# Patient Record
Sex: Female | Born: 1970
Health system: Southern US, Community
[De-identification: ages and names within clinical notes are randomized; demographics above are authoritative.]

## PROBLEM LIST (undated history)

## (undated) DIAGNOSIS — F329 Major depressive disorder, single episode, unspecified: Secondary | ICD-10-CM

## (undated) DIAGNOSIS — T7840XA Allergy, unspecified, initial encounter: Secondary | ICD-10-CM

## (undated) DIAGNOSIS — I10 Essential (primary) hypertension: Secondary | ICD-10-CM

## (undated) DIAGNOSIS — M79606 Pain in leg, unspecified: Secondary | ICD-10-CM

## (undated) DIAGNOSIS — Z87442 Personal history of urinary calculi: Secondary | ICD-10-CM

## (undated) DIAGNOSIS — N201 Calculus of ureter: Secondary | ICD-10-CM

## (undated) DIAGNOSIS — Z973 Presence of spectacles and contact lenses: Secondary | ICD-10-CM

## (undated) DIAGNOSIS — R350 Frequency of micturition: Secondary | ICD-10-CM

## (undated) DIAGNOSIS — K852 Alcohol induced acute pancreatitis without necrosis or infection: Secondary | ICD-10-CM

## (undated) DIAGNOSIS — J45909 Unspecified asthma, uncomplicated: Secondary | ICD-10-CM

## (undated) DIAGNOSIS — F32A Depression, unspecified: Secondary | ICD-10-CM

## (undated) DIAGNOSIS — R3129 Other microscopic hematuria: Secondary | ICD-10-CM

## (undated) DIAGNOSIS — M1990 Unspecified osteoarthritis, unspecified site: Secondary | ICD-10-CM

## (undated) HISTORY — PX: TUBAL LIGATION: SHX77

---

## 1992-01-17 HISTORY — PX: LAPAROSCOPIC CHOLECYSTECTOMY: SUR755

## 1996-01-17 HISTORY — PX: OTHER SURGICAL HISTORY: SHX169

## 1997-06-21 ENCOUNTER — Inpatient Hospital Stay (HOSPITAL_COMMUNITY): Admission: AD | Admit: 1997-06-21 | Discharge: 1997-06-21 | Payer: Self-pay | Admitting: Obstetrics and Gynecology

## 1997-10-24 ENCOUNTER — Inpatient Hospital Stay (HOSPITAL_COMMUNITY): Admission: AD | Admit: 1997-10-24 | Discharge: 1997-10-26 | Payer: Self-pay | Admitting: Obstetrics and Gynecology

## 1997-10-31 ENCOUNTER — Inpatient Hospital Stay (HOSPITAL_COMMUNITY): Admission: AD | Admit: 1997-10-31 | Discharge: 1997-10-31 | Payer: Self-pay | Admitting: Obstetrics and Gynecology

## 1999-08-08 ENCOUNTER — Encounter: Admission: RE | Admit: 1999-08-08 | Discharge: 1999-08-25 | Payer: Self-pay | Admitting: Family Medicine

## 1999-09-09 ENCOUNTER — Encounter: Admission: RE | Admit: 1999-09-09 | Discharge: 1999-09-09 | Payer: Self-pay | Admitting: Family Medicine

## 1999-09-09 ENCOUNTER — Encounter: Payer: Self-pay | Admitting: Family Medicine

## 1999-09-10 ENCOUNTER — Encounter: Admission: RE | Admit: 1999-09-10 | Discharge: 1999-09-10 | Payer: Self-pay | Admitting: Family Medicine

## 1999-09-10 ENCOUNTER — Encounter: Payer: Self-pay | Admitting: Family Medicine

## 1999-09-13 ENCOUNTER — Encounter: Payer: Self-pay | Admitting: Family Medicine

## 1999-09-13 ENCOUNTER — Encounter: Admission: RE | Admit: 1999-09-13 | Discharge: 1999-09-13 | Payer: Self-pay | Admitting: Family Medicine

## 1999-11-14 ENCOUNTER — Encounter: Payer: Self-pay | Admitting: Family Medicine

## 1999-11-14 ENCOUNTER — Encounter: Admission: RE | Admit: 1999-11-14 | Discharge: 1999-11-14 | Payer: Self-pay | Admitting: Family Medicine

## 2000-02-16 ENCOUNTER — Other Ambulatory Visit: Admission: RE | Admit: 2000-02-16 | Discharge: 2000-02-16 | Payer: Self-pay | Admitting: Family Medicine

## 2001-02-28 ENCOUNTER — Other Ambulatory Visit: Admission: RE | Admit: 2001-02-28 | Discharge: 2001-02-28 | Payer: Self-pay | Admitting: Family Medicine

## 2002-05-22 ENCOUNTER — Encounter: Admission: RE | Admit: 2002-05-22 | Discharge: 2002-05-22 | Payer: Self-pay | Admitting: Family Medicine

## 2002-05-22 ENCOUNTER — Encounter: Payer: Self-pay | Admitting: Family Medicine

## 2003-03-19 ENCOUNTER — Encounter: Admission: RE | Admit: 2003-03-19 | Discharge: 2003-03-19 | Payer: Self-pay | Admitting: Family Medicine

## 2003-03-22 ENCOUNTER — Emergency Department (HOSPITAL_COMMUNITY): Admission: EM | Admit: 2003-03-22 | Discharge: 2003-03-22 | Payer: Self-pay | Admitting: Emergency Medicine

## 2003-04-08 ENCOUNTER — Emergency Department (HOSPITAL_COMMUNITY): Admission: EM | Admit: 2003-04-08 | Discharge: 2003-04-09 | Payer: Self-pay

## 2003-06-30 ENCOUNTER — Other Ambulatory Visit: Admission: RE | Admit: 2003-06-30 | Discharge: 2003-06-30 | Payer: Self-pay | Admitting: Family Medicine

## 2004-06-15 ENCOUNTER — Emergency Department (HOSPITAL_COMMUNITY): Admission: EM | Admit: 2004-06-15 | Discharge: 2004-06-15 | Payer: Self-pay | Admitting: Emergency Medicine

## 2004-06-22 ENCOUNTER — Ambulatory Visit: Payer: Self-pay | Admitting: Internal Medicine

## 2004-06-27 ENCOUNTER — Encounter: Admission: RE | Admit: 2004-06-27 | Discharge: 2004-06-27 | Payer: Self-pay | Admitting: Family Medicine

## 2004-08-02 ENCOUNTER — Other Ambulatory Visit: Admission: RE | Admit: 2004-08-02 | Discharge: 2004-08-02 | Payer: Self-pay | Admitting: Family Medicine

## 2005-01-11 ENCOUNTER — Encounter: Admission: RE | Admit: 2005-01-11 | Discharge: 2005-01-11 | Payer: Self-pay | Admitting: Family Medicine

## 2005-09-11 ENCOUNTER — Other Ambulatory Visit: Admission: RE | Admit: 2005-09-11 | Discharge: 2005-09-11 | Payer: Self-pay | Admitting: Family Medicine

## 2006-01-05 ENCOUNTER — Other Ambulatory Visit (HOSPITAL_COMMUNITY): Admission: RE | Admit: 2006-01-05 | Discharge: 2006-04-05 | Payer: Self-pay | Admitting: Psychiatry

## 2006-01-05 ENCOUNTER — Ambulatory Visit: Payer: Self-pay | Admitting: Psychiatry

## 2006-07-09 ENCOUNTER — Other Ambulatory Visit (HOSPITAL_COMMUNITY): Admission: RE | Admit: 2006-07-09 | Discharge: 2006-10-07 | Payer: Self-pay | Admitting: Psychiatry

## 2006-07-11 ENCOUNTER — Ambulatory Visit: Payer: Self-pay | Admitting: Psychiatry

## 2007-05-29 ENCOUNTER — Other Ambulatory Visit: Admission: RE | Admit: 2007-05-29 | Discharge: 2007-05-29 | Payer: Self-pay | Admitting: Family Medicine

## 2007-06-05 ENCOUNTER — Encounter: Admission: RE | Admit: 2007-06-05 | Discharge: 2007-06-05 | Payer: Self-pay | Admitting: Family Medicine

## 2008-07-24 ENCOUNTER — Other Ambulatory Visit: Admission: RE | Admit: 2008-07-24 | Discharge: 2008-07-24 | Payer: Self-pay | Admitting: Family Medicine

## 2008-09-01 ENCOUNTER — Ambulatory Visit (HOSPITAL_COMMUNITY): Admission: RE | Admit: 2008-09-01 | Discharge: 2008-09-01 | Payer: Self-pay | Admitting: Orthopedic Surgery

## 2008-10-28 ENCOUNTER — Inpatient Hospital Stay (HOSPITAL_COMMUNITY): Admission: RE | Admit: 2008-10-28 | Discharge: 2008-10-31 | Payer: Self-pay | Admitting: *Deleted

## 2008-10-28 HISTORY — PX: POSTERIOR FUSION LUMBAR SPINE: SUR632

## 2009-05-06 ENCOUNTER — Ambulatory Visit (HOSPITAL_COMMUNITY): Admission: RE | Admit: 2009-05-06 | Discharge: 2009-05-06 | Payer: Self-pay | Admitting: Obstetrics and Gynecology

## 2009-05-06 HISTORY — PX: LAPAROSCOPIC TUBAL LIGATION: SUR803

## 2009-08-31 ENCOUNTER — Encounter: Admission: RE | Admit: 2009-08-31 | Discharge: 2009-08-31 | Payer: Self-pay | Admitting: *Deleted

## 2009-10-29 ENCOUNTER — Other Ambulatory Visit: Admission: RE | Admit: 2009-10-29 | Discharge: 2009-10-29 | Payer: Self-pay | Admitting: Family Medicine

## 2010-02-05 ENCOUNTER — Encounter: Payer: Self-pay | Admitting: Family Medicine

## 2010-02-06 ENCOUNTER — Encounter: Payer: Self-pay | Admitting: Orthopedic Surgery

## 2010-04-05 LAB — CBC
HCT: 40.1 % (ref 36.0–46.0)
Hemoglobin: 13.5 g/dL (ref 12.0–15.0)
MCV: 97 fL (ref 78.0–100.0)
Platelets: 225 10*3/uL (ref 150–400)
RBC: 4.13 MIL/uL (ref 3.87–5.11)
WBC: 10.6 10*3/uL — ABNORMAL HIGH (ref 4.0–10.5)

## 2010-04-21 LAB — DIFFERENTIAL
Basophils Relative: 0 % (ref 0–1)
Lymphocytes Relative: 6 % — ABNORMAL LOW (ref 12–46)
Lymphs Abs: 1.2 10*3/uL (ref 0.7–4.0)
Monocytes Relative: 2 % — ABNORMAL LOW (ref 3–12)
Neutro Abs: 19.5 10*3/uL — ABNORMAL HIGH (ref 1.7–7.7)
Neutrophils Relative %: 91 % — ABNORMAL HIGH (ref 43–77)

## 2010-04-21 LAB — BASIC METABOLIC PANEL
BUN: 9 mg/dL (ref 6–23)
BUN: <1 mg/dL — ABNORMAL LOW (ref 6–23)
CO2: 24 mEq/L (ref 19–32)
CO2: 28 mEq/L (ref 19–32)
Chloride: 104 mEq/L (ref 96–112)
Chloride: 105 mEq/L (ref 96–112)
Creatinine, Ser: 0.37 mg/dL — ABNORMAL LOW (ref 0.4–1.2)
Glucose, Bld: 111 mg/dL — ABNORMAL HIGH (ref 70–99)
Glucose, Bld: 81 mg/dL (ref 70–99)
Potassium: 4.4 mEq/L (ref 3.5–5.1)
Sodium: 138 mEq/L (ref 135–145)

## 2010-04-21 LAB — POCT I-STAT 4, (NA,K, GLUC, HGB,HCT)
Hemoglobin: 10.9 g/dL — ABNORMAL LOW (ref 12.0–15.0)
Potassium: 3.9 mEq/L (ref 3.5–5.1)
Sodium: 136 mEq/L (ref 135–145)

## 2010-04-21 LAB — URINE MICROSCOPIC-ADD ON

## 2010-04-21 LAB — URINALYSIS, ROUTINE W REFLEX MICROSCOPIC
Bilirubin Urine: NEGATIVE
Specific Gravity, Urine: 1.013 (ref 1.005–1.030)
Urobilinogen, UA: 1 mg/dL (ref 0.0–1.0)

## 2010-04-21 LAB — TYPE AND SCREEN: Antibody Screen: NEGATIVE

## 2010-04-21 LAB — PROTIME-INR: INR: 0.88 (ref 0.00–1.49)

## 2010-04-21 LAB — CBC: MCHC: 34.3 g/dL (ref 30.0–36.0)

## 2010-08-02 ENCOUNTER — Other Ambulatory Visit: Payer: Self-pay | Admitting: Family Medicine

## 2010-08-02 DIAGNOSIS — Z1231 Encounter for screening mammogram for malignant neoplasm of breast: Secondary | ICD-10-CM

## 2010-08-16 ENCOUNTER — Ambulatory Visit: Payer: Self-pay

## 2010-11-02 ENCOUNTER — Other Ambulatory Visit (HOSPITAL_COMMUNITY)
Admission: RE | Admit: 2010-11-02 | Discharge: 2010-11-02 | Disposition: A | Discharge status: Home / Self Care | Payer: Self-pay | Source: Ambulatory Visit | Attending: Family Medicine | Admitting: Family Medicine

## 2010-11-02 ENCOUNTER — Other Ambulatory Visit: Payer: Self-pay | Admitting: Family Medicine

## 2010-11-02 DIAGNOSIS — Z124 Encounter for screening for malignant neoplasm of cervix: Secondary | ICD-10-CM | POA: Insufficient documentation

## 2011-11-20 ENCOUNTER — Other Ambulatory Visit (HOSPITAL_COMMUNITY)
Admission: RE | Admit: 2011-11-20 | Discharge: 2011-11-20 | Disposition: A | Discharge status: Home / Self Care | Payer: BC Managed Care – PPO | Source: Ambulatory Visit | Attending: Family Medicine | Admitting: Family Medicine

## 2011-11-20 ENCOUNTER — Other Ambulatory Visit: Payer: Self-pay | Admitting: Family Medicine

## 2011-11-20 DIAGNOSIS — Z124 Encounter for screening for malignant neoplasm of cervix: Secondary | ICD-10-CM | POA: Insufficient documentation

## 2012-11-21 ENCOUNTER — Other Ambulatory Visit (HOSPITAL_COMMUNITY)
Admission: RE | Admit: 2012-11-21 | Discharge: 2012-11-21 | Disposition: A | Discharge status: Home / Self Care | Payer: BC Managed Care – PPO | Source: Ambulatory Visit | Attending: Family Medicine | Admitting: Family Medicine

## 2012-11-21 ENCOUNTER — Other Ambulatory Visit: Payer: Self-pay | Admitting: Family Medicine

## 2012-11-21 DIAGNOSIS — Z124 Encounter for screening for malignant neoplasm of cervix: Secondary | ICD-10-CM | POA: Insufficient documentation

## 2012-12-03 ENCOUNTER — Emergency Department (HOSPITAL_COMMUNITY)
Admission: EM | Admit: 2012-12-03 | Discharge: 2012-12-04 | Disposition: A | Discharge status: Home / Self Care | Payer: BC Managed Care – PPO | Attending: Emergency Medicine | Admitting: Emergency Medicine

## 2012-12-03 ENCOUNTER — Emergency Department (HOSPITAL_COMMUNITY): Payer: BC Managed Care – PPO

## 2012-12-03 ENCOUNTER — Encounter (HOSPITAL_COMMUNITY): Payer: Self-pay | Admitting: Emergency Medicine

## 2012-12-03 DIAGNOSIS — Z87891 Personal history of nicotine dependence: Secondary | ICD-10-CM | POA: Insufficient documentation

## 2012-12-03 DIAGNOSIS — Z87442 Personal history of urinary calculi: Secondary | ICD-10-CM | POA: Insufficient documentation

## 2012-12-03 DIAGNOSIS — J45909 Unspecified asthma, uncomplicated: Secondary | ICD-10-CM | POA: Insufficient documentation

## 2012-12-03 DIAGNOSIS — K859 Acute pancreatitis without necrosis or infection, unspecified: Secondary | ICD-10-CM | POA: Insufficient documentation

## 2012-12-03 DIAGNOSIS — R109 Unspecified abdominal pain: Secondary | ICD-10-CM | POA: Insufficient documentation

## 2012-12-03 HISTORY — DX: Unspecified asthma, uncomplicated: J45.909

## 2012-12-03 LAB — CBC
HCT: 40.4 % (ref 36.0–46.0)
MCH: 32.6 pg (ref 26.0–34.0)
MCHC: 35.4 g/dL (ref 30.0–36.0)
Platelets: 314 10*3/uL (ref 150–400)
RBC: 4.39 MIL/uL (ref 3.87–5.11)

## 2012-12-03 LAB — COMPREHENSIVE METABOLIC PANEL
AST: 27 U/L (ref 0–37)
Albumin: 3.9 g/dL (ref 3.5–5.2)
BUN: 12 mg/dL (ref 6–23)
CO2: 24 mEq/L (ref 19–32)
Calcium: 9.4 mg/dL (ref 8.4–10.5)
Creatinine, Ser: 0.47 mg/dL — ABNORMAL LOW (ref 0.50–1.10)
GFR calc non Af Amer: >90 mL/min (ref >90)

## 2012-12-03 LAB — LIPASE, BLOOD: Lipase: 194 U/L — ABNORMAL HIGH (ref 11–59)

## 2012-12-03 LAB — D-DIMER, QUANTITATIVE: D-Dimer, Quant: <0.27 ug/mL-FEU (ref 0.00–0.48)

## 2012-12-03 LAB — POCT I-STAT TROPONIN I: Troponin i, poc: 0 ng/mL (ref 0.00–0.08)

## 2012-12-03 MED ORDER — MORPHINE SULFATE 4 MG/ML IJ SOLN
4.0000 mg | Freq: Once | INTRAMUSCULAR | Status: AC
  Administered 2012-12-03: 4 mg via INTRAVENOUS
  Filled 2012-12-03: qty 1

## 2012-12-03 NOTE — ED Provider Notes (Signed)
CSN: [id number]     Arrival date & time 12/03/12  2204 History   First MD Initiated Contact with Patient 12/03/12 2302     Chief Complaint  Patient presents with  . Chest Pain  . Abdominal Pain   (Consider location/radiation/quality/duration/timing/severity/associated sxs/prior Treatment) Patient is a 42 y.o. female presenting with chest pain and abdominal pain. The history is provided by the patient.  Chest Pain Associated symptoms: abdominal pain   Abdominal Pain Associated symptoms: chest pain   She had onset at about 9 PM of sharp left anterior lower chest pain without radiation. Pain has gradually gotten worse and she now considers it severe. She rates pain at 7/10. Pain is worse with taking a deep breath but not with movement. There is no associated nausea, vomiting, diaphoresis. There is mild dyspnea. Nothing makes the pain any better. She has not taken any medication to try and help it. She has not had pain like this before. She is a former smoker having quit 10 months ago. There is no history of hypertension, diabetes, hyperlipidemia. There's no family history of premature coronary atherosclerosis. She denies recent surgery, long distance travel. She denies any leg pain or swelling.  Past Medical History  Diagnosis Date  . Asthma   . Kidney stone    Past Surgical History  Procedure Laterality Date  . Cholecystectomy    . Back surgery    . Kidney stone surgery     History reviewed. No pertinent family history. History  Substance Use Topics  . Smoking status: Former Smoker    Quit date: 02/03/2012  . Smokeless tobacco: Not on file  . Alcohol Use: Yes     Comment: 12 oz beer per day   OB History   Grav Para Term Preterm Abortions TAB SAB Ect Mult Living                 Review of Systems  Cardiovascular: Positive for chest pain.  Gastrointestinal: Positive for abdominal pain.  All other systems reviewed and are negative.    Allergies  Aspirin and  Ibuprofen  Home Medications  No current outpatient prescriptions on file. BP 146/102  Pulse 87  Temp(Src) 97.5 F (36.4 C) (Oral)  Resp 23  Ht 5' 1.5" (1.562 m)  Wt 138 lb 6 oz (62.766 kg)  BMI 25.73 kg/m2  SpO2 94% Physical Exam  Nursing note and vitals reviewed.  42 year old female, who is emotionally labile, but is in no acute distress. Vital signs are significant for tachypnea with respiratory rate of 23, and hypertension with blood pressure 146/102. Oxygen saturation is 94%, which is normal. Head is normocephalic and atraumatic. PERRLA, EOMI. Oropharynx is clear. Neck is nontender and supple without adenopathy or JVD. Back is nontender and there is no CVA tenderness. Lungs are clear without rales, wheezes, or rhonchi. Chest is nontender. Heart has regular rate and rhythm without murmur. Abdomen is soft, flat, nontender without masses or hepatosplenomegaly and peristalsis is normoactive. Extremities have no cyanosis or edema, full range of motion is present. Skin is warm and dry without rash. Neurologic: Mental status is normal, cranial nerves are intact, there are no motor or sensory deficits.  ED Course  Procedures (including critical care time) Labs Review Results for orders placed during the hospital encounter of 12/03/12  CBC      Result Value Range   WBC 10.2  4.0 - 10.5 K/uL   RBC 4.39  3.87 - 5.11 MIL/uL   Hemoglobin 14.3  12.0 - 15.0 g/dL   HCT 62.1  30.8 - 65.7 %   MCV 92.0  78.0 - 100.0 fL   MCH 32.6  26.0 - 34.0 pg   MCHC 35.4  30.0 - 36.0 g/dL   RDW 84.6  96.2 - 95.2 %   Platelets 314  150 - 400 K/uL  LIPASE, BLOOD      Result Value Range   Lipase 194 (*) 11 - 59 U/L  COMPREHENSIVE METABOLIC PANEL      Result Value Range   Sodium 141  135 - 145 mEq/L   Potassium 3.6  3.5 - 5.1 mEq/L   Chloride 107  96 - 112 mEq/L   CO2 24  19 - 32 mEq/L   Glucose, Bld 77  70 - 99 mg/dL   BUN 12  6 - 23 mg/dL   Creatinine, Ser 8.41 (*) 0.50 - 1.10 mg/dL   Calcium  9.4  8.4 - 32.4 mg/dL   Total Protein 7.5  6.0 - 8.3 g/dL   Albumin 3.9  3.5 - 5.2 g/dL   AST 27  0 - 37 U/L   ALT 15  0 - 35 U/L   Alkaline Phosphatase 62  39 - 117 U/L   Total Bilirubin 0.2 (*) 0.3 - 1.2 mg/dL   GFR calc non Af Amer >90  >90 mL/min   GFR calc Af Amer >90  >90 mL/min  D-DIMER, QUANTITATIVE      Result Value Range   D-Dimer, Quant <0.27  0.00 - 0.48 ug/mL-FEU  POCT I-STAT TROPONIN I      Result Value Range   Troponin i, poc 0.00  0.00 - 0.08 ng/mL   Comment 3            Imaging Review Dg Chest 2 View  12/03/2012   CLINICAL DATA:  Chest and abdominal pain  EXAM: CHEST  2 VIEW  COMPARISON:  10/28/2008  FINDINGS: The heart size and mediastinal contours are within normal limits. Both lungs are clear. The visualized skeletal structures are unremarkable. Cholecystectomy changes.  IMPRESSION: No active cardiopulmonary disease.   Electronically Signed   By: Tiburcio Pea M.D.   On: 12/03/2012 23:47    EKG Interpretation    Date/Time:  Tuesday December 03 2012 22:11:09 EST Ventricular Rate:  80 PR Interval:  146 QRS Duration: 92 QT Interval:  394 QTC Calculation: 454 R Axis:   76 Text Interpretation:  Normal sinus rhythm Left ventricular hypertrophy Abnormal ECG No significant change since last tracing Confirmed by [facility]  MD, Jennipher Weatherholtz ([id number]) on 12/03/2012 11:03:43 PM            MDM   1. Pancreatitis    Pleuritic chest pain of uncertain cause. Chest x-ray and screening labs will be obtained. She is aspirin and NSAIDs allergic associates not given aspirin or ketorolac. She's given morphine for pain. ECG shows no evidence of ischemia and she has no significant cardiac risk factors. She also has no risk factors for DVT or pulmonary embolism. Past records are reviewed and she has no relevant past visits.  Workup has come back significant for elevated lipase. Patient is to drinking 3 or 4 times per week and consuming 3 or 4 drinks each time. D-dimer has come back  normal as well as chest x-ray and remaining laboratory with workup. Cause of pain seems to be alcoholic pancreatitis. This was explained to the patient. She got moderate relief of pain with morphine. She'll be given a dose of  hydromorphone. Since she is not having significant nausea, I'm hoping to send her home with appropriate analgesics.  She got good relief of pain with morphine. She's not had any difficulty with nausea. She is discharged with prescriptions for pantoprazole, ondansetron, and oxycodone-acetaminophen. She is advised to abstain from alcohol.  Dione Booze, MD 12/04/12 [phone]

## 2012-12-03 NOTE — ED Notes (Signed)
MD at bedside. 

## 2012-12-03 NOTE — ED Notes (Addendum)
Reports sudden L sided CP and abd starting 20 minutes ago; denies n/v; " a little bit" of SOB; denies diaphoresis; pt reports drinking liquor today; pts breath smells of etoh

## 2012-12-04 MED ORDER — ONDANSETRON HCL 4 MG/2ML IJ SOLN
4.0000 mg | Freq: Once | INTRAMUSCULAR | Status: AC
  Administered 2012-12-04: 4 mg via INTRAVENOUS
  Filled 2012-12-04: qty 2

## 2012-12-04 MED ORDER — PANTOPRAZOLE SODIUM 40 MG PO TBEC
40.0000 mg | DELAYED_RELEASE_TABLET | Freq: Once | ORAL | Status: AC
  Administered 2012-12-04: 40 mg via ORAL
  Filled 2012-12-04: qty 1

## 2012-12-04 MED ORDER — OXYCODONE-ACETAMINOPHEN 5-325 MG PO TABS: 1.0000 | ORAL_TABLET | ORAL | Status: DC | PRN

## 2012-12-04 MED ORDER — ALBUTEROL SULFATE (5 MG/ML) 0.5% IN NEBU
5.0000 mg | INHALATION_SOLUTION | Freq: Once | RESPIRATORY_TRACT | Status: AC
  Administered 2012-12-04: 5 mg via RESPIRATORY_TRACT
  Filled 2012-12-04: qty 1

## 2012-12-04 MED ORDER — HYDROMORPHONE HCL PF 1 MG/ML IJ SOLN
1.0000 mg | Freq: Once | INTRAMUSCULAR | Status: AC
  Administered 2012-12-04: 1 mg via INTRAVENOUS
  Filled 2012-12-04: qty 1

## 2012-12-04 MED ORDER — PANTOPRAZOLE SODIUM 40 MG PO TBEC: 40.0000 mg | DELAYED_RELEASE_TABLET | Freq: Every day | ORAL | Status: DC

## 2012-12-04 MED ORDER — ONDANSETRON HCL 4 MG PO TABS: 4.0000 mg | ORAL_TABLET | Freq: Four times a day (QID) | ORAL | Status: DC | PRN

## 2012-12-04 MED ORDER — IPRATROPIUM BROMIDE 0.02 % IN SOLN
0.5000 mg | Freq: Once | RESPIRATORY_TRACT | Status: AC
  Administered 2012-12-04: 0.5 mg via RESPIRATORY_TRACT
  Filled 2012-12-04: qty 2.5

## 2012-12-06 ENCOUNTER — Other Ambulatory Visit: Payer: Self-pay | Admitting: Family Medicine

## 2012-12-06 DIAGNOSIS — R1011 Right upper quadrant pain: Secondary | ICD-10-CM

## 2012-12-11 ENCOUNTER — Inpatient Hospital Stay: Admission: RE | Admit: 2012-12-11 | Payer: BC Managed Care – PPO | Source: Ambulatory Visit

## 2012-12-16 ENCOUNTER — Other Ambulatory Visit: Payer: BC Managed Care – PPO

## 2012-12-18 ENCOUNTER — Ambulatory Visit
Admission: RE | Admit: 2012-12-18 | Discharge: 2012-12-18 | Disposition: A | Discharge status: Home / Self Care | Payer: BC Managed Care – PPO | Source: Ambulatory Visit | Attending: Family Medicine | Admitting: Family Medicine

## 2012-12-18 DIAGNOSIS — R1011 Right upper quadrant pain: Secondary | ICD-10-CM

## 2013-03-24 ENCOUNTER — Other Ambulatory Visit (HOSPITAL_COMMUNITY): Payer: Self-pay | Admitting: Family Medicine

## 2013-03-24 DIAGNOSIS — J45909 Unspecified asthma, uncomplicated: Secondary | ICD-10-CM

## 2013-03-24 LAB — PULMONARY FUNCTION TEST
DL/VA % pred: 115 %
DL/VA: 5.24 ml/min/mmHg/L
DLCO COR % PRED: 114 %
DLCO COR: 24.61 ml/min/mmHg
DLCO UNC % PRED: 114 %
DLCO UNC: 24.61 ml/min/mmHg
FEF 25-75 POST: 3.03 L/s
FEF 25-75 Pre: 1.86 L/sec
FEF2575-%Change-Post: 62 %
FEF2575-%PRED-PRE: 64 %
FEF2575-%Pred-Post: 105 %
FEV1-%Change-Post: 12 %
FEV1-%Pred-Post: 103 %
FEV1-%Pred-Pre: 91 %
FEV1-POST: 2.84 L
FEV1-PRE: 2.52 L
FEV1FVC-%Change-Post: 12 %
FEV1FVC-%PRED-PRE: 88 %
FEV6-%CHANGE-POST: 0 %
FEV6-%Pred-Post: 105 %
FEV6-%Pred-Pre: 104 %
FEV6-POST: 3.51 L
FEV6-Pre: 3.49 L
FEV6FVC-%CHANGE-POST: 0 %
FEV6FVC-%Pred-Post: 102 %
FEV6FVC-%Pred-Pre: 101 %
FVC-%CHANGE-POST: 0 %
FVC-%PRED-POST: 103 %
FVC-%Pred-Pre: 103 %
FVC-PRE: 3.51 L
FVC-Post: 3.51 L
PRE FEV1/FVC RATIO: 72 %
Post FEV1/FVC ratio: 81 %
Post FEV6/FVC ratio: 100 %
Pre FEV6/FVC Ratio: 99 %
RV % pred: 101 %
RV: 1.6 L
TLC % pred: 104 %
TLC: 4.96 L

## 2013-04-01 ENCOUNTER — Encounter (HOSPITAL_COMMUNITY): Payer: BC Managed Care – PPO

## 2013-04-04 ENCOUNTER — Encounter (HOSPITAL_COMMUNITY): Payer: BC Managed Care – PPO

## 2013-04-09 ENCOUNTER — Ambulatory Visit (HOSPITAL_COMMUNITY)
Admission: RE | Admit: 2013-04-09 | Discharge: 2013-04-09 | Disposition: A | Discharge status: Home / Self Care | Payer: BC Managed Care – PPO | Source: Ambulatory Visit | Attending: Family Medicine | Admitting: Family Medicine

## 2013-04-09 DIAGNOSIS — J45909 Unspecified asthma, uncomplicated: Secondary | ICD-10-CM | POA: Insufficient documentation

## 2013-04-09 MED ORDER — ALBUTEROL SULFATE (2.5 MG/3ML) 0.083% IN NEBU
2.5000 mg | INHALATION_SOLUTION | Freq: Once | RESPIRATORY_TRACT | Status: AC
  Administered 2013-04-09: 2.5 mg via RESPIRATORY_TRACT

## 2014-11-26 ENCOUNTER — Other Ambulatory Visit: Payer: Self-pay

## 2014-11-26 DIAGNOSIS — Z1231 Encounter for screening mammogram for malignant neoplasm of breast: Secondary | ICD-10-CM

## 2014-12-16 ENCOUNTER — Ambulatory Visit
Admission: RE | Admit: 2014-12-16 | Discharge: 2014-12-16 | Disposition: A | Discharge status: Home / Self Care | Payer: BLUE CROSS/BLUE SHIELD | Source: Ambulatory Visit

## 2014-12-16 DIAGNOSIS — Z1231 Encounter for screening mammogram for malignant neoplasm of breast: Secondary | ICD-10-CM

## 2014-12-21 ENCOUNTER — Other Ambulatory Visit: Payer: Self-pay | Admitting: Family Medicine

## 2014-12-21 DIAGNOSIS — R928 Other abnormal and inconclusive findings on diagnostic imaging of breast: Secondary | ICD-10-CM

## 2014-12-25 ENCOUNTER — Other Ambulatory Visit: Payer: BLUE CROSS/BLUE SHIELD

## 2014-12-28 ENCOUNTER — Other Ambulatory Visit: Payer: Self-pay | Admitting: Family Medicine

## 2014-12-28 ENCOUNTER — Ambulatory Visit
Admission: RE | Admit: 2014-12-28 | Discharge: 2014-12-28 | Disposition: A | Discharge status: Home / Self Care | Payer: BLUE CROSS/BLUE SHIELD | Source: Ambulatory Visit | Attending: Family Medicine | Admitting: Family Medicine

## 2014-12-28 DIAGNOSIS — R928 Other abnormal and inconclusive findings on diagnostic imaging of breast: Secondary | ICD-10-CM

## 2014-12-29 ENCOUNTER — Other Ambulatory Visit: Payer: Self-pay | Admitting: Family Medicine

## 2014-12-29 DIAGNOSIS — R928 Other abnormal and inconclusive findings on diagnostic imaging of breast: Secondary | ICD-10-CM

## 2015-01-01 ENCOUNTER — Ambulatory Visit
Admission: RE | Admit: 2015-01-01 | Discharge: 2015-01-01 | Disposition: A | Discharge status: Home / Self Care | Payer: BLUE CROSS/BLUE SHIELD | Source: Ambulatory Visit | Attending: Family Medicine | Admitting: Family Medicine

## 2015-01-01 DIAGNOSIS — R928 Other abnormal and inconclusive findings on diagnostic imaging of breast: Secondary | ICD-10-CM

## 2015-02-04 ENCOUNTER — Other Ambulatory Visit: Payer: Self-pay | Admitting: Family Medicine

## 2015-02-04 ENCOUNTER — Ambulatory Visit
Admission: RE | Admit: 2015-02-04 | Discharge: 2015-02-04 | Disposition: A | Discharge status: Home / Self Care | Payer: BLUE CROSS/BLUE SHIELD | Source: Ambulatory Visit | Attending: Family Medicine | Admitting: Family Medicine

## 2015-02-04 DIAGNOSIS — R1031 Right lower quadrant pain: Secondary | ICD-10-CM

## 2015-02-09 ENCOUNTER — Encounter (HOSPITAL_BASED_OUTPATIENT_CLINIC_OR_DEPARTMENT_OTHER): Payer: Self-pay | Admitting: *Deleted

## 2015-02-09 ENCOUNTER — Other Ambulatory Visit: Payer: Self-pay | Admitting: Urology

## 2015-02-09 NOTE — Progress Notes (Signed)
NPO AFTER MN W/ EXCEPTION CLEAR LIQUIDS UNTIL 0700 (NO CREAM /MILK PRODUCTS).   ARRIVE AT 1130.  NEEDS HG .   WILL TAKE AM MEDS AND DO SYMBICORT INHALER AM DOS W/ SIPS OF WATER AND IF NEEDED TAKE NORCO.

## 2015-02-11 NOTE — H&P (Signed)
Reason For Visit Kidney stone   Active Problems Problems  1. Calculus of kidney (N20.0) 2. Microscopic hematuria (R31.29) 3. Right ureteral stone (N20.1)  History of Present Illness     2nd stone event for this 45 yo female, referred by Dr. Clovis Riley for further evaluation of kidney stones.    She noted 1 month intermittant having Rt flank pain, with intense pain yesterday, urgency, frequency ( 1 week) & N/V with a hx of kidney stones. She had a CT 02/04/15 that showed: 6 mm Rt distal ureteral stone with moderate hydronephrosis, 2 mm Rt renal stone and a 2 mm Lt renal stone.    Unknown what type of kidney stones she had in the past. Sodas: Dt. Pepsi: 5/day, 16 oz ea. No tea.   Fast foods: 3-4x/week.   Family hx. Mother ++ stones.  Tobacco: 27pk/ yr tobacco.   Surgical History Problems  1. History of Back Surgery 2. History of Cholecystectomy 3. History of Cystoscopy With Manipulation Of Ureteral Calculus  Current Meds 1. Hydrocodone-Acetaminophen CAPS;  Therapy: (Recorded:20Jan2017) to Recorded 2. PROzac 40 MG Oral Capsule;  Therapy: (Recorded:20Jan2017) to Recorded 3. Singulair TABS;  Therapy: (Recorded:20Jan2017) to Recorded 4. Symbicort AERO;  Therapy: (Recorded:20Jan2017) to Recorded 5. ZyrTEC Allergy 10 MG Oral Tablet;  Therapy: (Recorded:20Jan2017) to Recorded  Allergies Medication  1. Aspirin TABS 2. Ibuprofen TABS  Family History Problems  1. Family history of cardiac disorder (Z82.49) : Mother 2. Family history of diabetes mellitus (Z83.3) : Mother 3. Family history of hypertension (Z82.49) : Father, Sister  Social History Problems    Alcohol use (Z78.9)   Caffeine use (F15.90)   Current every day smoker (F17.200)   Housewife or homemaker   Number of children   Smokes cigarettes (F17.210)  Review of Systems Genitourinary, constitutional, skin, eye, otolaryngeal, hematologic/lymphatic, cardiovascular, pulmonary, endocrine, musculoskeletal,  gastrointestinal, neurological and psychiatric system(s) were reviewed and pertinent findings if present are noted and are otherwise negative.  Genitourinary: urinary frequency and urinary urgency.  Gastrointestinal: nausea, vomiting and flank pain.    Vitals Vital Signs [Data Includes: Last 1 Day]  Recorded: 20Jan2017 01:46PM  Height: 5 ft 1.5 in Weight: 134 lb  BMI Calculated: 24.91 BSA Calculated: 1.6 Blood Pressure: 161 / 84 Temperature: 97.5 F Heart Rate: 84  Physical Exam Constitutional: Well nourished and well developed . No acute distress.  ENT:. The ears and nose are normal in appearance.  Neck: The appearance of the neck is normal and no neck mass is present.  Pulmonary: No respiratory distress and normal respiratory rhythm and effort.  Cardiovascular: Heart rate and rhythm are normal . No peripheral edema.  Abdomen: The abdomen is soft and nontender. No masses are palpated. Mild. mild right CVA tenderness no CVA tenderness. No hernias are palpable. No hepatosplenomegaly noted.  Genitourinary:  Chaperone Present: .  Examination of the external genitalia shows normal female external genitalia and no lesions. The urethra is normal in appearance and not tender. There is no urethral mass. Vaginal exam demonstrates no abnormalities. The adnexa are palpably normal. The bladder is non tender and not distended. The anus is normal on inspection. The perineum is normal on inspection.  Lymphatics: The femoral and inguinal nodes are not enlarged or tender.  Skin: Normal skin turgor, no visible rash and no visible skin lesions.  Neuro/Psych:. Mood and affect are appropriate.    Results/Data  Selected Results  KUB 20Jan2017 01:56PM Jethro Bolus   Test Name Result Flag Reference  KUB  Diagnostic Images Are Available In PACS For This Exam.    05 Feb 2015 1:19 PM  UA With REFLEX    COLOR YELLOW     APPEARANCE CLEAR     SPECIFIC GRAVITY 1.025     pH 6.0     GLUCOSE  NEGATIVE     BILIRUBIN NEGATIVE     KETONE NEGATIVE     BLOOD 2+     PROTEIN NEGATIVE     NITRITE NEGATIVE     LEUKOCYTE ESTERASE NEGATIVE     WBC 0-5     RBC 3-10     SQUAMOUS EPITHELIAL/HPF 0-5     BACTERIA NONE SEEN     CRYSTALS NONE SEEN     CASTS NONE SEEN     Yeast NONE SEEN  Assessment Assessed  1. Microscopic hematuria (R31.29) 2. Right ureteral stone (N20.1) 3. Calculus of kidney (N20.0)  Mrs Cambre has a 6mm distal stone, without infection or gross hematuria, or colic at this time. She has mild hydronephrosis. She has pain med at home. Cannot take Tylenol or Nsaid ( anaphylaxis). She will strain urine and RTC next week for re-ck. We have discussed elective basket extraction of stone if she does not pass her stone.   Plan Calculus of kidney  1. KUB; Status:Resulted - Requires Verification;   Done: 20Jan2017 01:56PM  Elective stone extraction.   Return to [facility] ED if necessary over weekend for surgery for stone.   Discussion/Summary cc: Lupe Carney, MD     Signatures Electronically signed by : Jethro Bolus, M.D.; Feb 05 2015  3:33PM EST

## 2015-02-12 ENCOUNTER — Encounter (HOSPITAL_BASED_OUTPATIENT_CLINIC_OR_DEPARTMENT_OTHER): Payer: Self-pay | Admitting: *Deleted

## 2015-02-12 ENCOUNTER — Encounter (HOSPITAL_BASED_OUTPATIENT_CLINIC_OR_DEPARTMENT_OTHER)
Admission: RE | Disposition: A | Discharge status: Home / Self Care | Payer: Self-pay | Source: Ambulatory Visit | Attending: Urology

## 2015-02-12 ENCOUNTER — Ambulatory Visit (HOSPITAL_BASED_OUTPATIENT_CLINIC_OR_DEPARTMENT_OTHER): Payer: BLUE CROSS/BLUE SHIELD | Admitting: Certified Registered"

## 2015-02-12 ENCOUNTER — Ambulatory Visit (HOSPITAL_BASED_OUTPATIENT_CLINIC_OR_DEPARTMENT_OTHER)
Admission: RE | Admit: 2015-02-12 | Discharge: 2015-02-12 | Disposition: A | Discharge status: Home / Self Care | Payer: BLUE CROSS/BLUE SHIELD | Source: Ambulatory Visit | Attending: Urology | Admitting: Urology

## 2015-02-12 DIAGNOSIS — Z7982 Long term (current) use of aspirin: Secondary | ICD-10-CM | POA: Diagnosis not present

## 2015-02-12 DIAGNOSIS — F17210 Nicotine dependence, cigarettes, uncomplicated: Secondary | ICD-10-CM | POA: Insufficient documentation

## 2015-02-12 DIAGNOSIS — Z9049 Acquired absence of other specified parts of digestive tract: Secondary | ICD-10-CM | POA: Diagnosis not present

## 2015-02-12 DIAGNOSIS — J45909 Unspecified asthma, uncomplicated: Secondary | ICD-10-CM | POA: Diagnosis not present

## 2015-02-12 DIAGNOSIS — Z79899 Other long term (current) drug therapy: Secondary | ICD-10-CM | POA: Diagnosis not present

## 2015-02-12 DIAGNOSIS — N132 Hydronephrosis with renal and ureteral calculous obstruction: Secondary | ICD-10-CM | POA: Diagnosis present

## 2015-02-12 DIAGNOSIS — N201 Calculus of ureter: Secondary | ICD-10-CM

## 2015-02-12 DIAGNOSIS — M1990 Unspecified osteoarthritis, unspecified site: Secondary | ICD-10-CM | POA: Diagnosis not present

## 2015-02-12 HISTORY — DX: Frequency of micturition: R35.0

## 2015-02-12 HISTORY — DX: Personal history of urinary calculi: Z87.442

## 2015-02-12 HISTORY — DX: Major depressive disorder, single episode, unspecified: F32.9

## 2015-02-12 HISTORY — DX: Calculus of ureter: N20.1

## 2015-02-12 HISTORY — DX: Depression, unspecified: F32.A

## 2015-02-12 HISTORY — DX: Presence of spectacles and contact lenses: Z97.3

## 2015-02-12 HISTORY — DX: Other microscopic hematuria: R31.29

## 2015-02-12 HISTORY — DX: Unspecified osteoarthritis, unspecified site: M19.90

## 2015-02-12 HISTORY — PX: CYSTOSCOPY WITH RETROGRADE PYELOGRAM, URETEROSCOPY AND STENT PLACEMENT: SHX5789

## 2015-02-12 HISTORY — PX: STONE EXTRACTION WITH BASKET: SHX5318

## 2015-02-12 LAB — POCT HEMOGLOBIN-HEMACUE: Hemoglobin: 13.6 g/dL (ref 12.0–15.0)

## 2015-02-12 SURGERY — CYSTOURETEROSCOPY, WITH RETROGRADE PYELOGRAM AND STENT INSERTION
Anesthesia: General | Laterality: Right

## 2015-02-12 MED ORDER — HYOSCYAMINE SULFATE 0.125 MG SL SUBL
SUBLINGUAL_TABLET | SUBLINGUAL | Status: AC
  Filled 2015-02-12: qty 1

## 2015-02-12 MED ORDER — CEFAZOLIN SODIUM-DEXTROSE 2-3 GM-% IV SOLR
INTRAVENOUS | Status: AC
  Filled 2015-02-12: qty 50

## 2015-02-12 MED ORDER — PROMETHAZINE HCL 25 MG/ML IJ SOLN
6.2500 mg | INTRAMUSCULAR | Status: DC | PRN
  Filled 2015-02-12: qty 1

## 2015-02-12 MED ORDER — ONDANSETRON HCL 4 MG/2ML IJ SOLN
INTRAMUSCULAR | Status: DC | PRN
  Administered 2015-02-12: 4 mg via INTRAVENOUS

## 2015-02-12 MED ORDER — MIDAZOLAM HCL 2 MG/2ML IJ SOLN
INTRAMUSCULAR | Status: AC
  Filled 2015-02-12: qty 2

## 2015-02-12 MED ORDER — PROPOFOL 10 MG/ML IV BOLUS
INTRAVENOUS | Status: AC
  Filled 2015-02-12: qty 20

## 2015-02-12 MED ORDER — OXYCODONE HCL 5 MG PO TABS
5.0000 mg | ORAL_TABLET | Freq: Once | ORAL | Status: AC
  Administered 2015-02-12: 5 mg via ORAL
  Filled 2015-02-12: qty 1

## 2015-02-12 MED ORDER — MIDAZOLAM HCL 5 MG/5ML IJ SOLN
INTRAMUSCULAR | Status: DC | PRN
  Administered 2015-02-12: 2 mg via INTRAVENOUS

## 2015-02-12 MED ORDER — LIDOCAINE HCL (CARDIAC) 20 MG/ML IV SOLN
INTRAVENOUS | Status: AC
  Filled 2015-02-12: qty 5

## 2015-02-12 MED ORDER — PHENAZOPYRIDINE HCL 100 MG PO TABS
ORAL_TABLET | ORAL | Status: AC
  Filled 2015-02-12: qty 2

## 2015-02-12 MED ORDER — IOHEXOL 350 MG/ML SOLN
INTRAVENOUS | Status: DC | PRN
  Administered 2015-02-12: 5 mL via URETHRAL

## 2015-02-12 MED ORDER — PROPOFOL 10 MG/ML IV BOLUS
INTRAVENOUS | Status: DC | PRN
  Administered 2015-02-12: 180 mg via INTRAVENOUS

## 2015-02-12 MED ORDER — DEXAMETHASONE SODIUM PHOSPHATE 4 MG/ML IJ SOLN
INTRAMUSCULAR | Status: DC | PRN
  Administered 2015-02-12: 8 mg via INTRAVENOUS

## 2015-02-12 MED ORDER — HYOSCYAMINE SULFATE 0.125 MG SL SUBL
0.1250 mg | SUBLINGUAL_TABLET | Freq: Once | SUBLINGUAL | Status: AC
  Administered 2015-02-12: 0.125 mg via SUBLINGUAL
  Filled 2015-02-12: qty 1

## 2015-02-12 MED ORDER — DEXAMETHASONE SODIUM PHOSPHATE 10 MG/ML IJ SOLN
INTRAMUSCULAR | Status: AC
  Filled 2015-02-12: qty 1

## 2015-02-12 MED ORDER — HYDROMORPHONE HCL 1 MG/ML IJ SOLN
0.2500 mg | INTRAMUSCULAR | Status: DC | PRN
  Administered 2015-02-12 (×4): 0.5 mg via INTRAVENOUS
  Filled 2015-02-12: qty 1

## 2015-02-12 MED ORDER — FENTANYL CITRATE (PF) 100 MCG/2ML IJ SOLN
INTRAMUSCULAR | Status: DC | PRN
Start: 2015-02-12 — End: 2015-02-12
  Administered 2015-02-12 (×2): 50 ug via INTRAVENOUS

## 2015-02-12 MED ORDER — FENTANYL CITRATE (PF) 100 MCG/2ML IJ SOLN
INTRAMUSCULAR | Status: AC
  Filled 2015-02-12: qty 2

## 2015-02-12 MED ORDER — HYDROMORPHONE HCL 1 MG/ML IJ SOLN
INTRAMUSCULAR | Status: AC
  Filled 2015-02-12: qty 1

## 2015-02-12 MED ORDER — CEFAZOLIN SODIUM-DEXTROSE 2-3 GM-% IV SOLR
2.0000 g | INTRAVENOUS | Status: AC
  Administered 2015-02-12: 2 g via INTRAVENOUS
  Filled 2015-02-12: qty 50

## 2015-02-12 MED ORDER — ACETAMINOPHEN 10 MG/ML IV SOLN
INTRAVENOUS | Status: AC
  Filled 2015-02-12: qty 100

## 2015-02-12 MED ORDER — LACTATED RINGERS IV SOLN
INTRAVENOUS | Status: DC
  Administered 2015-02-12 (×2): via INTRAVENOUS
  Filled 2015-02-12: qty 1000

## 2015-02-12 MED ORDER — TRAMADOL-ACETAMINOPHEN 37.5-325 MG PO TABS: 1.0000 | ORAL_TABLET | Freq: Four times a day (QID) | ORAL | Status: DC | PRN

## 2015-02-12 MED ORDER — LIDOCAINE HCL (CARDIAC) 20 MG/ML IV SOLN
INTRAVENOUS | Status: DC | PRN
  Administered 2015-02-12: 60 mg via INTRAVENOUS

## 2015-02-12 MED ORDER — PHENAZOPYRIDINE HCL 200 MG PO TABS: 200.0000 mg | ORAL_TABLET | Freq: Three times a day (TID) | ORAL | Status: DC | PRN

## 2015-02-12 MED ORDER — BELLADONNA ALKALOIDS-OPIUM 16.2-60 MG RE SUPP
RECTAL | Status: AC
  Filled 2015-02-12: qty 1

## 2015-02-12 MED ORDER — HYOSCYAMINE SULFATE 0.125 MG SL SUBL
0.1250 mg | SUBLINGUAL_TABLET | SUBLINGUAL | Status: DC | PRN
Start: 2015-02-12 — End: 2017-08-16

## 2015-02-12 MED ORDER — PHENAZOPYRIDINE HCL 200 MG PO TABS
200.0000 mg | ORAL_TABLET | Freq: Once | ORAL | Status: AC
  Administered 2015-02-12: 200 mg via ORAL
  Filled 2015-02-12: qty 1

## 2015-02-12 MED ORDER — ONDANSETRON HCL 4 MG/2ML IJ SOLN
INTRAMUSCULAR | Status: AC
  Filled 2015-02-12: qty 2

## 2015-02-12 MED ORDER — OXYCODONE HCL 5 MG PO TABS
ORAL_TABLET | ORAL | Status: AC
  Filled 2015-02-12: qty 1

## 2015-02-12 MED FILL — OSCIMIN SL 0.125 MG TABLET: 0.125 | 5 days supply | Qty: 30 | Fill #0

## 2015-02-12 SURGICAL SUPPLY — 39 items
ADAPTER CATH URET PLST 4-6FR (CATHETERS) IMPLANT
ADPR CATH URET STRL DISP 4-6FR (CATHETERS)
BAG DRAIN URO-CYSTO SKYTR STRL (DRAIN) ×2 IMPLANT
BAG DRN UROCATH (DRAIN) ×1
BASKET LASER NITINOL 1.9FR (BASKET) IMPLANT
BASKET STNLS GEMINI 4WIRE 3FR (BASKET) IMPLANT
BASKET ZERO TIP NITINOL 2.4FR (BASKET) ×2 IMPLANT
BOOTIES KNEE HIGH SLOAN (MISCELLANEOUS) ×2 IMPLANT
BSKT STON RTRVL 120 1.9FR (BASKET)
BSKT STON RTRVL GEM 120X11 3FR (BASKET)
BSKT STON RTRVL ZERO TP 2.4FR (BASKET) ×2
CANISTER SUCT LVC 12 LTR MEDI- (MISCELLANEOUS) IMPLANT
CATH CLEAR GEL 3F BACKSTOP (CATHETERS) IMPLANT
CATH INTERMIT  6FR 70CM (CATHETERS) IMPLANT
CATH URET 5FR 28IN CONE TIP (BALLOONS)
CATH URET 5FR 28IN OPEN ENDED (CATHETERS) IMPLANT
CATH URET 5FR 70CM CONE TIP (BALLOONS) IMPLANT
CATH URET DUAL LUMEN 6-10FR 50 (CATHETERS) IMPLANT
CLOTH BEACON ORANGE TIMEOUT ST (SAFETY) ×2 IMPLANT
ELECT REM PT RETURN 9FT ADLT (ELECTROSURGICAL)
ELECTRODE REM PT RTRN 9FT ADLT (ELECTROSURGICAL) IMPLANT
GLOVE BIO SURGEON STRL SZ7.5 (GLOVE) ×2 IMPLANT
GOWN STRL REUS W/ TWL LRG LVL3 (GOWN DISPOSABLE) ×1 IMPLANT
GOWN STRL REUS W/ TWL XL LVL3 (GOWN DISPOSABLE) ×1 IMPLANT
GOWN STRL REUS W/TWL LRG LVL3 (GOWN DISPOSABLE) ×2
GOWN STRL REUS W/TWL XL LVL3 (GOWN DISPOSABLE) ×2
GUIDEWIRE 0.038 PTFE COATED (WIRE) IMPLANT
GUIDEWIRE ANG ZIPWIRE 038X150 (WIRE) IMPLANT
GUIDEWIRE STR DUAL SENSOR (WIRE) IMPLANT
IV NS IRRIG 3000ML ARTHROMATIC (IV SOLUTION) ×4 IMPLANT
KIT BALLIN UROMAX 15FX10 (LABEL) IMPLANT
KIT BALLN UROMAX 15FX4 (MISCELLANEOUS) IMPLANT
KIT BALLN UROMAX 26 75X4 (MISCELLANEOUS)
KIT ROOM TURNOVER WOR (KITS) ×2 IMPLANT
MANIFOLD NEPTUNE II (INSTRUMENTS) IMPLANT
SET HIGH PRES BAL DIL (LABEL)
SHEATH ACCESS URETERAL 38CM (SHEATH) IMPLANT
SYRINGE IRR TOOMEY STRL 70CC (SYRINGE) ×1 IMPLANT
TUBE CONNECTING 12X1/4 (SUCTIONS) IMPLANT

## 2015-02-12 NOTE — Discharge Instructions (Addendum)
Dietary Guidelines to Help Prevent Kidney Stones Your risk of kidney stones can be decreased by adjusting the foods you eat. The most important thing you can do is drink enough fluid. You should drink enough fluid to keep your urine clear or pale yellow. The following guidelines provide specific information for the type of kidney stone you have had. GUIDELINES ACCORDING TO TYPE OF KIDNEY STONE Calcium Oxalate Kidney Stones 1. Reduce the amount of salt you eat. Foods that have a lot of salt cause your body to release excess calcium into your urine. The excess calcium can combine with a substance called oxalate to form kidney stones. 2. Reduce the amount of animal protein you eat if the amount you eat is excessive. Animal protein causes your body to release excess calcium into your urine. Ask your dietitian how much protein from animal sources you should be eating. 3. Avoid foods that are high in oxalates. If you take vitamins, they should have less than 500 mg of vitamin C. Your body turns vitamin C into oxalates. You do not need to avoid fruits and vegetables high in vitamin C. Calcium Phosphate Kidney Stones  Reduce the amount of salt you eat to help prevent the release of excess calcium into your urine.  Reduce the amount of animal protein you eat if the amount you eat is excessive. Animal protein causes your body to release excess calcium into your urine. Ask your dietitian how much protein from animal sources you should be eating.  Get enough calcium from food or take a calcium supplement (ask your dietitian for recommendations). Food sources of calcium that do not increase your risk of kidney stones include:  Broccoli.  Dairy products, such as cheese and yogurt.  Pudding. Uric Acid Kidney Stones  Do not have more than 6 oz of animal protein per day. FOOD SOURCES Animal Protein Sources  Meat (all types).  Poultry.  Eggs.  Fish, seafood. Foods High in [employer]  seasonings.  Soy sauce.  Teriyaki sauce.  Cured and processed meats.  Salted crackers and snack foods.  Fast food.  Canned soups and most canned foods. Foods High in Oxalates  Grains:  Amaranth.  Barley.  Grits.  Wheat germ.  Bran.  Buckwheat flour.  All bran cereals.  Pretzels.  Whole wheat bread.  Vegetables:  Beans (wax).  Beets and beet greens.  Collard greens.  Eggplant.  Escarole.  Leeks.  Okra.  Parsley.  Rutabagas.  Spinach.  Swiss chard.  Tomato paste.  Fried potatoes.  Sweet potatoes.  Fruits:  Red currants.  Figs.  Kiwi.  Rhubarb.  Meat and Other Protein Sources:  Beans (dried).  Soy burgers and other soybean products.  Miso.  Nuts (peanuts, almonds, pecans, cashews, hazelnuts).  Nut butters.  Sesame seeds and tahini (paste made of sesame seeds).  Poppy seeds.  Beverages:  Chocolate drink mixes.  Soy milk.  Instant iced tea.  Juices made from high-oxalate fruits or vegetables.  Other:  Carob.  Chocolate.  Fruitcake.  Marmalades.   This information is not intended to replace advice given to you by your health care provider. Make sure you discuss any questions you have with your health care provider.   Document Released: 04/29/2010 Document Revised: 01/07/2013 Document Reviewed: 11/29/2012 Elsevier Interactive Patient Education 2016 [employer]. [facility] [phone] Post Ureteroscopy With or Without Stent Instructions  Definitions:  Ureter: The duct that transports urine from the kidney to the bladder. Stent:   A plastic hollow  tube that is placed into the ureter, from the kidney to the                 bladder to prevent the ureter from swelling shut.  GENERAL INSTRUCTIONS:  Despite the fact that no skin incisions were used, the area around the ureter and bladder is raw and irritated. The stent is a foreign body which will further irritate the bladder wall.  This irritation is manifested by increased frequency of urination, both day and night, and by an increase in the urge to urinate. In some, the urge to urinate is present almost always. Sometimes the urge is strong enough that you may not be able to stop yourself from urinating. The only real cure is to remove the stent and then give time for the bladder wall to heal which can't be done until the danger of the ureter swelling shut has passed, which varies.  You may see some blood in your urine while the stent is in place and a few days afterwards. Do not be alarmed, even if the urine was clear for a while. Get off your feet and drink lots of fluids until clearing occurs. If you start to pass clots or don't improve, call us.  DIET: You may return to your normal diet immediately. Because of the raw surface of your bladder, alcohol, spicy foods, acid type foods and drinks with caffeine may cause irritation or frequency and should be used in moderation. To keep your urine flowing freely and to avoid constipation, drink plenty of fluids during the day ( 8-10 glasses ). Tip: Avoid cranberry juice because it is very acidic.  ACTIVITY: Your physical activity doesn't need to be restricted. However, if you are very active, you may see some blood in your urine. We suggest that you reduce your activity under these circumstances until the bleeding has stopped.  BOWELS: It is important to keep your bowels regular during the postoperative period. Straining with bowel movements can cause bleeding. A bowel movement every other day is reasonable. Use a mild laxative if needed, such as Milk of Magnesia 2-3 tablespoons, or 2 Dulcolax tablets. Call if you continue to have problems. If you have been taking narcotics for pain, before, during or after your surgery, you may be constipated. Take a laxative if necessary.   MEDICATION: You should resume your pre-surgery medications unless told not to. In addition you will often  be given an antibiotic to prevent infection. These should be taken as prescribed until the bottles are finished unless you are having an unusual reaction to one of the drugs.  PROBLEMS YOU SHOULD REPORT TO [place]:  Fevers over 100.5 Fahrenheit.  Heavy bleeding, or clots ( See above notes about blood in urine ).  Inability to urinate.  Drug reactions ( hives, rash, nausea, vomiting, diarrhea ).  Severe burning or pain with urination that is not improving.  FOLLOW-UP: You will need a follow-up appointment to monitor your progress. Call for this appointment at the number listed above. Usually the first appointment will be about three to fourteen days after your surgery.      Post Anesthesia Home Care Instructions  Activity: Get plenty of rest for the remainder of the day. A responsible adult should stay with you for 24 hours following the procedure.  For the next 24 hours, DO NOT: -Drive a car -Advertising copywriter -Drink alcoholic beverages -Take any medication unless instructed by your physician -Make any legal decisions or sign important papers.  Meals: Start with liquid foods such as gelatin or soup. Progress to regular foods as tolerated. Avoid greasy, spicy, heavy foods. If nausea and/or vomiting occur, drink only clear liquids until the nausea and/or vomiting subsides. Call your physician if vomiting continues.  Special Instructions/Symptoms: Your throat may feel dry or sore from the anesthesia or the breathing tube placed in your throat during surgery. If this causes discomfort, gargle with warm salt water. The discomfort should disappear within 24 hours.  If you had a scopolamine patch placed behind your ear for the management of post- operative nausea and/or vomiting:  1. The medication in the patch is effective for 72 hours, after which it should be removed.  Wrap patch in a tissue and discard in the trash. Wash hands thoroughly with soap and water. 2. You may remove the  patch earlier than 72 hours if you experience unpleasant side effects which may include dry mouth, dizziness or visual disturbances. 3. Avoid touching the patch. Wash your hands with soap and water after contact with the patch.

## 2015-02-12 NOTE — Interval H&P Note (Signed)
History and Physical Interval Note:  02/12/2015 10:57 AM  Suzanne Jackson  has presented today for surgery, with the diagnosis of RIGHT DISTAL URETERAL STONE  The various methods of treatment have been discussed with the patient and family. After consideration of risks, benefits and other options for treatment, the patient has consented to  Procedure(s): CYSTOSCOPY WITH RETROGRADE PYELOGRAM, URETEROSCOPY AND POSSIBLE JJ STENT PLACEMENT (Right) STONE EXTRACTION WITH BASKET (Right) as a surgical intervention .  The patient's history has been reviewed, patient examined, no change in status, stable for surgery.  I have reviewed the patient's chart and labs.  Questions were answered to the patient's satisfaction.     Suzanne Jackson

## 2015-02-12 NOTE — Op Note (Signed)
Pre-operative diagnosis : Impacted 6 mm distal right ureteral calculus     Postoperative diagnosis:  Same  Operation:  Cystourethroscopy, right retrograde pyelogram with interpretation, right ureteroscopy, laser fractionation of distal right ureteral calculus, impacted. Best extraction of stone fragments.  Surgeon:  Kathie Rhodes. Patsi Sears, MD  First assistant: None    Anesthesia:  Gen. LMA   Preparation: After appropriate preanesthesia, the patient was brought the operating, placed on the operating table in a dorsal supine position where general LMA anesthesia was introduced. She was replaced in the dorsal lithotomy position with the pubis was prepped with Betadine solution and draped in usual fashion. The patient had been given Tylenol 1 g by mouth preoperatively. Note that she is allergic to NSAIDs. The armband was cultured. The history was double checked. The right arm was previously marked.   Review history:  Problems  1. Calculus of kidney (N20.0) 2. Microscopic hematuria (R31.29) 3. Right ureteral stone (N20.1)  History of Present Illness    2nd stone event for this 45 yo female, referred by Dr. Clovis Riley for further evaluation of kidney stones.   She noted 1 month intermittant having Rt flank pain, with intense pain yesterday, urgency, frequency ( 1 week) & N/V with a hx of kidney stones. She had a CT 02/04/15 that showed: 6 mm Rt distal ureteral stone with moderate hydronephrosis, 2 mm Rt renal stone and a 2 mm Lt renal stone.   Unknown what type of kidney stones she had in the past. Sodas: Dt. Pepsi: 5/day, 16 oz ea. No tea.   Fast foods: 3-4x/week.   Family hx. Mother ++ stones.  Tobacco: 27pk/ yr tobacco.    Statement of  Likelihood of Success: Excellent. TIME-OUT observed.:  Procedure:  Cystourethroscopy showed a normal appearing female urethra. The bladder appeared within normal limits. There was no evidence of bladder stone, tumor, or diverticular formation.  Right  retropyelogram was performed, which  showed complete obstruction of the right distal ureter, with no evidence of contrast above the level of the intramural ureter. The stone was poorly visualized, however, on which grade pyelogram. Magnification was used 3, but still the stone was poorly visualized. A 0.038 guidewire was passed through the cystoscope, and through the 6 open-ended catheter, around the obstructing stone, up the ureter, and coiled in the renal pelvis. The cystoscope was removed, and the dual channel ureteroscope was passed through the ureteral orifice, and into the intramural ureter. At the level of the ureteroscope vesicle junction, a 6 mm multifaceted stone was identified, impacted. The 200  laser fiber was then used, with settings of 5/10, in order to fragment the stone. The stone fragments were removed. Photodocumentation was accomplished. Following removal of stone, repeat ureteroscopy was accomplished, which showed some dilation of the ureter resulting in mild hydronephrosis. Once the stone was removed, felt that the intramural ureter was patent, and that the patient would not need a double-J stent. Therefore, the ureteroscope was removed. The bladder was irrigated to be sure there were no fragments within the bladder. The stone was removed from the operating room to be taken for analysis. Photodocumentation was developed, and with one picture for the patient, and one for the office chart. The patient was awakened and taken to recovery room in good condition.

## 2015-02-12 NOTE — Anesthesia Procedure Notes (Signed)
Procedure Name: LMA Insertion Date/Time: 02/12/2015 11:18 AM Performed by: Renella Cunas D Pre-anesthesia Checklist: Patient identified, Emergency Drugs available, Suction available and Patient being monitored Patient Re-evaluated:Patient Re-evaluated prior to inductionOxygen Delivery Method: Circle System Utilized Preoxygenation: Pre-oxygenation with 100% oxygen Intubation Type: IV induction Ventilation: Mask ventilation without difficulty LMA: LMA inserted LMA Size: 4.0 Number of attempts: 1 Airway Equipment and Method: Bite block Placement Confirmation: positive ETCO2 Tube secured with: Tape Dental Injury: Teeth and Oropharynx as per pre-operative assessment

## 2015-02-12 NOTE — Anesthesia Preprocedure Evaluation (Signed)
Anesthesia Evaluation  Patient identified by MRN, date of birth, ID band Patient awake    Reviewed: Allergy & Precautions, NPO status   History of Anesthesia Complications Negative for: history of anesthetic complications  Airway Mallampati: II  TM Distance: >3 FB Neck ROM: Full    Dental   Pulmonary asthma , Current Smoker,    breath sounds clear to auscultation       Cardiovascular negative cardio ROS   Rhythm:Regular Rate:Normal     Neuro/Psych negative neurological ROS     GI/Hepatic negative GI ROS, Neg liver ROS,   Endo/Other    Renal/GU negative Renal ROS     Musculoskeletal  (+) Arthritis ,   Abdominal   Peds  Hematology   Anesthesia Other Findings   Reproductive/Obstetrics                             Anesthesia Physical Anesthesia Plan  ASA: II  Anesthesia Plan: General   Post-op Pain Management:    Induction: Intravenous  Airway Management Planned: LMA  Additional Equipment:   Intra-op Plan:   Post-operative Plan: Extubation in OR  Informed Consent: I have reviewed the patients History and Physical, chart, labs and discussed the procedure including the risks, benefits and alternatives for the proposed anesthesia with the patient or authorized representative who has indicated his/her understanding and acceptance.   Dental advisory given  Plan Discussed with: CRNA and Surgeon  Anesthesia Plan Comments:         Anesthesia Quick Evaluation

## 2015-02-12 NOTE — Anesthesia Postprocedure Evaluation (Signed)
Anesthesia Post Note  Patient: Suzanne Jackson  Procedure(s) Performed: Procedure(s) (LRB): CYSTOSCOPY WITH RETROGRADE PYELOGRAM, URETEROSCOPY (Right) STONE EXTRACTION WITH BASKET AND LASER LITHOTRIPSY (Right)  Patient location during evaluation: PACU Anesthesia Type: General Level of consciousness: awake and alert Pain management: pain level controlled Vital Signs Assessment: post-procedure vital signs reviewed and stable Respiratory status: spontaneous breathing, nonlabored ventilation, respiratory function stable and patient connected to nasal cannula oxygen Cardiovascular status: blood pressure returned to baseline and stable Postop Assessment: no signs of nausea or vomiting Anesthetic complications: no    Last Vitals:  Filed Vitals:   02/12/15 1315 02/12/15 1330  BP: 171/80 166/81  Pulse: 78 76  Temp:    Resp: 13 92    Last Pain:  Filed Vitals:   02/12/15 1331  PainSc: 7                  Suzanne Jackson

## 2015-02-12 NOTE — Transfer of Care (Signed)
Immediate Anesthesia Transfer of Care Note  Patient: Suzanne Jackson  Procedure(s) Performed: Procedure(s) (LRB): CYSTOSCOPY WITH RETROGRADE PYELOGRAM, URETEROSCOPY (Right) STONE EXTRACTION WITH BASKET AND LASER LITHOTRIPSY (Right)  Patient Location: PACU  Anesthesia Type: General  Level of Consciousness: awake, oriented, sedated and patient cooperative  Airway & Oxygen Therapy: Patient Spontanous Breathing and Patient connected to face mask oxygen  Post-op Assessment: Report given to PACU RN and Post -op Vital signs reviewed and stable  Post vital signs: Reviewed and stable  Complications: No apparent anesthesia complications

## 2015-02-15 ENCOUNTER — Encounter (HOSPITAL_BASED_OUTPATIENT_CLINIC_OR_DEPARTMENT_OTHER): Payer: Self-pay | Admitting: Urology

## 2015-05-05 DIAGNOSIS — H1032 Unspecified acute conjunctivitis, left eye: Secondary | ICD-10-CM | POA: Diagnosis not present

## 2015-07-15 DIAGNOSIS — H52223 Regular astigmatism, bilateral: Secondary | ICD-10-CM | POA: Diagnosis not present

## 2015-07-15 DIAGNOSIS — H524 Presbyopia: Secondary | ICD-10-CM | POA: Diagnosis not present

## 2015-07-15 DIAGNOSIS — H5213 Myopia, bilateral: Secondary | ICD-10-CM | POA: Diagnosis not present

## 2015-08-12 ENCOUNTER — Other Ambulatory Visit: Payer: Self-pay | Admitting: Family Medicine

## 2015-08-12 DIAGNOSIS — N6320 Unspecified lump in the left breast, unspecified quadrant: Secondary | ICD-10-CM

## 2015-08-19 ENCOUNTER — Other Ambulatory Visit: Payer: BLUE CROSS/BLUE SHIELD

## 2015-10-19 ENCOUNTER — Other Ambulatory Visit: Payer: Self-pay | Admitting: Family Medicine

## 2015-10-19 ENCOUNTER — Ambulatory Visit
Admission: RE | Admit: 2015-10-19 | Discharge: 2015-10-19 | Disposition: A | Discharge status: Home / Self Care | Payer: BLUE CROSS/BLUE SHIELD | Source: Ambulatory Visit | Attending: Family Medicine | Admitting: Family Medicine

## 2015-10-19 DIAGNOSIS — R319 Hematuria, unspecified: Secondary | ICD-10-CM | POA: Diagnosis not present

## 2015-10-19 DIAGNOSIS — R079 Chest pain, unspecified: Secondary | ICD-10-CM | POA: Diagnosis not present

## 2015-10-19 DIAGNOSIS — R0789 Other chest pain: Secondary | ICD-10-CM

## 2015-10-19 DIAGNOSIS — R634 Abnormal weight loss: Secondary | ICD-10-CM | POA: Diagnosis not present

## 2015-10-19 DIAGNOSIS — R0602 Shortness of breath: Secondary | ICD-10-CM | POA: Diagnosis not present

## 2015-11-23 DIAGNOSIS — N201 Calculus of ureter: Secondary | ICD-10-CM | POA: Diagnosis not present

## 2015-11-23 DIAGNOSIS — R3121 Asymptomatic microscopic hematuria: Secondary | ICD-10-CM | POA: Diagnosis not present

## 2015-12-15 DIAGNOSIS — N201 Calculus of ureter: Secondary | ICD-10-CM | POA: Diagnosis not present

## 2015-12-16 DIAGNOSIS — J45909 Unspecified asthma, uncomplicated: Secondary | ICD-10-CM | POA: Diagnosis not present

## 2015-12-16 DIAGNOSIS — R079 Chest pain, unspecified: Secondary | ICD-10-CM | POA: Diagnosis not present

## 2015-12-16 DIAGNOSIS — I10 Essential (primary) hypertension: Secondary | ICD-10-CM | POA: Diagnosis not present

## 2016-02-21 DIAGNOSIS — N201 Calculus of ureter: Secondary | ICD-10-CM | POA: Diagnosis not present

## 2016-04-03 ENCOUNTER — Other Ambulatory Visit: Payer: Self-pay | Admitting: Family Medicine

## 2016-04-03 ENCOUNTER — Other Ambulatory Visit (HOSPITAL_COMMUNITY)
Admission: RE | Admit: 2016-04-03 | Discharge: 2016-04-03 | Disposition: A | Discharge status: Home / Self Care | Payer: BLUE CROSS/BLUE SHIELD | Source: Ambulatory Visit | Attending: Family Medicine | Admitting: Family Medicine

## 2016-04-03 DIAGNOSIS — Z1151 Encounter for screening for human papillomavirus (HPV): Secondary | ICD-10-CM | POA: Diagnosis not present

## 2016-04-03 DIAGNOSIS — Z124 Encounter for screening for malignant neoplasm of cervix: Secondary | ICD-10-CM | POA: Insufficient documentation

## 2016-04-03 DIAGNOSIS — I10 Essential (primary) hypertension: Secondary | ICD-10-CM | POA: Diagnosis not present

## 2016-04-03 DIAGNOSIS — R591 Generalized enlarged lymph nodes: Secondary | ICD-10-CM | POA: Diagnosis not present

## 2016-04-03 DIAGNOSIS — N63 Unspecified lump in unspecified breast: Secondary | ICD-10-CM | POA: Diagnosis not present

## 2016-04-03 DIAGNOSIS — Z0000 Encounter for general adult medical examination without abnormal findings: Secondary | ICD-10-CM | POA: Diagnosis not present

## 2016-04-03 DIAGNOSIS — N6320 Unspecified lump in the left breast, unspecified quadrant: Secondary | ICD-10-CM

## 2016-04-03 DIAGNOSIS — M545 Low back pain: Secondary | ICD-10-CM | POA: Diagnosis not present

## 2016-04-07 LAB — CYTOLOGY - PAP
Diagnosis: UNDETERMINED — AB
HPV: NOT DETECTED

## 2016-04-10 ENCOUNTER — Ambulatory Visit
Admission: RE | Admit: 2016-04-10 | Discharge: 2016-04-10 | Disposition: A | Discharge status: Home / Self Care | Payer: BLUE CROSS/BLUE SHIELD | Source: Ambulatory Visit | Attending: Family Medicine | Admitting: Family Medicine

## 2016-04-10 DIAGNOSIS — R928 Other abnormal and inconclusive findings on diagnostic imaging of breast: Secondary | ICD-10-CM | POA: Diagnosis not present

## 2016-04-10 DIAGNOSIS — N6320 Unspecified lump in the left breast, unspecified quadrant: Secondary | ICD-10-CM

## 2016-04-10 DIAGNOSIS — N6489 Other specified disorders of breast: Secondary | ICD-10-CM | POA: Diagnosis not present

## 2016-05-24 DIAGNOSIS — Z720 Tobacco use: Secondary | ICD-10-CM | POA: Diagnosis not present

## 2016-05-24 DIAGNOSIS — R87610 Atypical squamous cells of undetermined significance on cytologic smear of cervix (ASC-US): Secondary | ICD-10-CM | POA: Diagnosis not present

## 2016-05-24 DIAGNOSIS — N3281 Overactive bladder: Secondary | ICD-10-CM | POA: Diagnosis not present

## 2016-08-29 DIAGNOSIS — M5416 Radiculopathy, lumbar region: Secondary | ICD-10-CM | POA: Diagnosis not present

## 2016-09-07 DIAGNOSIS — M545 Low back pain: Secondary | ICD-10-CM | POA: Diagnosis not present

## 2016-09-11 DIAGNOSIS — M5416 Radiculopathy, lumbar region: Secondary | ICD-10-CM | POA: Diagnosis not present

## 2016-09-15 DIAGNOSIS — F17210 Nicotine dependence, cigarettes, uncomplicated: Secondary | ICD-10-CM | POA: Diagnosis not present

## 2016-09-15 DIAGNOSIS — Z716 Tobacco abuse counseling: Secondary | ICD-10-CM | POA: Diagnosis not present

## 2016-09-15 DIAGNOSIS — M48061 Spinal stenosis, lumbar region without neurogenic claudication: Secondary | ICD-10-CM | POA: Diagnosis not present

## 2016-10-04 DIAGNOSIS — M48061 Spinal stenosis, lumbar region without neurogenic claudication: Secondary | ICD-10-CM | POA: Diagnosis not present

## 2016-10-16 ENCOUNTER — Emergency Department (HOSPITAL_COMMUNITY)
Admission: EM | Admit: 2016-10-16 | Discharge: 2016-10-17 | Disposition: A | Discharge status: Home / Self Care | Payer: BLUE CROSS/BLUE SHIELD | Attending: Emergency Medicine | Admitting: Emergency Medicine

## 2016-10-16 ENCOUNTER — Emergency Department (HOSPITAL_COMMUNITY): Payer: BLUE CROSS/BLUE SHIELD

## 2016-10-16 ENCOUNTER — Encounter (HOSPITAL_COMMUNITY): Payer: Self-pay

## 2016-10-16 DIAGNOSIS — W25XXXA Contact with sharp glass, initial encounter: Secondary | ICD-10-CM | POA: Insufficient documentation

## 2016-10-16 DIAGNOSIS — Z23 Encounter for immunization: Secondary | ICD-10-CM | POA: Diagnosis not present

## 2016-10-16 DIAGNOSIS — F17210 Nicotine dependence, cigarettes, uncomplicated: Secondary | ICD-10-CM | POA: Insufficient documentation

## 2016-10-16 DIAGNOSIS — S51811A Laceration without foreign body of right forearm, initial encounter: Secondary | ICD-10-CM | POA: Diagnosis not present

## 2016-10-16 DIAGNOSIS — Y999 Unspecified external cause status: Secondary | ICD-10-CM | POA: Diagnosis not present

## 2016-10-16 DIAGNOSIS — Y929 Unspecified place or not applicable: Secondary | ICD-10-CM | POA: Insufficient documentation

## 2016-10-16 DIAGNOSIS — J45909 Unspecified asthma, uncomplicated: Secondary | ICD-10-CM | POA: Insufficient documentation

## 2016-10-16 DIAGNOSIS — Y939 Activity, unspecified: Secondary | ICD-10-CM | POA: Insufficient documentation

## 2016-10-16 LAB — I-STAT BETA HCG BLOOD, ED (MC, WL, AP ONLY)

## 2016-10-16 MED ORDER — TETANUS-DIPHTH-ACELL PERTUSSIS 5-2.5-18.5 LF-MCG/0.5 IM SUSP
0.5000 mL | Freq: Once | INTRAMUSCULAR | Status: AC
  Administered 2016-10-16: 0.5 mL via INTRAMUSCULAR
  Filled 2016-10-16: qty 0.5

## 2016-10-16 MED ORDER — HYDROCODONE-ACETAMINOPHEN 5-325 MG PO TABS
1.0000 | ORAL_TABLET | Freq: Once | ORAL | Status: AC
  Administered 2016-10-16: 1 via ORAL
  Filled 2016-10-16: qty 1

## 2016-10-16 MED ORDER — OXYCODONE-ACETAMINOPHEN 5-325 MG PO TABS
1.0000 | ORAL_TABLET | Freq: Once | ORAL | Status: AC
  Administered 2016-10-16: 1 via ORAL
  Filled 2016-10-16: qty 1

## 2016-10-16 MED ORDER — LIDOCAINE-EPINEPHRINE (PF) 2 %-1:200000 IJ SOLN
10.0000 mL | Freq: Once | INTRAMUSCULAR | Status: AC
  Administered 2016-10-16: 10 mL
  Filled 2016-10-16: qty 20

## 2016-10-16 NOTE — ED Triage Notes (Signed)
Pt arrives to ed with lac to right forearm; pt states she stuck arm through a clock; wet to dry dressing applied at triage; pt has large deep laceration to right forarm; bleeding controlled at traige;

## 2016-10-16 NOTE — ED Notes (Signed)
Patient transported to X-ray 

## 2016-10-16 NOTE — Discharge Instructions (Signed)
Follow up with your doctor in 7-10 days for suture removal, sooner with any sign of infection - fever, redness, increasing pain or drainage from the wound. Continue your hydrocodone for pain as needed.

## 2016-10-16 NOTE — ED Provider Notes (Signed)
[facility]-EMERGENCY DEPT Provider Note   CSN: [id number] Arrival date & time: 10/16/16  2053     History   Chief Complaint Chief Complaint  Patient presents with  . Laceration    HPI Suzanne Jackson is a 46 y.o. female.  Patient to ED with laceration to right proximal forearm. She reports hitting the face of a grandfather clock, breaking the glass and causing laceration. No other injury. She reports alcohol use tonight. No numbness or tingling of the right UE.    The history is provided by the patient. No language interpreter was used.    Past Medical History:  Diagnosis Date  . Arthritis    BACK  . Asthma   . Depression   . Frequency of urination   . History of kidney stones   . Microhematuria   . Nephrolithiasis    bilateral non-obstructive per ct 02-04-2015  . Right ureteral stone   . Wears contact lenses     There are no active problems to display for this patient.   Past Surgical History:  Procedure Laterality Date  . CYSTO/  URETEROSCOPIC STONE EXTRACTION  1998  . CYSTOSCOPY WITH RETROGRADE PYELOGRAM, URETEROSCOPY AND STENT PLACEMENT Right 02/12/2015   Procedure: CYSTOSCOPY WITH RETROGRADE PYELOGRAM, URETEROSCOPY;  Surgeon: Jethro Bolus, MD;  Location: [facility];  Service: Urology;  Laterality: Right;  . LAPAROSCOPIC CHOLECYSTECTOMY  1994  . LAPAROSCOPIC TUBAL LIGATION Bilateral 05-06-2009   fulgeration  . POSTERIOR FUSION LUMBAR SPINE  10-28-2008   L5 - S1  . STONE EXTRACTION WITH BASKET Right 02/12/2015   Procedure: STONE EXTRACTION WITH BASKET AND LASER LITHOTRIPSY;  Surgeon: Jethro Bolus, MD;  Location: [facility];  Service: Urology;  Laterality: Right;    OB History    No data available       Home Medications    Prior to Admission medications   Medication Sig Start Date End Date Taking? Authorizing Provider  budesonide-formoterol (SYMBICORT) 160-4.5 MCG/ACT inhaler Inhale 1 puff into the lungs 2 (two)  times daily.    [provider]  cetirizine (ZYRTEC) 10 MG tablet Take 10 mg by mouth every morning.    [provider]  FLUoxetine (PROZAC) 40 MG capsule Take 40 mg by mouth every morning.  11/05/12   [provider]  HYDROcodone-acetaminophen (NORCO) 7.5-325 MG tablet Take 1 tablet by mouth every 6 (six) hours as needed for moderate pain.    [provider]  hyoscyamine (LEVSIN/SL) 0.125 MG SL tablet Place 1 tablet (0.125 mg total) under the tongue every 4 (four) hours as needed for cramping. 02/12/15   Jethro Bolus, MD  montelukast (SINGULAIR) 10 MG tablet Take 10 mg by mouth every morning.  11/21/12   [provider]  phenazopyridine (PYRIDIUM) 200 MG tablet Take 1 tablet (200 mg total) by mouth 3 (three) times daily as needed for pain. 02/12/15   Jethro Bolus, MD  PROAIR HFA 108 (90 BASE) MCG/ACT inhaler Inhale 1-2 puffs into the lungs every 6 (six) hours as needed for wheezing or shortness of breath.  11/21/12   [provider]  traMADol-acetaminophen (ULTRACET) 37.5-325 MG tablet Take 1 tablet by mouth every 6 (six) hours as needed. 02/12/15   Jethro Bolus, MD    Family History History reviewed. No pertinent family history.  Social History Social History  Substance Use Topics  . Smoking status: Current Every Day Smoker    Packs/day: 1.00    Years: 25.00    Types: Cigarettes  .  Smokeless tobacco: Never Used  . Alcohol use Yes     Comment: occasional     Allergies   Aspirin and Ibuprofen   Review of Systems Review of Systems  Gastrointestinal: Negative for nausea.  Skin: Positive for wound.  Neurological: Negative for weakness and numbness.  Hematological: Does not bruise/bleed easily.     Physical Exam Updated Vital Signs BP 133/90 (BP Location: Left Arm)   Pulse (!) 103   Temp 97.7 F (36.5 C) (Oral)   Resp 18   Ht 5' 1.5" (1.562 m)   Wt 53.1 kg (117 lb)   LMP 09/15/2016 (Approximate)  Comment: tubal ligation 2011  SpO2 100%   BMI 21.75 kg/m   Physical Exam  Constitutional: She is oriented to person, place, and time. She appears well-developed and well-nourished.  Neck: Normal range of motion.  Pulmonary/Chest: Effort normal.  Musculoskeletal:  FROM right UE. No bony deformity.   Neurological: She is alert and oriented to person, place, and time.  Skin: Skin is warm and dry.  5 cm crescent shaped, full thickness laceration to posterior proximal right forearm. No tendon exposure. FROM of distal right UE with firm grip.       ED Treatments / Results  Labs (all labs ordered are listed, but only abnormal results are displayed) Labs Reviewed  I-STAT BETA HCG BLOOD, ED ([facility], [facility], [facility] ONLY)    EKG  EKG Interpretation None       Radiology Dg Forearm Right  Result Date: 10/16/2016 CLINICAL DATA:  Laceration to the right forearm after injury from a glass case. Pain to the proximal forearm medially. EXAM: RIGHT FOREARM - 2 VIEW COMPARISON:  None. FINDINGS: Soft tissue defect over the proximal medial aspect of the right forearm with overlying gauze material. No radiopaque soft tissue foreign bodies are identified. Underlying bones appear intact. No evidence of acute fracture or dislocation of the radius or ulna. No focal bone lesion or bone destruction. IMPRESSION: No acute bony abnormalities. No radiopaque soft tissue foreign bodies identified. Electronically Signed   By: Burman Nieves M.D.   On: 10/16/2016 21:50    Procedures .Marland KitchenLaceration Repair Date/Time: 10/17/2016 11:45 PM Performed by: Elpidio Anis Authorized by: Elpidio Anis   Consent:    Consent obtained:  Verbal   Consent given by:  Patient Anesthesia (see MAR for exact dosages):    Anesthesia method:  Local infiltration   Local anesthetic:  Lidocaine 2% WITH epi Laceration details:    Location:  Shoulder/arm   Shoulder/arm location:  R lower arm   Length (cm):  5 Repair type:    Repair type:   Intermediate Pre-procedure details:    Preparation:  Patient was prepped and draped in usual sterile fashion and imaging obtained to evaluate for foreign bodies Exploration:    Hemostasis achieved with:  Direct pressure   Wound exploration: wound explored through full range of motion and entire depth of wound probed and visualized     Wound extent: fascia violated     Wound extent: no foreign bodies/material noted and no muscle damage noted     Contaminated: no   Treatment:    Area cleansed with:  Betadine and saline   Amount of cleaning:  Extensive   Irrigation solution:  Sterile saline   Irrigation method:  Syringe Skin repair:    Repair method:  Sutures   Suture size:  4-0   Suture material:  Prolene   Suture technique:  Simple interrupted   Number of sutures:  13 Approximation:    Approximation:  Close   Vermilion border: well-aligned   Post-procedure details:    Dressing:  Antibiotic ointment and adhesive bandage   Patient tolerance of procedure:  Tolerated well, no immediate complications   (including critical care time)  Medications Ordered in ED Medications  HYDROcodone-acetaminophen (NORCO/VICODIN) 5-325 MG per tablet 1 tablet (not administered)  Tdap (BOOSTRIX) injection 0.5 mL (not administered)  lidocaine-EPINEPHrine (XYLOCAINE W/EPI) 2 %-1:200000 (PF) injection 10 mL (not administered)     Initial Impression / Assessment and Plan / ED Course  I have reviewed the triage vital signs and the nursing notes.  Pertinent labs & imaging results that were available during my care of the patient were reviewed by me and considered in my medical decision making (see chart for details).     Patient presents with laceration to right arm after contact with glass. No FBs, no tendon deficits. No muscular injury.  Wound repaired as per above note. Tetanus updated.     Final Clinical Impressions(s) / ED Diagnoses   Final diagnoses:  None   1. Laceration right  arm  New Prescriptions New Prescriptions   No medications on file     Elpidio Anis, Cordelia Poche 10/17/16 2349    Mancel Bale, MD 10/18/16 1332

## 2016-10-16 NOTE — ED Notes (Signed)
Pt in XR. 

## 2016-10-17 ENCOUNTER — Emergency Department (HOSPITAL_COMMUNITY)
Admission: EM | Admit: 2016-10-17 | Discharge: 2016-10-17 | Disposition: A | Discharge status: Home / Self Care | Payer: BLUE CROSS/BLUE SHIELD | Attending: Emergency Medicine | Admitting: Emergency Medicine

## 2016-10-17 ENCOUNTER — Emergency Department (HOSPITAL_COMMUNITY): Payer: BLUE CROSS/BLUE SHIELD

## 2016-10-17 ENCOUNTER — Encounter (HOSPITAL_COMMUNITY): Payer: Self-pay

## 2016-10-17 DIAGNOSIS — I517 Cardiomegaly: Secondary | ICD-10-CM | POA: Diagnosis not present

## 2016-10-17 DIAGNOSIS — F1010 Alcohol abuse, uncomplicated: Secondary | ICD-10-CM | POA: Diagnosis not present

## 2016-10-17 DIAGNOSIS — R519 Headache, unspecified: Secondary | ICD-10-CM

## 2016-10-17 DIAGNOSIS — J45909 Unspecified asthma, uncomplicated: Secondary | ICD-10-CM | POA: Diagnosis not present

## 2016-10-17 DIAGNOSIS — Y900 Blood alcohol level of less than 20 mg/100 ml: Secondary | ICD-10-CM | POA: Diagnosis not present

## 2016-10-17 DIAGNOSIS — Z79899 Other long term (current) drug therapy: Secondary | ICD-10-CM | POA: Insufficient documentation

## 2016-10-17 DIAGNOSIS — F17210 Nicotine dependence, cigarettes, uncomplicated: Secondary | ICD-10-CM | POA: Insufficient documentation

## 2016-10-17 DIAGNOSIS — R51 Headache: Secondary | ICD-10-CM | POA: Diagnosis not present

## 2016-10-17 DIAGNOSIS — R112 Nausea with vomiting, unspecified: Secondary | ICD-10-CM | POA: Diagnosis not present

## 2016-10-17 LAB — CBC
HEMATOCRIT: 41.5 % (ref 36.0–46.0)
HEMOGLOBIN: 13.8 g/dL (ref 12.0–15.0)
MCH: 33.2 pg (ref 26.0–34.0)
MCHC: 33.3 g/dL (ref 30.0–36.0)
MCV: 99.8 fL (ref 78.0–100.0)
Platelets: 227 10*3/uL (ref 150–400)
RBC: 4.16 MIL/uL (ref 3.87–5.11)
RDW: 14.7 % (ref 11.5–15.5)
WBC: 9.2 10*3/uL (ref 4.0–10.5)

## 2016-10-17 LAB — I-STAT TROPONIN, ED: TROPONIN I, POC: 0 ng/mL (ref 0.00–0.08)

## 2016-10-17 LAB — URINALYSIS, ROUTINE W REFLEX MICROSCOPIC
BILIRUBIN URINE: NEGATIVE
GLUCOSE, UA: NEGATIVE mg/dL
KETONES UR: 5 mg/dL — AB
Leukocytes, UA: NEGATIVE
NITRITE: NEGATIVE
PH: 6 (ref 5.0–8.0)
PROTEIN: 30 mg/dL — AB
Specific Gravity, Urine: 1.018 (ref 1.005–1.030)

## 2016-10-17 LAB — ETHANOL: Alcohol, Ethyl (B): <10 mg/dL (ref <10)

## 2016-10-17 LAB — COMPREHENSIVE METABOLIC PANEL
ALBUMIN: 3.8 g/dL (ref 3.5–5.0)
ALT: 11 U/L — AB (ref 14–54)
ANION GAP: 14 (ref 5–15)
AST: 25 U/L (ref 15–41)
Alkaline Phosphatase: 47 U/L (ref 38–126)
BUN: 10 mg/dL (ref 6–20)
CHLORIDE: 103 mmol/L (ref 101–111)
CO2: 20 mmol/L — AB (ref 22–32)
Calcium: 8.9 mg/dL (ref 8.9–10.3)
Creatinine, Ser: 0.58 mg/dL (ref 0.44–1.00)
GFR calc non Af Amer: >60 mL/min (ref >60)
Glucose, Bld: 87 mg/dL (ref 65–99)
Potassium: 3.7 mmol/L (ref 3.5–5.1)
SODIUM: 137 mmol/L (ref 135–145)
Total Bilirubin: 0.6 mg/dL (ref 0.3–1.2)
Total Protein: 6.7 g/dL (ref 6.5–8.1)

## 2016-10-17 LAB — RAPID URINE DRUG SCREEN, HOSP PERFORMED
Amphetamines: NOT DETECTED
BARBITURATES: NOT DETECTED
Benzodiazepines: NOT DETECTED
Cocaine: NOT DETECTED
OPIATES: POSITIVE — AB
TETRAHYDROCANNABINOL: POSITIVE — AB

## 2016-10-17 LAB — I-STAT BETA HCG BLOOD, ED (MC, WL, AP ONLY): HCG, QUANTITATIVE: 14.2 m[IU]/mL — AB (ref <5)

## 2016-10-17 LAB — HCG, QUANTITATIVE, PREGNANCY: hCG, Beta Chain, Quant, S: <1 m[IU]/mL (ref <5)

## 2016-10-17 LAB — LIPASE, BLOOD: LIPASE: 53 U/L — AB (ref 11–51)

## 2016-10-17 LAB — I-STAT CG4 LACTIC ACID, ED
LACTIC ACID, VENOUS: 3.38 mmol/L — AB (ref 0.5–1.9)
Lactic Acid, Venous: 1.48 mmol/L (ref 0.5–1.9)

## 2016-10-17 MED ORDER — THIAMINE HCL 100 MG/ML IJ SOLN
100.0000 mg | Freq: Once | INTRAMUSCULAR | Status: AC
  Administered 2016-10-17: 100 mg via INTRAVENOUS
  Filled 2016-10-17: qty 2

## 2016-10-17 MED ORDER — ONDANSETRON 4 MG PO TBDP: 4.0000 mg | ORAL_TABLET | Freq: Three times a day (TID) | ORAL | 0 refills | Status: DC | PRN

## 2016-10-17 MED ORDER — SODIUM CHLORIDE 0.9 % IV BOLUS (SEPSIS)
1000.0000 mL | Freq: Once | INTRAVENOUS | Status: AC
  Administered 2016-10-17: 1000 mL via INTRAVENOUS

## 2016-10-17 MED ORDER — ONDANSETRON 4 MG PO TBDP
4.0000 mg | ORAL_TABLET | Freq: Once | ORAL | Status: AC | PRN
  Administered 2016-10-17: 4 mg via ORAL

## 2016-10-17 MED ORDER — METOCLOPRAMIDE HCL 5 MG/ML IJ SOLN
10.0000 mg | Freq: Once | INTRAMUSCULAR | Status: AC
  Administered 2016-10-17: 10 mg via INTRAVENOUS
  Filled 2016-10-17: qty 2

## 2016-10-17 MED ORDER — ONDANSETRON HCL 4 MG/2ML IJ SOLN
4.0000 mg | Freq: Once | INTRAMUSCULAR | Status: AC
  Administered 2016-10-17: 4 mg via INTRAVENOUS
  Filled 2016-10-17: qty 2

## 2016-10-17 MED ORDER — PROCHLORPERAZINE EDISYLATE 5 MG/ML IJ SOLN
10.0000 mg | Freq: Once | INTRAMUSCULAR | Status: AC
  Administered 2016-10-17: 10 mg via INTRAVENOUS
  Filled 2016-10-17: qty 2

## 2016-10-17 MED ORDER — FOLIC ACID 5 MG/ML IJ SOLN
1.0000 mg | Freq: Every day | INTRAMUSCULAR | Status: DC
  Administered 2016-10-17: 1 mg via INTRAVENOUS
  Filled 2016-10-17: qty 0.2

## 2016-10-17 MED ORDER — LORAZEPAM 2 MG/ML IJ SOLN
1.0000 mg | Freq: Once | INTRAMUSCULAR | Status: AC
  Administered 2016-10-17: 1 mg via INTRAVENOUS
  Filled 2016-10-17: qty 1

## 2016-10-17 MED ORDER — ONDANSETRON 4 MG PO TBDP
ORAL_TABLET | ORAL | Status: AC
  Filled 2016-10-17: qty 1

## 2016-10-17 MED ORDER — DIPHENHYDRAMINE HCL 50 MG/ML IJ SOLN
25.0000 mg | Freq: Once | INTRAMUSCULAR | Status: AC
  Administered 2016-10-17: 25 mg via INTRAVENOUS
  Filled 2016-10-17: qty 1

## 2016-10-17 NOTE — ED Provider Notes (Signed)
[facility]-EMERGENCY DEPT Provider Note   CSN: [id number] Arrival date & time: 10/17/16  4098     History   Chief Complaint Chief Complaint  Patient presents with  . Emesis  . Wound Check    HPI Suzanne Jackson is a 46 y.o. female with h/o migraines presents to ED for evaluation of constant, non radiating, 10/10 gradual in onset headache that started sometime during the night, located around forehead. Associated symptoms include nausea and vomiting x 20 times, generalized shakiness and feeling dehydrated. Has h/o migraines but never this bad and usually resolve on their own w/o intervention. No alleviating or aggravating factors. Has not tried ibu or tylenol for pain. Denies neck pain, fevers, changes in vision, abdominal pain, dysuria, abdnormal vaginal bleeding or discharge, n/w to extremities, slurred speech or head trauma. No anticoagulants.  4-6 ETOH drink daily. 1 ppd cigarettes. Occasional marijuana use. Denies other illicit drug use. Denies previous withdrawals from ETOH.   HPI  Past Medical History:  Diagnosis Date  . Arthritis    BACK  . Asthma   . Depression   . Frequency of urination   . History of kidney stones   . Microhematuria   . Nephrolithiasis    bilateral non-obstructive per ct 02-04-2015  . Right ureteral stone   . Wears contact lenses     There are no active problems to display for this patient.   Past Surgical History:  Procedure Laterality Date  . CYSTO/  URETEROSCOPIC STONE EXTRACTION  1998  . CYSTOSCOPY WITH RETROGRADE PYELOGRAM, URETEROSCOPY AND STENT PLACEMENT Right 02/12/2015   Procedure: CYSTOSCOPY WITH RETROGRADE PYELOGRAM, URETEROSCOPY;  Surgeon: Jethro Bolus, MD;  Location: [facility];  Service: Urology;  Laterality: Right;  . LAPAROSCOPIC CHOLECYSTECTOMY  1994  . LAPAROSCOPIC TUBAL LIGATION Bilateral 05-06-2009   fulgeration  . POSTERIOR FUSION LUMBAR SPINE  10-28-2008   L5 - S1  . STONE EXTRACTION WITH BASKET Right  02/12/2015   Procedure: STONE EXTRACTION WITH BASKET AND LASER LITHOTRIPSY;  Surgeon: Jethro Bolus, MD;  Location: [facility] ;  Service: Urology;  Laterality: Right;    OB History    No data available       Home Medications    Prior to Admission medications   Medication Sig Start Date End Date Taking? Authorizing Provider  amLODipine (NORVASC) 5 MG tablet Take 5 mg by mouth. 10/11/16  Yes [provider]  budesonide-formoterol (SYMBICORT) 160-4.5 MCG/ACT inhaler Inhale 1 puff into the lungs 2 (two) times daily.   Yes [provider]  cetirizine (ZYRTEC) 10 MG tablet Take 10 mg by mouth every morning.   Yes [provider]  FLUoxetine (PROZAC) 40 MG capsule Take 40 mg by mouth every morning.  11/05/12  Yes [provider]  gabapentin (NEURONTIN) 300 MG capsule Take 300 capsules by mouth 3 (three) times daily. 09/11/16  Yes [provider]  hydrochlorothiazide (MICROZIDE) 12.5 MG capsule Take 12.5 mg by mouth daily. 10/11/16  Yes [provider]  HYDROcodone-acetaminophen (NORCO) 7.5-325 MG tablet Take 1 tablet by mouth every 6 (six) hours as needed for moderate pain.   Yes [provider]  hyoscyamine (LEVSIN/SL) 0.125 MG SL tablet Place 1 tablet (0.125 mg total) under the tongue every 4 (four) hours as needed for cramping. 02/12/15  Yes Jethro Bolus, MD  montelukast (SINGULAIR) 10 MG tablet Take 10 mg by mouth every morning.  11/21/12  Yes [provider]  PROAIR HFA 108 (90 BASE) MCG/ACT inhaler Inhale  1-2 puffs into the lungs every 6 (six) hours as needed for wheezing or shortness of breath.  11/21/12  Yes [provider]  ondansetron (ZOFRAN ODT) 4 MG disintegrating tablet Take 1 tablet (4 mg total) by mouth every 8 (eight) hours as needed for nausea or vomiting. 10/17/16   Liberty Handy, PA-C  traMADol-acetaminophen (ULTRACET) 37.5-325 MG tablet Take 1 tablet by mouth every 6 (six)  hours as needed. Patient not taking: Reported on 10/17/2016 02/12/15   Jethro Bolus, MD    Family History No family history on file.  Social History Social History  Substance Use Topics  . Smoking status: Current Every Day Smoker    Packs/day: 1.00    Years: 25.00    Types: Cigarettes  . Smokeless tobacco: Never Used  . Alcohol use Yes     Comment: occasional     Allergies   Aspirin and Ibuprofen   Review of Systems Review of Systems  Constitutional: Positive for diaphoresis. Negative for chills and fever.  Eyes: Negative for visual disturbance.  Respiratory: Negative for cough, chest tightness and shortness of breath.   Cardiovascular: Negative for chest pain.  Gastrointestinal: Positive for nausea and vomiting. Negative for abdominal pain, constipation and diarrhea.  Genitourinary: Negative for difficulty urinating, dysuria and hematuria.  Musculoskeletal: Negative for back pain.  Skin: Positive for wound (s/p laceration repair yesterday in ED).  Neurological: Positive for headaches. Negative for syncope, speech difficulty, weakness, light-headedness and numbness.     Physical Exam Updated Vital Signs BP (!) 144/77   Pulse 84   Temp (!) 97.5 F (36.4 C) (Oral)   Resp (!) 22   Ht  (1.549 m)   Wt 52.6 kg (116 lb)   LMP 09/17/2016 (Approximate)   SpO2 95%   BMI 21.92 kg/m   Physical Exam  Constitutional: She is oriented to person, place, and time. She appears well-developed and well-nourished. She appears distressed.  Generalized shakiness, looks uncomfortable. Actively vomiting during exam.   HENT:  Head: Normocephalic and atraumatic.  Nose: Nose normal.  Mouth/Throat: No oropharyngeal exudate.  Lips dry, moist mucous membranes otherwise Normal oropharynx and tonsils  Eyes: Conjunctivae are normal.  Questionable periorbital edema L>R No nystagmus PERRL and EOMs intact bilaterally  Neck: Normal range of motion. Neck supple.  No midline  c-spine pain Full painless PROM of neck without rigidity  Cardiovascular: Normal rate, regular rhythm, normal heart sounds and intact distal pulses.   No murmur heard. Pulses:      Radial pulses are 2+ on the right side, and 2+ on the left side.       Dorsalis pedis pulses are 2+ on the right side, and 2+ on the left side.  Hypertensive No LE edema or calf tenderness  Pulmonary/Chest: Effort normal and breath sounds normal. No respiratory distress. She has no wheezes. She has no rales. She exhibits no tenderness.  Abdominal: Soft. Bowel sounds are normal. She exhibits no distension and no mass. There is no tenderness. There is no rebound and no guarding.  No suprapubic or CVAT  Musculoskeletal: Normal range of motion. She exhibits no deformity.  Lymphadenopathy:    She has no cervical adenopathy.  Neurological: She is alert and oriented to person, place, and time. No sensory deficit.  A&O to self, place and time. Speech and phonation normal.   Strength 5/5 in upper and lower extremities.   Sensation to light touch intact in upper and lower extremities.  Unable to stand on her  own, shaky and sat back down. Unable to assess Romberg. No leg drift.  Intact finger to nose test. CN I not tested CN II full visual fields  CN III, IV, VI PEERL and EOMs intact bilaterally CN V light touch intact in all 3 divisions of trigeminal nerve CN VII facial nerve movements intact, symmetric, bilaterally CN VIII hearing intact to finger rub, bilaterally CN IX, X no uvula deviation, symmetric soft palate rise CN XI 5/5 SCM and trapezius strength bilaterally  CN XII Tongue midline with symmetric L/R movement  Skin: Skin is warm. Capillary refill takes less than 2 seconds. Laceration noted. She is diaphoretic.  Clammy Large laceration to right medial elbow s/p laceration repair, no erythema, warmth or drainage only mild ecchymosis and edema appropriately tender  Psychiatric: She has a normal mood and  affect. Her behavior is normal. Judgment and thought content normal.  Nursing note and vitals reviewed.    ED Treatments / Results  Labs (all labs ordered are listed, but only abnormal results are displayed) Labs Reviewed  LIPASE, BLOOD - Abnormal; Notable for the following:       Result Value   Lipase 53 (*)    All other components within normal limits  COMPREHENSIVE METABOLIC PANEL - Abnormal; Notable for the following:    CO2 20 (*)    ALT 11 (*)    All other components within normal limits  URINALYSIS, ROUTINE W REFLEX MICROSCOPIC - Abnormal; Notable for the following:    Hgb urine dipstick MODERATE (*)    Ketones, ur 5 (*)    Protein, ur 30 (*)    Bacteria, UA RARE (*)    Squamous Epithelial / LPF 0-5 (*)    All other components within normal limits  RAPID URINE DRUG SCREEN, HOSP PERFORMED - Abnormal; Notable for the following:    Opiates POSITIVE (*)    Tetrahydrocannabinol POSITIVE (*)    All other components within normal limits  I-STAT BETA HCG BLOOD, ED ([facility], [facility], [facility] ONLY) - Abnormal; Notable for the following:    I-stat hCG, quantitative 14.2 (*)    All other components within normal limits  I-STAT CG4 LACTIC ACID, ED - Abnormal; Notable for the following:    Lactic Acid, Venous 3.38 (*)    All other components within normal limits  CBC  ETHANOL  HCG, QUANTITATIVE, PREGNANCY  I-STAT TROPONIN, ED  I-STAT CG4 LACTIC ACID, ED    EKG  EKG Interpretation  Date/Time:  Tuesday October 17 2016 11:21:19 EDT Ventricular Rate:  82 PR Interval:    QRS Duration: 100 QT Interval:  429 QTC Calculation: 502 R Axis:   59 Text Interpretation:  Sinus rhythm Left ventricular hypertrophy Borderline prolonged QT interval Baseline wander in lead(s) V6 No significant change since last tracing Confirmed by Marily Memos [phone]) on 10/18/2016 5:38:02 PM       Radiology No results found.  Procedures Procedures (including critical care time)  Medications Ordered in  ED Medications  ondansetron (ZOFRAN-ODT) disintegrating tablet 4 mg (4 mg Oral Given 10/17/16 1010)  ondansetron (ZOFRAN) injection 4 mg (4 mg Intravenous Given 10/17/16 1108)  sodium chloride 0.9 % bolus 1,000 mL (0 mLs Intravenous Stopped 10/17/16 1230)  thiamine (B-1) injection 100 mg (100 mg Intravenous Given 10/17/16 1116)  LORazepam (ATIVAN) injection 1 mg (1 mg Intravenous Given 10/17/16 1117)  prochlorperazine (COMPAZINE) injection 10 mg (10 mg Intravenous Given 10/17/16 1116)  metoCLOPramide (REGLAN) injection 10 mg (10 mg Intravenous Given 10/17/16 1116)  diphenhydrAMINE (  BENADRYL) injection 25 mg (25 mg Intravenous Given 10/17/16 1116)     Initial Impression / Assessment and Plan / ED Course  I have reviewed the triage vital signs and the nursing notes.  Pertinent labs & imaging results that were available during my care of the patient were reviewed by me and considered in my medical decision making (see chart for details).  Clinical Course as of Oct 20 1608  Tue Oct 17, 2016  1037 Lactic Acid, Venous: (!!) 3.38 [CG]  1059 Pulse Rate: (!) 112 [CG]  1059 Temp: 97.6 F (36.4 C) [CG]  1408 Reevaluated patient and updated her onbenign workup so far. Vital signs have normalized. She appears significantly better, states headache has significantly improved 2/10 currently. Denies nausea, vomiting She is requesting fluids. Skin is dry, warm, no more generalized tremors. Abdomen nontender.  [CG]  1409 RN notified me CIWA score was never done prior to ativan, migraine cocktail or fluids.   [CG]    Clinical Course User Index [CG] Liberty Handy, PA-C   46 yo female presents for headache, nausea, vomiting, generalized tremors. +ETOH abuse. Fell yesterday and sustained laceration to right arm s/p repair, she did not hit her head. Initially on exam, she is clammy, looks anxious, vomiting, tachycardic and with generalized tremors, worst at hands.   Lab work significant for +opiates and THC.  All other work up including CBC, CMP, lipase, U/A, trop x 1, pregnancy negative. CT scan head negative. EKG with NSR.   Pt was given IVF, Gi cocktails, ativan, thiamine.  RN did not complete CIWA score prior to medications.   Final Clinical Impressions(s) / ED Diagnoses   Pt re-evaluated after medications. She is at baseline. She feels much better, headache, nausea, vomiting and tremors resolved. Suspecting ETOH withdrawal vs marijuana.  Pt ambulated and tolerate PO in ED. I don't think further emergent work up is indicated at this time. Hx, exam and work up not c/w ICH or other vascular etiology in this patient. Will d/c with antiemetics. Discussed s/s that would warrant return to ED. All questions answered.  Final diagnoses:  Nausea and vomiting in adult  Bad headache  Alcohol abuse    New Prescriptions Discharge Medication List as of 10/17/2016  3:30 PM    START taking these medications   Details  ondansetron (ZOFRAN ODT) 4 MG disintegrating tablet Take 1 tablet (4 mg total) by mouth every 8 (eight) hours as needed for nausea or vomiting., Starting Tue 10/17/2016, Print         Liberty Handy, PA-C 10/20/16 1610    Mesner, Barbara Cower, MD 10/20/16 2258

## 2016-10-17 NOTE — Discharge Instructions (Signed)
You presented to ED for evaluation of headache, nausea, and vomiting.   Your lab work, imaging were normal today. You improved after medications and fluids in ED.  It is still unclear what could have caused your symptoms.  Possible a stomach virus or withdrawal from alcohol. Please consider cutting back on your intake of alcohol.  Take zofran for nausea. Stay well hydrated. Call your primary care doctor for further discussion of your symptoms.  Return to ED if symptoms worsen, you develop fevers, chest pain, shortness of breath, uncontrollable vomiting, inability to control fluids by mouth, bloody BM, seizures

## 2016-10-17 NOTE — ED Triage Notes (Signed)
Pt presents for evaluation of nausea and vomiting with severe headache. States received stitches to R forearm in ED last night for cut by glass. Pt reports vomiting suddenly this AM, no hx of same from migraine. Denies abd pain or diarrhea.

## 2016-10-17 NOTE — ED Notes (Addendum)
Pt ambulatory to bathroom with RN at this time. Pt slow, but steady gait, no complaints of lightheadedness or dizziness with ambulation. Pt states headache has decreased from "8" to "2" on a 0-10 scale. Arm pain is only present with movement.

## 2016-10-17 NOTE — ED Notes (Signed)
Pt aware of need for urine  

## 2016-10-17 NOTE — ED Notes (Signed)
Patient transported to CT 

## 2016-10-20 DIAGNOSIS — M5416 Radiculopathy, lumbar region: Secondary | ICD-10-CM | POA: Diagnosis not present

## 2016-10-24 DIAGNOSIS — M545 Low back pain: Secondary | ICD-10-CM | POA: Diagnosis not present

## 2016-10-24 DIAGNOSIS — M5416 Radiculopathy, lumbar region: Secondary | ICD-10-CM | POA: Diagnosis not present

## 2016-10-27 DIAGNOSIS — R238 Other skin changes: Secondary | ICD-10-CM | POA: Diagnosis not present

## 2016-11-07 DIAGNOSIS — M5416 Radiculopathy, lumbar region: Secondary | ICD-10-CM | POA: Diagnosis not present

## 2016-12-28 DIAGNOSIS — M4310 Spondylolisthesis, site unspecified: Secondary | ICD-10-CM | POA: Diagnosis not present

## 2016-12-28 DIAGNOSIS — I10 Essential (primary) hypertension: Secondary | ICD-10-CM | POA: Diagnosis not present

## 2017-01-26 DIAGNOSIS — M5416 Radiculopathy, lumbar region: Secondary | ICD-10-CM | POA: Diagnosis not present

## 2017-02-28 ENCOUNTER — Ambulatory Visit
Admission: RE | Admit: 2017-02-28 | Discharge: 2017-02-28 | Disposition: A | Discharge status: Home / Self Care | Payer: BLUE CROSS/BLUE SHIELD | Source: Ambulatory Visit | Attending: Physician Assistant | Admitting: Physician Assistant

## 2017-02-28 ENCOUNTER — Other Ambulatory Visit: Payer: Self-pay | Admitting: Physician Assistant

## 2017-02-28 DIAGNOSIS — R05 Cough: Secondary | ICD-10-CM

## 2017-02-28 DIAGNOSIS — R0789 Other chest pain: Secondary | ICD-10-CM | POA: Diagnosis not present

## 2017-02-28 DIAGNOSIS — R059 Cough, unspecified: Secondary | ICD-10-CM

## 2017-02-28 DIAGNOSIS — K625 Hemorrhage of anus and rectum: Secondary | ICD-10-CM | POA: Diagnosis not present

## 2017-02-28 DIAGNOSIS — R198 Other specified symptoms and signs involving the digestive system and abdomen: Secondary | ICD-10-CM | POA: Diagnosis not present

## 2017-03-28 DIAGNOSIS — M5416 Radiculopathy, lumbar region: Secondary | ICD-10-CM | POA: Diagnosis not present

## 2017-04-03 DIAGNOSIS — M5416 Radiculopathy, lumbar region: Secondary | ICD-10-CM | POA: Diagnosis not present

## 2017-04-27 ENCOUNTER — Other Ambulatory Visit: Payer: Self-pay | Admitting: Family Medicine

## 2017-04-27 DIAGNOSIS — N6320 Unspecified lump in the left breast, unspecified quadrant: Secondary | ICD-10-CM

## 2017-04-27 DIAGNOSIS — Z0000 Encounter for general adult medical examination without abnormal findings: Secondary | ICD-10-CM | POA: Diagnosis not present

## 2017-04-27 DIAGNOSIS — I10 Essential (primary) hypertension: Secondary | ICD-10-CM | POA: Diagnosis not present

## 2017-04-27 DIAGNOSIS — R928 Other abnormal and inconclusive findings on diagnostic imaging of breast: Secondary | ICD-10-CM

## 2017-05-03 ENCOUNTER — Ambulatory Visit
Admission: RE | Admit: 2017-05-03 | Discharge: 2017-05-03 | Disposition: A | Discharge status: Home / Self Care | Payer: BLUE CROSS/BLUE SHIELD | Source: Ambulatory Visit | Attending: Family Medicine | Admitting: Family Medicine

## 2017-05-03 ENCOUNTER — Ambulatory Visit: Payer: BLUE CROSS/BLUE SHIELD

## 2017-05-03 DIAGNOSIS — R928 Other abnormal and inconclusive findings on diagnostic imaging of breast: Secondary | ICD-10-CM | POA: Diagnosis not present

## 2017-05-03 DIAGNOSIS — N6320 Unspecified lump in the left breast, unspecified quadrant: Secondary | ICD-10-CM

## 2017-06-21 DIAGNOSIS — N3281 Overactive bladder: Secondary | ICD-10-CM | POA: Diagnosis not present

## 2017-06-21 DIAGNOSIS — Z01411 Encounter for gynecological examination (general) (routine) with abnormal findings: Secondary | ICD-10-CM | POA: Diagnosis not present

## 2017-06-21 DIAGNOSIS — N951 Menopausal and female climacteric states: Secondary | ICD-10-CM | POA: Diagnosis not present

## 2017-07-27 DIAGNOSIS — M5416 Radiculopathy, lumbar region: Secondary | ICD-10-CM | POA: Diagnosis not present

## 2017-08-14 ENCOUNTER — Other Ambulatory Visit: Payer: Self-pay | Admitting: Orthopedic Surgery

## 2017-08-14 DIAGNOSIS — E876 Hypokalemia: Secondary | ICD-10-CM | POA: Diagnosis not present

## 2017-08-14 DIAGNOSIS — I10 Essential (primary) hypertension: Secondary | ICD-10-CM | POA: Diagnosis not present

## 2017-08-14 DIAGNOSIS — M545 Low back pain: Secondary | ICD-10-CM | POA: Diagnosis not present

## 2017-08-21 NOTE — Pre-Procedure Instructions (Signed)
Suzanne Jackson  08/21/2017      [facility] - [place] - [address] [place] [id number] Phone: [phone] Fax: [phone]  [id number] Suzanne Jackson- [place],  - [address] AT [facility] [address] [facility] [address] [name]  [place] [id number] Phone: [phone] Fax: [phone]    Your procedure is scheduled on August 29, 2017- "830AM-1130 AM" and August 30, 2017-"730AM-1030 AM"  [facility] at 630 AM.  Call this number if you have problems the morning of surgery:  [phone]   Remember:  Do not eat or drink after midnight.    Take these medicines the morning of surgery with A SIP OF WATER  Amlodipine (norvasc) Symbicort inhaler Proair inhaler-if needed Cetirizine (zyrtec) Fluoxetine (prozac) Hydrocodone-acetaminophen (norco)-if needed for pain Montelukast (singulair)   7 days prior to surgery STOP taking any Aspirin (unless otherwise instructed by your surgeon), Aleve, Naproxen, Ibuprofen, Motrin, Advil, Goody's, BC's, all herbal medications, fish oil, and all vitamins    Do not wear jewelry, make-up or nail polish.  Do not wear lotions, powders, or perfumes, or deodorant.  Do not shave 48 hours prior to surgery.    Do not bring valuables to the hospital.  [facility] is not responsible for any belongings or valuables.  Contacts, dentures or bridgework may not be worn into surgery.  Leave your suitcase in the car.  After surgery it may be brought to your room.  For patients admitted to the hospital, discharge time will be determined by your treatment team.  Patients discharged the day of surgery will not be allowed to drive home.   [place]- Preparing For Surgery  Before surgery, you can play an important role. Because skin is not sterile, your skin needs to be as free of germs as possible. You can reduce the number of germs on your skin by  washing with CHG (chlorahexidine gluconate) Soap before surgery.  CHG is an antiseptic cleaner which kills germs and bonds with the skin to continue killing germs even after washing.    Oral Hygiene is also important to reduce your risk of infection.  Remember - BRUSH YOUR TEETH THE MORNING OF SURGERY WITH YOUR REGULAR TOOTHPASTE  Please do not use if you have an allergy to CHG or antibacterial soaps. If your skin becomes reddened/irritated stop using the CHG.  Do not shave (including legs and underarms) for at least 48 hours prior to first CHG shower. It is OK to shave your face.  Please follow these instructions carefully.   1. Shower the NIGHT BEFORE SURGERY and the MORNING OF SURGERY with CHG.   2. If you chose to wash your hair, wash your hair first as usual with your normal shampoo.  3. After you shampoo, rinse your hair and body thoroughly to remove the shampoo.  4. Use CHG as you would any other liquid soap. You can apply CHG directly to the skin and wash gently with a scrungie or a clean washcloth.   5. Apply the CHG Soap to your body ONLY FROM THE NECK DOWN.  Do not use on open wounds or open sores. Avoid contact with your eyes, ears, mouth and genitals (private parts). Wash Face and genitals (private parts)  with your normal soap.  6. Wash thoroughly, paying special attention to the area where your surgery will be performed.  7. Thoroughly rinse your  body with warm water from the neck down.  8. DO NOT shower/wash with your normal soap after using and rinsing off the CHG Soap.  9. Pat yourself dry with a CLEAN TOWEL.  10. Wear CLEAN PAJAMAS to bed the night before surgery, wear comfortable clothes the morning of surgery  11. Place CLEAN SHEETS on your bed the night of your first shower and DO NOT SLEEP WITH PETS.  Day of Surgery:  Do not apply any deodorants/lotions.  Please wear clean clothes to the hospital/surgery center.   Remember to brush your teeth WITH YOUR  REGULAR TOOTHPASTE.   Please read over the following fact sheets that you were given. Pain Booklet, Coughing and Deep Breathing, MRSA Information and Surgical Site Infection Prevention

## 2017-08-22 ENCOUNTER — Encounter (HOSPITAL_COMMUNITY): Payer: Self-pay

## 2017-08-22 ENCOUNTER — Other Ambulatory Visit: Payer: Self-pay

## 2017-08-22 ENCOUNTER — Encounter (HOSPITAL_COMMUNITY)
Admission: RE | Admit: 2017-08-22 | Discharge: 2017-08-22 | Disposition: A | Discharge status: Home / Self Care | Payer: BLUE CROSS/BLUE SHIELD | Source: Ambulatory Visit | Attending: Orthopedic Surgery | Admitting: Orthopedic Surgery

## 2017-08-22 DIAGNOSIS — Z01812 Encounter for preprocedural laboratory examination: Secondary | ICD-10-CM | POA: Insufficient documentation

## 2017-08-22 DIAGNOSIS — M5416 Radiculopathy, lumbar region: Secondary | ICD-10-CM | POA: Diagnosis not present

## 2017-08-22 HISTORY — DX: Pain in leg, unspecified: M79.606

## 2017-08-22 HISTORY — DX: Essential (primary) hypertension: I10

## 2017-08-22 LAB — COMPREHENSIVE METABOLIC PANEL
ALK PHOS: 55 U/L (ref 38–126)
ALT: 15 U/L (ref 0–44)
ANION GAP: 14 (ref 5–15)
AST: 33 U/L (ref 15–41)
Albumin: 3.6 g/dL (ref 3.5–5.0)
BILIRUBIN TOTAL: 0.7 mg/dL (ref 0.3–1.2)
BUN: 11 mg/dL (ref 6–20)
CALCIUM: 8.9 mg/dL (ref 8.9–10.3)
CO2: 26 mmol/L (ref 22–32)
CREATININE: 0.66 mg/dL (ref 0.44–1.00)
Chloride: 98 mmol/L (ref 98–111)
GFR calc non Af Amer: >60 mL/min (ref >60)
Glucose, Bld: 103 mg/dL — ABNORMAL HIGH (ref 70–99)
Potassium: 3.4 mmol/L — ABNORMAL LOW (ref 3.5–5.1)
Sodium: 138 mmol/L (ref 135–145)
TOTAL PROTEIN: 6.7 g/dL (ref 6.5–8.1)

## 2017-08-22 LAB — URINALYSIS, ROUTINE W REFLEX MICROSCOPIC
BILIRUBIN URINE: NEGATIVE
Glucose, UA: NEGATIVE mg/dL
KETONES UR: NEGATIVE mg/dL
Leukocytes, UA: NEGATIVE
Nitrite: NEGATIVE
PH: 5 (ref 5.0–8.0)
Protein, ur: 30 mg/dL — AB
SPECIFIC GRAVITY, URINE: 1.017 (ref 1.005–1.030)

## 2017-08-22 LAB — CBC WITH DIFFERENTIAL/PLATELET
ABS IMMATURE GRANULOCYTES: 0 10*3/uL (ref 0.0–0.1)
BASOS ABS: 0.1 10*3/uL (ref 0.0–0.1)
Basophils Relative: 1 %
EOS PCT: 2 %
Eosinophils Absolute: 0.2 10*3/uL (ref 0.0–0.7)
HEMATOCRIT: 42 % (ref 36.0–46.0)
HEMOGLOBIN: 13.8 g/dL (ref 12.0–15.0)
Immature Granulocytes: 1 %
LYMPHS PCT: 18 %
Lymphs Abs: 1.5 10*3/uL (ref 0.7–4.0)
MCH: 34.2 pg — ABNORMAL HIGH (ref 26.0–34.0)
MCHC: 32.9 g/dL (ref 30.0–36.0)
MCV: 104 fL — AB (ref 78.0–100.0)
MONO ABS: 0.7 10*3/uL (ref 0.1–1.0)
MONOS PCT: 9 %
Neutro Abs: 5.6 10*3/uL (ref 1.7–7.7)
Neutrophils Relative %: 69 %
Platelets: 258 10*3/uL (ref 150–400)
RBC: 4.04 MIL/uL (ref 3.87–5.11)
RDW: 14 % (ref 11.5–15.5)
WBC: 8.1 10*3/uL (ref 4.0–10.5)

## 2017-08-22 LAB — TYPE AND SCREEN
ABO/RH(D): A POS
Antibody Screen: NEGATIVE

## 2017-08-22 LAB — ABO/RH: ABO/RH(D): A POS

## 2017-08-22 LAB — SURGICAL PCR SCREEN
MRSA, PCR: NEGATIVE
STAPHYLOCOCCUS AUREUS: POSITIVE — AB

## 2017-08-22 LAB — PROTIME-INR
INR: 0.95
Prothrombin Time: 12.6 seconds (ref 11.4–15.2)

## 2017-08-22 LAB — APTT: APTT: 28 s (ref 24–36)

## 2017-08-22 NOTE — Progress Notes (Signed)
PCP - Dr. Clovis RileyMitchell  Cardiologist - Denies  Chest x-ray - 03/01/17 (E)  EKG - 10/17/16 (E)  Stress Test - Denies  ECHO - Denies  Cardiac Cath - Denies  Sleep Study - Denies CPAP - None  LABS- 08/22/17: CBC w/D, CMP, PT, PTT, T/S, UA, PCR  ASA- Denies   Anesthesia- No  Pt denies having chest pain, sob, or fever at this time. All instructions explained to the pt, with a verbal understanding of the material. Pt agrees to go over the instructions while at home for a better understanding. The opportunity to ask questions was provided.

## 2017-08-22 NOTE — Progress Notes (Signed)
Pt made aware of pcr screen positive for Staph. [facility]

## 2017-08-29 ENCOUNTER — Inpatient Hospital Stay (HOSPITAL_COMMUNITY): Payer: BLUE CROSS/BLUE SHIELD | Admitting: Anesthesiology

## 2017-08-29 ENCOUNTER — Inpatient Hospital Stay (HOSPITAL_COMMUNITY): Payer: BLUE CROSS/BLUE SHIELD

## 2017-08-29 ENCOUNTER — Inpatient Hospital Stay (HOSPITAL_COMMUNITY)
Admission: RE | Disposition: A | Discharge status: Home / Self Care | Payer: Self-pay | Source: Ambulatory Visit | Attending: Orthopedic Surgery

## 2017-08-29 ENCOUNTER — Inpatient Hospital Stay (HOSPITAL_COMMUNITY)
Admission: RE | Admit: 2017-08-29 | Discharge: 2017-08-31 | DRG: 455 | Disposition: A | Discharge status: Home / Self Care | Payer: BLUE CROSS/BLUE SHIELD | Source: Ambulatory Visit | Attending: Orthopedic Surgery | Admitting: Orthopedic Surgery

## 2017-08-29 ENCOUNTER — Inpatient Hospital Stay (HOSPITAL_COMMUNITY): Payer: BLUE CROSS/BLUE SHIELD | Admitting: Vascular Surgery

## 2017-08-29 ENCOUNTER — Other Ambulatory Visit: Payer: Self-pay

## 2017-08-29 ENCOUNTER — Encounter (HOSPITAL_COMMUNITY): Payer: Self-pay | Admitting: Anesthesiology

## 2017-08-29 DIAGNOSIS — J45909 Unspecified asthma, uncomplicated: Secondary | ICD-10-CM | POA: Diagnosis not present

## 2017-08-29 DIAGNOSIS — M5416 Radiculopathy, lumbar region: Secondary | ICD-10-CM

## 2017-08-29 DIAGNOSIS — F17210 Nicotine dependence, cigarettes, uncomplicated: Secondary | ICD-10-CM | POA: Diagnosis present

## 2017-08-29 DIAGNOSIS — Z886 Allergy status to analgesic agent status: Secondary | ICD-10-CM

## 2017-08-29 DIAGNOSIS — Z885 Allergy status to narcotic agent status: Secondary | ICD-10-CM

## 2017-08-29 DIAGNOSIS — M4326 Fusion of spine, lumbar region: Secondary | ICD-10-CM | POA: Diagnosis not present

## 2017-08-29 DIAGNOSIS — M48061 Spinal stenosis, lumbar region without neurogenic claudication: Secondary | ICD-10-CM | POA: Diagnosis not present

## 2017-08-29 DIAGNOSIS — Z7951 Long term (current) use of inhaled steroids: Secondary | ICD-10-CM

## 2017-08-29 DIAGNOSIS — Z981 Arthrodesis status: Secondary | ICD-10-CM | POA: Diagnosis not present

## 2017-08-29 DIAGNOSIS — M4316 Spondylolisthesis, lumbar region: Secondary | ICD-10-CM | POA: Diagnosis not present

## 2017-08-29 DIAGNOSIS — L299 Pruritus, unspecified: Secondary | ICD-10-CM | POA: Diagnosis not present

## 2017-08-29 DIAGNOSIS — Z79899 Other long term (current) drug therapy: Secondary | ICD-10-CM | POA: Diagnosis not present

## 2017-08-29 DIAGNOSIS — F329 Major depressive disorder, single episode, unspecified: Secondary | ICD-10-CM | POA: Diagnosis present

## 2017-08-29 DIAGNOSIS — T84296A Other mechanical complication of internal fixation device of vertebrae, initial encounter: Secondary | ICD-10-CM | POA: Diagnosis not present

## 2017-08-29 DIAGNOSIS — I10 Essential (primary) hypertension: Secondary | ICD-10-CM | POA: Diagnosis not present

## 2017-08-29 DIAGNOSIS — M5410 Radiculopathy, site unspecified: Secondary | ICD-10-CM | POA: Diagnosis present

## 2017-08-29 DIAGNOSIS — M79605 Pain in left leg: Secondary | ICD-10-CM | POA: Diagnosis not present

## 2017-08-29 DIAGNOSIS — Z419 Encounter for procedure for purposes other than remedying health state, unspecified: Secondary | ICD-10-CM

## 2017-08-29 HISTORY — PX: ANTERIOR LAT LUMBAR FUSION: SHX1168

## 2017-08-29 LAB — POCT PREGNANCY, URINE: Preg Test, Ur: NEGATIVE

## 2017-08-29 SURGERY — ANTERIOR LATERAL LUMBAR FUSION 1 LEVEL
Anesthesia: General

## 2017-08-29 MED ORDER — FENTANYL CITRATE (PF) 100 MCG/2ML IJ SOLN
INTRAMUSCULAR | Status: DC | PRN
  Administered 2017-08-29 (×2): 50 ug via INTRAVENOUS
  Administered 2017-08-29: 100 ug via INTRAVENOUS
  Administered 2017-08-29: 50 ug via INTRAVENOUS
  Administered 2017-08-29: 100 ug via INTRAVENOUS
  Administered 2017-08-29: 50 ug via INTRAVENOUS

## 2017-08-29 MED ORDER — OXYCODONE HCL 5 MG/5ML PO SOLN: 5.0000 mg | Freq: Once | ORAL | Status: DC | PRN

## 2017-08-29 MED ORDER — MORPHINE SULFATE (PF) 2 MG/ML IV SOLN
1.0000 mg | INTRAVENOUS | Status: DC | PRN
  Filled 2017-08-29: qty 1

## 2017-08-29 MED ORDER — DIPHENHYDRAMINE HCL 50 MG/ML IJ SOLN
12.5000 mg | Freq: Once | INTRAMUSCULAR | Status: AC
Start: 2017-08-29 — End: 2017-08-29
  Administered 2017-08-29: 12.5 mg via INTRAVENOUS

## 2017-08-29 MED ORDER — OXYCODONE-ACETAMINOPHEN 5-325 MG PO TABS
1.0000 | ORAL_TABLET | ORAL | Status: DC | PRN
  Administered 2017-08-29: 2 via ORAL
  Filled 2017-08-29: qty 2

## 2017-08-29 MED ORDER — OXYCODONE HCL 5 MG PO TABS: 5.0000 mg | ORAL_TABLET | Freq: Once | ORAL | Status: DC | PRN

## 2017-08-29 MED ORDER — HYDROCHLOROTHIAZIDE 12.5 MG PO CAPS
12.5000 mg | ORAL_CAPSULE | Freq: Every day | ORAL | Status: DC
  Filled 2017-08-29: qty 1

## 2017-08-29 MED ORDER — SODIUM CHLORIDE 0.9% FLUSH: 3.0000 mL | INTRAVENOUS | Status: DC | PRN

## 2017-08-29 MED ORDER — MIDAZOLAM HCL 2 MG/2ML IJ SOLN
INTRAMUSCULAR | Status: AC
  Filled 2017-08-29: qty 2

## 2017-08-29 MED ORDER — PROPOFOL 10 MG/ML IV BOLUS
INTRAVENOUS | Status: AC
  Filled 2017-08-29: qty 40

## 2017-08-29 MED ORDER — ACETAMINOPHEN 325 MG PO TABS: 650.0000 mg | ORAL_TABLET | ORAL | Status: DC | PRN

## 2017-08-29 MED ORDER — SENNOSIDES-DOCUSATE SODIUM 8.6-50 MG PO TABS: 1.0000 | ORAL_TABLET | Freq: Every evening | ORAL | Status: DC | PRN

## 2017-08-29 MED ORDER — MENTHOL 3 MG MT LOZG: 1.0000 | LOZENGE | OROMUCOSAL | Status: DC | PRN

## 2017-08-29 MED ORDER — FENTANYL CITRATE (PF) 250 MCG/5ML IJ SOLN
INTRAMUSCULAR | Status: AC
  Filled 2017-08-29: qty 5

## 2017-08-29 MED ORDER — ONDANSETRON HCL 4 MG PO TABS: 4.0000 mg | ORAL_TABLET | Freq: Four times a day (QID) | ORAL | Status: DC | PRN

## 2017-08-29 MED ORDER — PHENYLEPHRINE HCL 10 MG/ML IJ SOLN
INTRAMUSCULAR | Status: DC | PRN
  Administered 2017-08-29: 80 ug via INTRAVENOUS
  Administered 2017-08-29: 40 ug via INTRAVENOUS
  Administered 2017-08-29: 80 ug via INTRAVENOUS

## 2017-08-29 MED ORDER — PHENYLEPHRINE 40 MCG/ML (10ML) SYRINGE FOR IV PUSH (FOR BLOOD PRESSURE SUPPORT)
PREFILLED_SYRINGE | INTRAVENOUS | Status: AC
  Filled 2017-08-29: qty 10

## 2017-08-29 MED ORDER — PHENOL 1.4 % MT LIQD: 1.0000 | OROMUCOSAL | Status: DC | PRN

## 2017-08-29 MED ORDER — CEFAZOLIN SODIUM-DEXTROSE 2-4 GM/100ML-% IV SOLN
2.0000 g | INTRAVENOUS | Status: AC
  Administered 2017-08-29: 2 g via INTRAVENOUS
  Filled 2017-08-29: qty 100

## 2017-08-29 MED ORDER — FLUOXETINE HCL 20 MG PO CAPS
40.0000 mg | ORAL_CAPSULE | Freq: Every morning | ORAL | Status: DC
  Administered 2017-08-30: 40 mg via ORAL
  Filled 2017-08-29 (×2): qty 2

## 2017-08-29 MED ORDER — THROMBIN (RECOMBINANT) 20000 UNITS EX SOLR
CUTANEOUS | Status: AC
  Filled 2017-08-29: qty 20000

## 2017-08-29 MED ORDER — LACTATED RINGERS IV SOLN
INTRAVENOUS | Status: DC | PRN
  Administered 2017-08-29 (×2): via INTRAVENOUS

## 2017-08-29 MED ORDER — DIPHENHYDRAMINE HCL 50 MG/ML IJ SOLN
INTRAMUSCULAR | Status: AC
  Administered 2017-08-29: 13:00:00
  Filled 2017-08-29: qty 1

## 2017-08-29 MED ORDER — DOCUSATE SODIUM 100 MG PO CAPS
100.0000 mg | ORAL_CAPSULE | Freq: Two times a day (BID) | ORAL | Status: DC
  Administered 2017-08-29 – 2017-08-30 (×3): 100 mg via ORAL
  Filled 2017-08-29 (×4): qty 1

## 2017-08-29 MED ORDER — ONDANSETRON HCL 4 MG/2ML IJ SOLN: 4.0000 mg | Freq: Four times a day (QID) | INTRAMUSCULAR | Status: DC | PRN

## 2017-08-29 MED ORDER — LORATADINE 10 MG PO TABS
10.0000 mg | ORAL_TABLET | Freq: Every day | ORAL | Status: DC
  Administered 2017-08-30: 10 mg via ORAL
  Filled 2017-08-29 (×2): qty 1

## 2017-08-29 MED ORDER — BUPIVACAINE-EPINEPHRINE 0.25% -1:200000 IJ SOLN
INTRAMUSCULAR | Status: AC
  Filled 2017-08-29: qty 1

## 2017-08-29 MED ORDER — DIAZEPAM 5 MG PO TABS
5.0000 mg | ORAL_TABLET | Freq: Four times a day (QID) | ORAL | Status: DC | PRN
  Administered 2017-08-29 – 2017-08-30 (×3): 5 mg via ORAL
  Filled 2017-08-29 (×3): qty 1

## 2017-08-29 MED ORDER — ALBUTEROL SULFATE (2.5 MG/3ML) 0.083% IN NEBU: 3.0000 mL | INHALATION_SOLUTION | Freq: Four times a day (QID) | RESPIRATORY_TRACT | Status: DC | PRN

## 2017-08-29 MED ORDER — CEFAZOLIN SODIUM-DEXTROSE 2-4 GM/100ML-% IV SOLN
2.0000 g | Freq: Three times a day (TID) | INTRAVENOUS | Status: AC
  Administered 2017-08-29 – 2017-08-30 (×2): 2 g via INTRAVENOUS
  Filled 2017-08-29 (×2): qty 100

## 2017-08-29 MED ORDER — MONTELUKAST SODIUM 10 MG PO TABS
10.0000 mg | ORAL_TABLET | Freq: Every morning | ORAL | Status: DC
  Administered 2017-08-30: 10 mg via ORAL
  Filled 2017-08-29 (×3): qty 1

## 2017-08-29 MED ORDER — ZOLPIDEM TARTRATE 5 MG PO TABS
5.0000 mg | ORAL_TABLET | Freq: Every evening | ORAL | Status: DC | PRN
  Administered 2017-08-30: 5 mg via ORAL
  Filled 2017-08-29: qty 1

## 2017-08-29 MED ORDER — BUPIVACAINE-EPINEPHRINE 0.25% -1:200000 IJ SOLN
INTRAMUSCULAR | Status: DC | PRN
  Administered 2017-08-29: 10 mL

## 2017-08-29 MED ORDER — POTASSIUM CHLORIDE IN NACL 20-0.9 MEQ/L-% IV SOLN
INTRAVENOUS | Status: DC
  Administered 2017-08-29 (×2): via INTRAVENOUS
  Filled 2017-08-29: qty 1000

## 2017-08-29 MED ORDER — 0.9 % SODIUM CHLORIDE (POUR BTL) OPTIME
TOPICAL | Status: DC | PRN
  Administered 2017-08-29: 1000 mL

## 2017-08-29 MED ORDER — BUPIVACAINE LIPOSOME 1.3 % IJ SUSP
20.0000 mL | INTRAMUSCULAR | Status: DC
  Filled 2017-08-29: qty 20

## 2017-08-29 MED ORDER — SUCCINYLCHOLINE CHLORIDE 200 MG/10ML IV SOSY
PREFILLED_SYRINGE | INTRAVENOUS | Status: AC
  Filled 2017-08-29: qty 10

## 2017-08-29 MED ORDER — ACETAMINOPHEN 160 MG/5ML PO SOLN: 325.0000 mg | ORAL | Status: DC | PRN

## 2017-08-29 MED ORDER — FENTANYL CITRATE (PF) 100 MCG/2ML IJ SOLN
25.0000 ug | INTRAMUSCULAR | Status: DC | PRN
  Administered 2017-08-29 (×2): 25 ug via INTRAVENOUS
  Administered 2017-08-29: 50 ug via INTRAVENOUS

## 2017-08-29 MED ORDER — BISACODYL 5 MG PO TBEC: 5.0000 mg | DELAYED_RELEASE_TABLET | Freq: Every day | ORAL | Status: DC | PRN

## 2017-08-29 MED ORDER — SUCCINYLCHOLINE CHLORIDE 20 MG/ML IJ SOLN
INTRAMUSCULAR | Status: DC | PRN
  Administered 2017-08-29: 60 mg via INTRAVENOUS

## 2017-08-29 MED ORDER — HYDROMORPHONE HCL 1 MG/ML IJ SOLN
0.5000 mg | INTRAMUSCULAR | Status: DC | PRN
  Administered 2017-08-29: 1 mg via INTRAVENOUS
  Filled 2017-08-29: qty 1

## 2017-08-29 MED ORDER — FENTANYL CITRATE (PF) 100 MCG/2ML IJ SOLN
INTRAMUSCULAR | Status: AC
  Administered 2017-08-29: 50 ug via INTRAVENOUS
  Filled 2017-08-29: qty 2

## 2017-08-29 MED ORDER — SODIUM CHLORIDE 0.9 % IV SOLN: 250.0000 mL | INTRAVENOUS | Status: DC

## 2017-08-29 MED ORDER — ACETAMINOPHEN 650 MG RE SUPP: 650.0000 mg | RECTAL | Status: DC | PRN

## 2017-08-29 MED ORDER — SODIUM CHLORIDE 0.9 % IV SOLN
INTRAVENOUS | Status: DC | PRN
  Administered 2017-08-29: 15 ug/min via INTRAVENOUS

## 2017-08-29 MED ORDER — MOMETASONE FURO-FORMOTEROL FUM 200-5 MCG/ACT IN AERO
2.0000 | INHALATION_SPRAY | Freq: Two times a day (BID) | RESPIRATORY_TRACT | Status: DC
  Administered 2017-08-30: 2 via RESPIRATORY_TRACT
  Filled 2017-08-29: qty 8.8

## 2017-08-29 MED ORDER — ACETAMINOPHEN 325 MG PO TABS: 325.0000 mg | ORAL_TABLET | ORAL | Status: DC | PRN

## 2017-08-29 MED ORDER — PROPOFOL 500 MG/50ML IV EMUL
INTRAVENOUS | Status: DC | PRN
  Administered 2017-08-29: 25 ug/kg/min via INTRAVENOUS

## 2017-08-29 MED ORDER — POVIDONE-IODINE 7.5 % EX SOLN
Freq: Once | CUTANEOUS | Status: DC
  Filled 2017-08-29: qty 118

## 2017-08-29 MED ORDER — EPHEDRINE 5 MG/ML INJ
INTRAVENOUS | Status: AC
  Filled 2017-08-29: qty 10

## 2017-08-29 MED ORDER — THROMBIN 20000 UNITS EX SOLR
CUTANEOUS | Status: DC | PRN
  Administered 2017-08-29: 09:00:00 via TOPICAL

## 2017-08-29 MED ORDER — ONDANSETRON HCL 4 MG/2ML IJ SOLN
INTRAMUSCULAR | Status: DC | PRN
  Administered 2017-08-29: 4 mg via INTRAVENOUS

## 2017-08-29 MED ORDER — HYDROMORPHONE HCL 1 MG/ML IJ SOLN
0.5000 mg | Freq: Four times a day (QID) | INTRAMUSCULAR | Status: DC | PRN
  Administered 2017-08-29: 1 mg via INTRAVENOUS
  Filled 2017-08-29: qty 1

## 2017-08-29 MED ORDER — MIDAZOLAM HCL 5 MG/5ML IJ SOLN
INTRAMUSCULAR | Status: DC | PRN
  Administered 2017-08-29 (×2): 1 mg via INTRAVENOUS

## 2017-08-29 MED ORDER — ALUM & MAG HYDROXIDE-SIMETH 200-200-20 MG/5ML PO SUSP: 30.0000 mL | Freq: Four times a day (QID) | ORAL | Status: DC | PRN

## 2017-08-29 MED ORDER — DIPHENHYDRAMINE HCL 25 MG PO CAPS
25.0000 mg | ORAL_CAPSULE | Freq: Four times a day (QID) | ORAL | Status: DC | PRN
  Administered 2017-08-29: 25 mg via ORAL
  Filled 2017-08-29: qty 1

## 2017-08-29 MED ORDER — PANTOPRAZOLE SODIUM 40 MG PO TBEC
40.0000 mg | DELAYED_RELEASE_TABLET | Freq: Every day | ORAL | Status: DC
  Administered 2017-08-30: 40 mg via ORAL
  Filled 2017-08-29 (×3): qty 1

## 2017-08-29 MED ORDER — PROPOFOL 10 MG/ML IV BOLUS
INTRAVENOUS | Status: DC | PRN
  Administered 2017-08-29: 20 mg via INTRAVENOUS
  Administered 2017-08-29: 120 mg via INTRAVENOUS
  Administered 2017-08-29: 30 mg via INTRAVENOUS
  Administered 2017-08-29 (×2): 20 mg via INTRAVENOUS
  Administered 2017-08-29: 60 mg via INTRAVENOUS
  Administered 2017-08-29: 30 mg via INTRAVENOUS

## 2017-08-29 MED ORDER — LIDOCAINE 2% (20 MG/ML) 5 ML SYRINGE
INTRAMUSCULAR | Status: AC
  Filled 2017-08-29: qty 5

## 2017-08-29 MED ORDER — POTASSIUM CHLORIDE CRYS ER 20 MEQ PO TBCR
20.0000 meq | EXTENDED_RELEASE_TABLET | Freq: Every day | ORAL | Status: DC
Start: 2017-08-29 — End: 2017-08-31
  Administered 2017-08-29: 20 meq via ORAL
  Filled 2017-08-29 (×2): qty 1

## 2017-08-29 MED ORDER — SODIUM CHLORIDE 0.9% FLUSH
3.0000 mL | Freq: Two times a day (BID) | INTRAVENOUS | Status: DC
  Administered 2017-08-29 – 2017-08-30 (×3): 3 mL via INTRAVENOUS

## 2017-08-29 MED ORDER — EPHEDRINE SULFATE 50 MG/ML IJ SOLN
INTRAMUSCULAR | Status: DC | PRN
  Administered 2017-08-29: 10 mg via INTRAVENOUS
  Administered 2017-08-29: 5 mg via INTRAVENOUS

## 2017-08-29 MED ORDER — AMLODIPINE BESYLATE 5 MG PO TABS: 10.0000 mg | ORAL_TABLET | Freq: Every day | ORAL | Status: DC

## 2017-08-29 MED ORDER — FLEET ENEMA 7-19 GM/118ML RE ENEM: 1.0000 | ENEMA | Freq: Once | RECTAL | Status: DC | PRN

## 2017-08-29 MED ORDER — ONDANSETRON HCL 4 MG/2ML IJ SOLN
INTRAMUSCULAR | Status: AC
  Filled 2017-08-29: qty 2

## 2017-08-29 MED ORDER — LIDOCAINE HCL (CARDIAC) PF 100 MG/5ML IV SOSY
PREFILLED_SYRINGE | INTRAVENOUS | Status: DC | PRN
  Administered 2017-08-29: 100 mg via INTRAVENOUS

## 2017-08-29 SURGICAL SUPPLY — 68 items
APL SKNCLS STERI-STRIP NONHPOA (GAUZE/BANDAGES/DRESSINGS) ×1
BENZOIN TINCTURE PRP APPL 2/3 (GAUZE/BANDAGES/DRESSINGS) ×1 IMPLANT
BLADE CLIPPER SURG (BLADE) IMPLANT
BLADE SURG 10 STRL SS (BLADE) ×2 IMPLANT
BOLT 5.0X45 SPINAL (Bolt) ×2 IMPLANT
BONE MATRIX VIVIGEN 16.CC/XL (Bone Implant) ×2 IMPLANT
CAGE COROENT XL 12X18X50 (Cage) ×1 IMPLANT
COVER BACK TABLE 80X110 HD (DRAPES) ×2 IMPLANT
COVER SURGICAL LIGHT HANDLE (MISCELLANEOUS) ×1 IMPLANT
DRAPE C-ARM 42X72 X-RAY (DRAPES) ×2 IMPLANT
DRAPE C-ARMOR (DRAPES) ×2 IMPLANT
DRAPE POUCH INSTRU U-SHP 10X18 (DRAPES) ×2 IMPLANT
DRAPE SURG 17X23 STRL (DRAPES) ×8 IMPLANT
DURAPREP 26ML APPLICATOR (WOUND CARE) ×2 IMPLANT
ELECT BLADE 6.5 EXT (BLADE) ×2 IMPLANT
ELECT CAUTERY BLADE 6.4 (BLADE) ×2 IMPLANT
ELECT REM PT RETURN 9FT ADLT (ELECTROSURGICAL) ×2
ELECTRODE REM PT RTRN 9FT ADLT (ELECTROSURGICAL) ×1 IMPLANT
GAUZE 4X4 16PLY RFD (DISPOSABLE) ×2 IMPLANT
GAUZE SPONGE 4X4 12PLY STRL (GAUZE/BANDAGES/DRESSINGS) ×1 IMPLANT
GLOVE BIO SURGEON STRL SZ7 (GLOVE) ×4 IMPLANT
GLOVE BIO SURGEON STRL SZ8 (GLOVE) ×2 IMPLANT
GLOVE BIOGEL PI IND STRL 7.0 (GLOVE) ×1 IMPLANT
GLOVE BIOGEL PI IND STRL 8 (GLOVE) ×1 IMPLANT
GLOVE BIOGEL PI INDICATOR 7.0 (GLOVE) ×1
GLOVE BIOGEL PI INDICATOR 8 (GLOVE) ×1
GOWN STRL REUS W/ TWL LRG LVL3 (GOWN DISPOSABLE) ×1 IMPLANT
GOWN STRL REUS W/ TWL XL LVL3 (GOWN DISPOSABLE) ×2 IMPLANT
GOWN STRL REUS W/TWL LRG LVL3 (GOWN DISPOSABLE) ×4
GOWN STRL REUS W/TWL XL LVL3 (GOWN DISPOSABLE) ×4
GRAFT BNE MATRIX VG XL 16 (Bone Implant) IMPLANT
KIT BASIN OR (CUSTOM PROCEDURE TRAY) ×2 IMPLANT
KIT DILATOR XLIF 5 (KITS) ×1 IMPLANT
KIT SURGICAL ACCESS MAXCESS 4 (KITS) ×1 IMPLANT
KIT TURNOVER KIT B (KITS) ×2 IMPLANT
MARKER SKIN DUAL TIP RULER LAB (MISCELLANEOUS) ×2 IMPLANT
MODULE EMG NDL SSEP NVM5 (NEEDLE) IMPLANT
MODULE EMG NEEDLE SSEP NVM5 (NEEDLE) ×2 IMPLANT
MODULE NVM5 NEXT GEN EMG (NEEDLE) ×1 IMPLANT
NDL HYPO 25GX1X1/2 BEV (NEEDLE) ×1 IMPLANT
NDL SPNL 18GX3.5 QUINCKE PK (NEEDLE) ×1 IMPLANT
NEEDLE HYPO 25GX1X1/2 BEV (NEEDLE) ×2 IMPLANT
NEEDLE SPNL 18GX3.5 QUINCKE PK (NEEDLE) ×2 IMPLANT
NS IRRIG 1000ML POUR BTL (IV SOLUTION) ×4 IMPLANT
PACK LAMINECTOMY ORTHO (CUSTOM PROCEDURE TRAY) ×2 IMPLANT
PACK UNIVERSAL I (CUSTOM PROCEDURE TRAY) ×2 IMPLANT
PAD ARMBOARD 7.5X6 YLW CONV (MISCELLANEOUS) ×4 IMPLANT
PLATE DECADE XLIP 2H SZ12 (Plate) ×1 IMPLANT
SPONGE INTESTINAL PEANUT (DISPOSABLE) ×5 IMPLANT
SPONGE LAP 4X18 RFD (DISPOSABLE) ×2 IMPLANT
SPONGE SURGIFOAM ABS GEL 100 (HEMOSTASIS) ×1 IMPLANT
STAPLER VISISTAT 35W (STAPLE) ×2 IMPLANT
STRIP CLOSURE SKIN 1/2X4 (GAUZE/BANDAGES/DRESSINGS) IMPLANT
SURGIFLO W/THROMBIN 8M KIT (HEMOSTASIS) IMPLANT
SUT MNCRL AB 4-0 PS2 18 (SUTURE) ×2 IMPLANT
SUT VIC AB 0 CT1 18XCR BRD 8 (SUTURE) ×1 IMPLANT
SUT VIC AB 0 CT1 8-18 (SUTURE) ×2
SUT VIC AB 1 CT1 18XCR BRD 8 (SUTURE) ×1 IMPLANT
SUT VIC AB 1 CT1 8-18 (SUTURE) ×2
SUT VIC AB 2-0 CT2 18 VCP726D (SUTURE) ×2 IMPLANT
SYR BULB IRRIGATION 50ML (SYRINGE) ×2 IMPLANT
SYR CONTROL 10ML LL (SYRINGE) ×2 IMPLANT
TAPE CLOTH SURG 4X10 WHT LF (GAUZE/BANDAGES/DRESSINGS) ×1 IMPLANT
TOWEL OR 17X24 6PK STRL BLUE (TOWEL DISPOSABLE) ×2 IMPLANT
TOWEL OR 17X26 10 PK STRL BLUE (TOWEL DISPOSABLE) ×2 IMPLANT
TRAY FOLEY CATH SILVER 16FR (SET/KITS/TRAYS/PACK) ×2 IMPLANT
WATER STERILE IRR 1000ML POUR (IV SOLUTION) ×2 IMPLANT
YANKAUER SUCT BULB TIP NO VENT (SUCTIONS) ×2 IMPLANT

## 2017-08-29 NOTE — Anesthesia Postprocedure Evaluation (Signed)
Anesthesia Post Note  Patient: Suzanne Jackson  Procedure(s) Performed: LUMBAR FOUR-FIVE LATERAL INTERBODY FUSION WITH INSTRUMENTATION AND ALLOGRAFT (N/A )     Patient location during evaluation: PACU Anesthesia Type: General Level of consciousness: awake and alert Pain management: pain level controlled Vital Signs Assessment: post-procedure vital signs reviewed and stable Respiratory status: spontaneous breathing, nonlabored ventilation, respiratory function stable and patient connected to nasal cannula oxygen Cardiovascular status: blood pressure returned to baseline and stable Postop Assessment: no apparent nausea or vomiting Anesthetic complications: no    Last Vitals:  Vitals:   08/29/17 1243 08/29/17 1256  BP: (!) 143/79 (!) 152/74  Pulse: 88 92  Resp: 17 16  Temp: 36.4 C (!) 36.4 C  SpO2: 97% 97%    Last Pain:  Vitals:   08/29/17 1256  TempSrc: Oral  PainSc:                  [name]

## 2017-08-29 NOTE — Anesthesia Preprocedure Evaluation (Signed)
Anesthesia Evaluation    Reviewed: Allergy & Precautions, NPO status , Patient's Chart, lab work & pertinent test results  History of Anesthesia Complications Negative for: history of anesthetic complications  Airway Mallampati: I  TM Distance: >3 FB Neck ROM: Full    Dental  (+) Teeth Intact,    Pulmonary neg shortness of breath, neg sleep apnea, COPD,  COPD inhaler, neg recent URI, Current Smoker,    breath sounds clear to auscultation       Cardiovascular hypertension, Pt. on medications (-) angina(-) Past MI and (-) CHF  Rhythm:Regular     Neuro/Psych PSYCHIATRIC DISORDERS Depression  Neuromuscular disease    GI/Hepatic negative GI ROS, Neg liver ROS,   Endo/Other  negative endocrine ROS  Renal/GU negative Renal ROS     Musculoskeletal  (+) Arthritis ,   Abdominal   Peds  Hematology negative hematology ROS (+)   Anesthesia Other Findings   Reproductive/Obstetrics                             Anesthesia Physical Anesthesia Plan  ASA: II  Anesthesia Plan: General   Post-op Pain Management:    Induction: Intravenous  PONV Risk Score and Plan: 2 and Ondansetron and Dexamethasone  Airway Management Planned: Oral ETT  Additional Equipment: None  Intra-op Plan:   Post-operative Plan: Extubation in OR  Informed Consent: I have reviewed the patients History and Physical, chart, labs and discussed the procedure including the risks, benefits and alternatives for the proposed anesthesia with the patient or authorized representative who has indicated his/her understanding and acceptance.   Dental advisory given  Plan Discussed with: CRNA and Surgeon  Anesthesia Plan Comments:         Anesthesia Quick Evaluation

## 2017-08-29 NOTE — Transfer of Care (Signed)
Immediate Anesthesia Transfer of Care Note  Patient: Octaviano Battyeresa D Kovacic  Procedure(s) Performed: LUMBAR FOUR-FIVE LATERAL INTERBODY FUSION WITH INSTRUMENTATION AND ALLOGRAFT (N/A )  Patient Location: PACU  Anesthesia Type:General  Level of Consciousness: awake, alert , oriented and sedated  Airway & Oxygen Therapy: Patient Spontanous Breathing and Patient connected to nasal cannula oxygen  Post-op Assessment: Report given to RN, Post -op Vital signs reviewed and stable and Patient moving all extremities  Post vital signs: Reviewed and stable  Last Vitals:  Vitals Value Taken Time  BP    Temp    Pulse 97 08/29/2017 11:26 AM  Resp 15 08/29/2017 11:26 AM  SpO2 97 % 08/29/2017 11:26 AM  Vitals shown include unvalidated device data.  Last Pain:  Vitals:   08/29/17 0714  TempSrc: Oral  PainSc:       Patients Stated Pain Goal: 3 (08/29/17 0705)  Complications: No apparent anesthesia complications

## 2017-08-29 NOTE — Plan of Care (Signed)
  Problem: Safety: Goal: Ability to remain free from injury will improve Outcome: Progressing   Problem: Education: Goal: Knowledge of General Education information will improve Description Including pain rating scale, medication(s)/side effects and non-pharmacologic comfort measures Outcome: Progressing   Problem: Health Behavior/Discharge Planning: Goal: Ability to manage health-related needs will improve Outcome: Progressing   Problem: Activity: Goal: Risk for activity intolerance will decrease Outcome: Progressing   Problem: Nutrition: Goal: Adequate nutrition will be maintained Outcome: Progressing   Problem: Coping: Goal: Level of anxiety will decrease Outcome: Progressing   Problem: Elimination: Goal: Will not experience complications related to bowel motility Outcome: Progressing   Problem: Pain Managment: Goal: General experience of comfort will improve Outcome: Progressing

## 2017-08-29 NOTE — H&P (Signed)
PREOPERATIVE H&P  Chief Complaint: Left leg pain  HPI: Suzanne Jackson is a 47 y.o. female who presents with ongoing pain in the left leg. Xrays reveal a high grade 1 spondy. Patient is s/p a previous L5/S1 fusion procedure.   MRI reveals severe stenosis at L4/5, the level above the patient's previous fusion.   Patient has failed multiple forms of conservative care and continues to have pain (see office notes for additional details regarding the patient's full course of treatment)  Past Medical History:  Diagnosis Date  . Arthritis    BACK  . Asthma   . Depression   . Frequency of urination   . History of kidney stones   . Hypertension   . Leg pain    Left  . Microhematuria   . Right ureteral stone   . Wears contact lenses    Past Surgical History:  Procedure Laterality Date  . CYSTO/  URETEROSCOPIC STONE EXTRACTION  1998  . CYSTOSCOPY WITH RETROGRADE PYELOGRAM, URETEROSCOPY AND STENT PLACEMENT Right 02/12/2015   Procedure: CYSTOSCOPY WITH RETROGRADE PYELOGRAM, URETEROSCOPY;  Surgeon: Jethro BolusSigmund Tannenbaum, MD;  Location: [facility];  Service: Urology;  Laterality: Right;  . LAPAROSCOPIC CHOLECYSTECTOMY  1994  . LAPAROSCOPIC TUBAL LIGATION Bilateral 05-06-2009   fulgeration  . POSTERIOR FUSION LUMBAR SPINE  10-28-2008   L5 - S1  . STONE EXTRACTION WITH BASKET Right 02/12/2015   Procedure: STONE EXTRACTION WITH BASKET AND LASER LITHOTRIPSY;  Surgeon: Jethro BolusSigmund Tannenbaum, MD;  [facility];  Service: Urology;  Laterality: Right;  . TUBAL LIGATION     Social History   Socioeconomic History  . Marital status: Married    Spouse name: Not on file  . Number of children: Not on file  . Years of education: Not on file  . Highest education level: Not on file  Occupational History  . Not on file  Social Needs  . Financial resource strain: Not on file  . Food insecurity:    Worry: Not on file    Inability: Not on file  .  Transportation needs:    Medical: Not on file    Non-medical: Not on file  Tobacco Use  . Smoking status: Current Every Day Smoker    Packs/day: 1.00    Years: 25.00    Pack years: 25.00    Types: Cigarettes  . Smokeless tobacco: Never Used  Substance and Sexual Activity  . Alcohol use: Yes    Comment: occasional  . Drug use: No  . Sexual activity: Not on file  Lifestyle  . Physical activity:    Days per week: Not on file    Minutes per session: Not on file  . Stress: Not on file  Relationships  . Social connections:    Talks on phone: Not on file    Gets together: Not on file    Attends religious service: Not on file    Active member of club or organization: Not on file    Attends meetings of clubs or organizations: Not on file    Relationship status: Not on file  Other Topics Concern  . Not on file  Social History Narrative  . Not on file   History reviewed. No pertinent family history. Allergies  Allergen Reactions  . Aspirin Anaphylaxis  . Ibuprofen Anaphylaxis   Prior to Admission medications   Medication Sig Start Date End Date Taking? Authorizing Provider  amLODipine (NORVASC) 10 MG tablet Take 10 mg  by mouth daily.  10/11/16  Yes [provider]  budesonide-formoterol (SYMBICORT) 160-4.5 MCG/ACT inhaler Inhale 1 puff into the lungs 2 (two) times daily.   Yes [provider]  cetirizine (ZYRTEC) 10 MG tablet Take 10 mg by mouth every morning.   Yes [provider]  FLUoxetine (PROZAC) 40 MG capsule Take 40 mg by mouth every morning.  11/05/12  Yes [provider]  hydrochlorothiazide (MICROZIDE) 12.5 MG capsule Take 12.5 mg by mouth daily. 10/11/16  Yes [provider]  HYDROcodone-acetaminophen (NORCO) 7.5-325 MG tablet Take 1 tablet by mouth every 6 (six) hours as needed for moderate pain.   Yes [provider]  montelukast (SINGULAIR) 10 MG tablet Take 10 mg by mouth every morning.  11/21/12  Yes [provider]  potassium chloride SA (K-DUR,KLOR-CON) 20 MEQ tablet Take 20 mEq by mouth daily.   Yes [provider]  PROAIR HFA 108 (90 BASE) MCG/ACT inhaler Inhale 1-2 puffs into the lungs every 6 (six) hours as needed for wheezing or shortness of breath.  11/21/12  Yes [provider]     All other systems have been reviewed and were otherwise negative with the exception of those mentioned in the HPI and as above.  Physical Exam: Vitals:   08/29/17 0714  BP: (!) 150/82  Pulse: 95  Resp: 20  Temp: 98 F (36.7 C)  SpO2: 97%    There is no height or weight on file to calculate BMI.  General: Alert, no acute distress Cardiovascular: No pedal edema Respiratory: No cyanosis, no use of accessory musculature Skin: No lesions in the area of chief complaint Neurologic: Sensation intact distally Psychiatric: Patient is competent for consent with normal mood and affect Lymphatic: No axillary or cervical lymphadenopathy  MUSCULOSKELETAL: + SLR on the left  Assessment/Plan: LEFT LEG PAIN Plan for Procedure(s): LUMBAR 4-5 LATERAL INTERBODY FUSION WITH INSTRUMENTATION AND ALLOGRAFT   Suzanne Jackson,Suzanne Rosales LEONARD, MD 08/29/2017 8:06 AM

## 2017-08-29 NOTE — Anesthesia Procedure Notes (Signed)
Procedure Name: Intubation Date/Time: 08/29/2017 8:55 AM Performed by: Fransisca KaufmannMeyer, Jhovany Weidinger E, CRNA Pre-anesthesia Checklist: Patient identified, Emergency Drugs available, Suction available and Patient being monitored Patient Re-evaluated:Patient Re-evaluated prior to induction Oxygen Delivery Method: Circle System Utilized Preoxygenation: Pre-oxygenation with 100% oxygen Induction Type: IV induction Ventilation: Mask ventilation without difficulty Laryngoscope Size: Miller and 2 Grade View: Grade I Tube type: Oral Tube size: 7.0 mm Number of attempts: 1 Airway Equipment and Method: Stylet and Oral airway Placement Confirmation: ETT inserted through vocal cords under direct vision,  positive ETCO2 and breath sounds checked- equal and bilateral Secured at: 22 cm Tube secured with: Tape Dental Injury: Teeth and Oropharynx as per pre-operative assessment

## 2017-08-29 NOTE — Op Note (Signed)
NAME: Suzanne Jackson  MEDICAL RECORD NO: [id number]  DATE OF BIRTH: 02-02-1970  FACILITY: [facility]  PHYSICIAN:Jesselle Laflamme Noreene LarssonL. Gitty Osterlund, MD  OPERATIVE REPORT  DATE OF PROCEDURE:  08/29/2017  PREOPERATIVE DIAGNOSES: 1.  Left-sided L4 radiculopathy. 2.  L4-5 spinal stenosis. 3.  Status post previous L5/S1 fusion approximately 9 years ago. 4.  L4-5 spondylolisthesis  POSTOPERATIVE DIAGNOSES: 1.  Left-sided L4 radiculopathy. 2.  L4-5 spinal stenosis. 3.  Status post previous L5/S1 fusion approximately 9 years ago.  4.  L4-5 spondylolisthesis  PROCEDURE:  (Stage 1 of 2): 1.  Left-sided lateral interbody fusion via direct lateral retroperitoneal approach. 2.  Insertion of interbody device x1 (12mm x 18 mm x 50 mm NuVasive intervertebral spacer). 3.  Placement of anterior instrumentation, L4-5. 4.  Use of morselized allograft -- ViviGen.   5.  Intraoperative use of fluoroscopy.  SURGEON:  Estill BambergMark Kalijah Zeiss, MD  ASSISTANT:  Jason CoopKayla McKenzie, PA-C.  ANESTHESIA:  General endotracheal anesthesia.  COMPLICATIONS:  None.  DISPOSITION:  Stable.  ESTIMATED BLOOD LOSS:  Minimal.  INDICATIONS:  Briefly, [name] is a very pleasant 47 year old female who did present to me with ongoing pain in the left leg.  She did have significant degenerative changes and stenosis, resulting in compression of the left L4 nerve, which I did feel was correlating to her symptoms.  Of note, a spondylolisthesis was noted at this level.  This is the level above her previous L5-S1 fusion. Given the patient's ongoing symptoms, and lack of improvement with appropriate conservative treatment measures, I did discuss proceeding with a 2-stage procedure, starting with a direct lateral interbody fusion as reflected above.  The patient was fully aware of the risks and limitations of surgery, and did wish to proceed.  DESCRIPTION OF PROCEDURE:  On 08/29/2017 the patient was brought to surgery and general endotracheal anesthesia was  administered.  The patient was placed in the lateral decubitus position, with the left side up.  Neurologic monitoring leads were placed by the monitoring technician.  The patient's torso and lower extremities were secured to the bed.  The patient's hips and knees were flexed in order to lessen the tension on the psoas musculature.  The left flank was then prepped and draped in the usual sterile fashion.  The bed was slightly flexed, in order to optimize exposure to the L4-5 intervertebral space.  After a timeout procedure was performed, a left-sided transverse incision was made over the left flank overlying the L4-5 intervertebral space.  The  superior aspect of the iliac crest was in the region of the L4-5 intervertebral space.  I did dissect just above the iliac crest, and through the external and internal oblique musculature, and transversalis musculature and fascia.  The  retroperitoneal space was encountered.  The peritoneum was bluntly swept anteriorly, and the psoas was readily identified.  I did use a series of dilators to dock over the L4-5 intervertebral space.  I did use neurologic monitoring while placing the dilators, in order to ensure that there were no neurologic structures in the immediate vicinity of the dilators.  The lumbar plexus was noted to be posterior.  A self-retaining retractor was placed, and was attached to a rigid arm.  The retractor was very gently dilated and a shim was placed into the L4-5 intervertebral space.  I then used a knife to perform an annulotomy at the lateral aspect of the L4-5 intervertebral space.  I then used a series of curettes and pituitary rongeurs in order to perform a  thorough and complete L4-5 intervertebral diskectomy.  The contralateral annulus was released.  I then placed a series of intervertebral spacer trials, and I did feel that a 12 x 18 mm x 50 mm spacer would be the most appropriate fit. The appropriate spacer was then packed with ViviGen and tamped  into position.  I was very pleased with the final resting position of the intervertebral spacer.  At this point, a 12 mm plate was placed over the lateral aspect of the L4 and L5 vertebral bodies.  I then used an awl to prepare the trajectory of the L4 and L5 vertebral body screws.  A 45 mm screw was placed into the L4 vertebral body, and a 45 mm screw was placed into the L5 vertebral body.  The screws were then locked into the plate.  The bend in the bed was removed and the bed was flattened and the plate was then locked.  I was very pleased with the final AP and lateral fluoroscopic images and the excellent restoration of disk height identified on both AP and lateral images.  Of note, I did use neurologic monitoring throughout the entire surgery, and there was no sustained EMG activity noted throughout the surgery.  At this point, the wound was copiously irrigated.  The fascia, internal, and external oblique musculature was closed using #1 Vicryl.  The subcutaneous layer was closed using 2-0 Vicryl and the skin was closed using 4-0 Monocryl.  Benzoin and Steri-Strips were applied followed by sterile dressing.  All instrument counts were correct at the termination of the procedure.  Of note, Jason CoopKayla McKenzie, PA-C was my assistant throughout surgery, and did aid in retraction, suctioning, and closure.

## 2017-08-30 ENCOUNTER — Inpatient Hospital Stay (HOSPITAL_COMMUNITY): Payer: BLUE CROSS/BLUE SHIELD | Admitting: Anesthesiology

## 2017-08-30 ENCOUNTER — Inpatient Hospital Stay (HOSPITAL_COMMUNITY)
Admission: RE | Admit: 2017-08-30 | Payer: BLUE CROSS/BLUE SHIELD | Source: Ambulatory Visit | Admitting: Orthopedic Surgery

## 2017-08-30 ENCOUNTER — Inpatient Hospital Stay (HOSPITAL_COMMUNITY): Payer: BLUE CROSS/BLUE SHIELD

## 2017-08-30 ENCOUNTER — Encounter (HOSPITAL_COMMUNITY)
Admission: RE | Disposition: A | Discharge status: Home / Self Care | Payer: Self-pay | Source: Ambulatory Visit | Attending: Orthopedic Surgery

## 2017-08-30 ENCOUNTER — Encounter (HOSPITAL_COMMUNITY): Payer: Self-pay | Admitting: Anesthesiology

## 2017-08-30 SURGERY — POSTERIOR LUMBAR FUSION 1 LEVEL
Anesthesia: General | Site: Back

## 2017-08-30 MED ORDER — LIDOCAINE 2% (20 MG/ML) 5 ML SYRINGE
INTRAMUSCULAR | Status: DC | PRN
  Administered 2017-08-30: 50 mg via INTRAVENOUS

## 2017-08-30 MED ORDER — MEPERIDINE HCL 50 MG/ML IJ SOLN: 6.2500 mg | INTRAMUSCULAR | Status: DC | PRN

## 2017-08-30 MED ORDER — CEFAZOLIN SODIUM 1 G IJ SOLR
INTRAMUSCULAR | Status: AC
  Filled 2017-08-30: qty 20

## 2017-08-30 MED ORDER — OXYCODONE-ACETAMINOPHEN 5-325 MG PO TABS
1.0000 | ORAL_TABLET | ORAL | Status: DC | PRN
  Administered 2017-08-30: 1 via ORAL
  Administered 2017-08-31: 2 via ORAL
  Administered 2017-08-31: 1 via ORAL
  Filled 2017-08-30: qty 1
  Filled 2017-08-30 (×2): qty 2

## 2017-08-30 MED ORDER — DEXAMETHASONE SODIUM PHOSPHATE 10 MG/ML IJ SOLN
INTRAMUSCULAR | Status: DC | PRN
  Administered 2017-08-30: 10 mg via INTRAVENOUS

## 2017-08-30 MED ORDER — PROMETHAZINE HCL 25 MG/ML IJ SOLN: 6.2500 mg | INTRAMUSCULAR | Status: DC | PRN

## 2017-08-30 MED ORDER — SUGAMMADEX SODIUM 200 MG/2ML IV SOLN: INTRAVENOUS | Status: DC | PRN

## 2017-08-30 MED ORDER — PROPOFOL 10 MG/ML IV BOLUS
INTRAVENOUS | Status: AC
  Filled 2017-08-30: qty 20

## 2017-08-30 MED ORDER — BUPIVACAINE-EPINEPHRINE 0.25% -1:200000 IJ SOLN
INTRAMUSCULAR | Status: DC | PRN
  Administered 2017-08-30: 27 mL

## 2017-08-30 MED ORDER — THROMBIN 20000 UNITS EX SOLR
CUTANEOUS | Status: DC | PRN
  Administered 2017-08-30: 20000 [IU] via TOPICAL

## 2017-08-30 MED ORDER — DEXMEDETOMIDINE HCL 200 MCG/2ML IV SOLN
INTRAVENOUS | Status: DC | PRN
  Administered 2017-08-30 (×3): 4 ug via INTRAVENOUS

## 2017-08-30 MED ORDER — TRAMADOL HCL 50 MG PO TABS
50.0000 mg | ORAL_TABLET | Freq: Four times a day (QID) | ORAL | Status: DC | PRN
  Administered 2017-08-30: 100 mg via ORAL
  Filled 2017-08-30: qty 2

## 2017-08-30 MED ORDER — HYDROXYZINE HCL 25 MG PO TABS
50.0000 mg | ORAL_TABLET | Freq: Four times a day (QID) | ORAL | Status: DC | PRN
  Administered 2017-08-30 – 2017-08-31 (×4): 50 mg via ORAL
  Filled 2017-08-30 (×4): qty 2

## 2017-08-30 MED ORDER — DIPHENHYDRAMINE HCL 50 MG/ML IJ SOLN
50.0000 mg | Freq: Once | INTRAMUSCULAR | Status: AC
  Administered 2017-08-30: 50 mg via INTRAVENOUS
  Filled 2017-08-30: qty 1

## 2017-08-30 MED ORDER — 0.9 % SODIUM CHLORIDE (POUR BTL) OPTIME
TOPICAL | Status: DC | PRN
  Administered 2017-08-30: 1000 mL

## 2017-08-30 MED ORDER — HYDROMORPHONE HCL 1 MG/ML IJ SOLN
0.5000 mg | INTRAMUSCULAR | Status: DC | PRN
  Administered 2017-08-30: 0.5 mg via INTRAVENOUS
  Filled 2017-08-30: qty 1

## 2017-08-30 MED ORDER — MIDAZOLAM HCL 2 MG/2ML IJ SOLN
INTRAMUSCULAR | Status: AC
  Filled 2017-08-30: qty 2

## 2017-08-30 MED ORDER — METHYLENE BLUE 0.5 % INJ SOLN
INTRAVENOUS | Status: DC | PRN
  Administered 2017-08-30: .2 mL via INTRADERMAL

## 2017-08-30 MED ORDER — PROPOFOL 500 MG/50ML IV EMUL
INTRAVENOUS | Status: DC | PRN
  Administered 2017-08-30: 50 ug/kg/min via INTRAVENOUS

## 2017-08-30 MED ORDER — LACTATED RINGERS IV SOLN
INTRAVENOUS | Status: DC | PRN
  Administered 2017-08-30 (×2): via INTRAVENOUS

## 2017-08-30 MED ORDER — PHENYLEPHRINE HCL 10 MG/ML IJ SOLN
INTRAMUSCULAR | Status: DC | PRN
  Administered 2017-08-30: 40 ug via INTRAVENOUS

## 2017-08-30 MED ORDER — CEFAZOLIN SODIUM-DEXTROSE 2-3 GM-%(50ML) IV SOLR
INTRAVENOUS | Status: DC | PRN
  Administered 2017-08-30: 2 g via INTRAVENOUS

## 2017-08-30 MED ORDER — SODIUM CHLORIDE 0.9 % IV SOLN
INTRAVENOUS | Status: DC | PRN
  Administered 2017-08-30: 25 ug/min via INTRAVENOUS

## 2017-08-30 MED ORDER — SUCCINYLCHOLINE CHLORIDE 20 MG/ML IJ SOLN
INTRAMUSCULAR | Status: DC | PRN
  Administered 2017-08-30: 100 mg via INTRAVENOUS

## 2017-08-30 MED ORDER — BUPIVACAINE LIPOSOME 1.3 % IJ SUSP
20.0000 mL | INTRAMUSCULAR | Status: AC
  Administered 2017-08-30: 20 mL
  Filled 2017-08-30: qty 20

## 2017-08-30 MED ORDER — ONDANSETRON HCL 4 MG/2ML IJ SOLN
INTRAMUSCULAR | Status: DC | PRN
  Administered 2017-08-30: 4 mg via INTRAVENOUS

## 2017-08-30 MED ORDER — FENTANYL CITRATE (PF) 250 MCG/5ML IJ SOLN
INTRAMUSCULAR | Status: AC
  Filled 2017-08-30: qty 5

## 2017-08-30 MED ORDER — BUPIVACAINE-EPINEPHRINE (PF) 0.25% -1:200000 IJ SOLN
INTRAMUSCULAR | Status: AC
  Filled 2017-08-30: qty 30

## 2017-08-30 MED ORDER — HYDROMORPHONE HCL 1 MG/ML IJ SOLN
0.2500 mg | INTRAMUSCULAR | Status: DC | PRN
  Administered 2017-08-30 (×2): 0.5 mg via INTRAVENOUS

## 2017-08-30 MED ORDER — SUGAMMADEX SODIUM 200 MG/2ML IV SOLN
INTRAVENOUS | Status: DC | PRN
  Administered 2017-08-30: 100 mg via INTRAVENOUS

## 2017-08-30 MED ORDER — THROMBIN (RECOMBINANT) 20000 UNITS EX SOLR
CUTANEOUS | Status: AC
  Filled 2017-08-30: qty 20000

## 2017-08-30 MED ORDER — FENTANYL CITRATE (PF) 100 MCG/2ML IJ SOLN
INTRAMUSCULAR | Status: DC | PRN
  Administered 2017-08-30: 100 ug via INTRAVENOUS
  Administered 2017-08-30 (×3): 50 ug via INTRAVENOUS

## 2017-08-30 MED ORDER — PROPOFOL 10 MG/ML IV BOLUS
INTRAVENOUS | Status: DC | PRN
Start: 2017-08-30 — End: 2017-08-30
  Administered 2017-08-30: 30 mg via INTRAVENOUS
  Administered 2017-08-30: 120 mg via INTRAVENOUS

## 2017-08-30 MED ORDER — LACTATED RINGERS IV SOLN
INTRAVENOUS | Status: DC
  Administered 2017-08-30: 11:00:00 via INTRAVENOUS

## 2017-08-30 MED ORDER — HYDROCODONE-ACETAMINOPHEN 10-325 MG PO TABS
1.0000 | ORAL_TABLET | ORAL | Status: DC | PRN
  Administered 2017-08-30: 2 via ORAL
  Filled 2017-08-30: qty 2
  Filled 2017-08-30: qty 1

## 2017-08-30 MED ORDER — METHYLENE BLUE 0.5 % INJ SOLN
INTRAVENOUS | Status: AC
  Filled 2017-08-30: qty 10

## 2017-08-30 MED ORDER — ROCURONIUM BROMIDE 50 MG/5ML IV SOSY
PREFILLED_SYRINGE | INTRAVENOUS | Status: DC | PRN
  Administered 2017-08-30: 40 mg via INTRAVENOUS

## 2017-08-30 MED FILL — Thrombin (Recombinant) For Soln 20000 Unit: CUTANEOUS | Qty: 1 | Status: AC

## 2017-08-30 SURGICAL SUPPLY — 87 items
APL SKNCLS STERI-STRIP NONHPOA (GAUZE/BANDAGES/DRESSINGS) ×1
BENZOIN TINCTURE PRP APPL 2/3 (GAUZE/BANDAGES/DRESSINGS) ×2 IMPLANT
BLADE CLIPPER SURG (BLADE) IMPLANT
BONE VIVIGEN FORMABLE 1.3CC (Bone Implant) ×2 IMPLANT
BUR CUTTER 5.0 RND AGGRESSIVE (BURR) ×1 IMPLANT
BUR PRESCISION 1.7 ELITE (BURR) ×2 IMPLANT
BUR ROUND FLUTED 5 RND (BURR) ×2 IMPLANT
BUR ROUND PRECISION 4.0 (BURR) IMPLANT
BUR SABER RD CUTTING 3.0 (BURR) IMPLANT
CARTRIDGE OIL MAESTRO DRILL (MISCELLANEOUS) ×1 IMPLANT
CLSR STERI-STRIP ANTIMIC 1/2X4 (GAUZE/BANDAGES/DRESSINGS) ×1 IMPLANT
CONT SPEC 4OZ CLIKSEAL STRL BL (MISCELLANEOUS) ×2 IMPLANT
COVER MAYO STAND STRL (DRAPES) ×4 IMPLANT
COVER SURGICAL LIGHT HANDLE (MISCELLANEOUS) ×2 IMPLANT
DIFFUSER DRILL AIR PNEUMATIC (MISCELLANEOUS) ×2 IMPLANT
DRAIN CHANNEL 15F RND FF W/TCR (WOUND CARE) IMPLANT
DRAPE C-ARM 42X72 X-RAY (DRAPES) ×2 IMPLANT
DRAPE C-ARMOR (DRAPES) IMPLANT
DRAPE POUCH INSTRU U-SHP 10X18 (DRAPES) ×2 IMPLANT
DRAPE SURG 17X23 STRL (DRAPES) ×8 IMPLANT
DURAPREP 26ML APPLICATOR (WOUND CARE) ×2 IMPLANT
ELECT BLADE 4.0 EZ CLEAN MEGAD (MISCELLANEOUS) ×2
ELECT CAUTERY BLADE 6.4 (BLADE) ×2 IMPLANT
ELECT REM PT RETURN 9FT ADLT (ELECTROSURGICAL) ×2
ELECTRODE BLDE 4.0 EZ CLN MEGD (MISCELLANEOUS) ×1 IMPLANT
ELECTRODE REM PT RTRN 9FT ADLT (ELECTROSURGICAL) ×1 IMPLANT
EVACUATOR SILICONE 100CC (DRAIN) IMPLANT
FEE INTRAOP MONITOR IMPULS NCS (MISCELLANEOUS) IMPLANT
FILTER STRAW FLUID ASPIR (MISCELLANEOUS) ×2 IMPLANT
GAUZE 4X4 16PLY RFD (DISPOSABLE) ×2 IMPLANT
GAUZE SPONGE 4X4 12PLY STRL (GAUZE/BANDAGES/DRESSINGS) ×2 IMPLANT
GLOVE BIO SURGEON STRL SZ7 (GLOVE) ×2 IMPLANT
GLOVE BIO SURGEON STRL SZ8 (GLOVE) ×2 IMPLANT
GLOVE BIOGEL PI IND STRL 7.0 (GLOVE) ×1 IMPLANT
GLOVE BIOGEL PI IND STRL 8 (GLOVE) ×1 IMPLANT
GLOVE BIOGEL PI INDICATOR 7.0 (GLOVE) ×1
GLOVE BIOGEL PI INDICATOR 8 (GLOVE) ×1
GOWN STRL REUS W/ TWL LRG LVL3 (GOWN DISPOSABLE) ×2 IMPLANT
GOWN STRL REUS W/ TWL XL LVL3 (GOWN DISPOSABLE) ×1 IMPLANT
GOWN STRL REUS W/TWL LRG LVL3 (GOWN DISPOSABLE) ×4
GOWN STRL REUS W/TWL XL LVL3 (GOWN DISPOSABLE) ×2
GRAFT BNE MATRIX VG FRMBL SM 1 (Bone Implant) IMPLANT
INTRAOP MONITOR FEE IMPULS NCS (MISCELLANEOUS) ×1
INTRAOP MONITOR FEE IMPULSE (MISCELLANEOUS) ×1
IV CATH 14GX2 1/4 (CATHETERS) ×2 IMPLANT
KIT BASIN OR (CUSTOM PROCEDURE TRAY) ×2 IMPLANT
KIT POSITION SURG JACKSON T1 (MISCELLANEOUS) ×2 IMPLANT
KIT TURNOVER KIT B (KITS) ×2 IMPLANT
MARKER SKIN DUAL TIP RULER LAB (MISCELLANEOUS) ×4 IMPLANT
NDL HYPO 25GX1X1/2 BEV (NEEDLE) ×1 IMPLANT
NDL SAFETY ECLIPSE 18X1.5 (NEEDLE) ×1 IMPLANT
NDL SPNL 18GX3.5 QUINCKE PK (NEEDLE) ×2 IMPLANT
NEEDLE 22X1 1/2 (OR ONLY) (NEEDLE) ×4 IMPLANT
NEEDLE HYPO 18GX1.5 SHARP (NEEDLE) ×2
NEEDLE HYPO 25GX1X1/2 BEV (NEEDLE) ×2 IMPLANT
NEEDLE SPNL 18GX3.5 QUINCKE PK (NEEDLE) ×4 IMPLANT
NS IRRIG 1000ML POUR BTL (IV SOLUTION) ×2 IMPLANT
OIL CARTRIDGE MAESTRO DRILL (MISCELLANEOUS) ×2
PACK LAMINECTOMY ORTHO (CUSTOM PROCEDURE TRAY) ×2 IMPLANT
PACK UNIVERSAL I (CUSTOM PROCEDURE TRAY) ×2 IMPLANT
PAD ARMBOARD 7.5X6 YLW CONV (MISCELLANEOUS) ×4 IMPLANT
PATTIES SURGICAL .5 X1 (DISPOSABLE) ×2 IMPLANT
PATTIES SURGICAL .5X1.5 (GAUZE/BANDAGES/DRESSINGS) ×2 IMPLANT
PROBE PEDCLE PROBE MAGSTM DISP (MISCELLANEOUS) ×1 IMPLANT
ROD PRE BENT EXP 40MM (Rod) ×1 IMPLANT
ROD PRE BENT EXPEDIUM 35MM (Rod) ×1 IMPLANT
SCREW CORTICAL VIPER 7X40MM (Screw) ×2 IMPLANT
SCREW SET SINGLE INNER (Screw) ×4 IMPLANT
SCREW VIPER CORTICAL FIX 6X40 (Screw) ×2 IMPLANT
SPONGE INTESTINAL PEANUT (DISPOSABLE) ×2 IMPLANT
SPONGE SURGIFOAM ABS GEL 100 (HEMOSTASIS) ×2 IMPLANT
STRIP CLOSURE SKIN 1/2X4 (GAUZE/BANDAGES/DRESSINGS) ×4 IMPLANT
SURGIFLO W/THROMBIN 8M KIT (HEMOSTASIS) IMPLANT
SUT MNCRL AB 4-0 PS2 18 (SUTURE) ×2 IMPLANT
SUT VIC AB 0 CT1 18XCR BRD 8 (SUTURE) ×1 IMPLANT
SUT VIC AB 0 CT1 8-18 (SUTURE) ×2
SUT VIC AB 1 CT1 18XCR BRD 8 (SUTURE) ×1 IMPLANT
SUT VIC AB 1 CT1 8-18 (SUTURE) ×2
SUT VIC AB 2-0 CT2 18 VCP726D (SUTURE) ×3 IMPLANT
SYR 20CC LL (SYRINGE) ×4 IMPLANT
SYR BULB IRRIGATION 50ML (SYRINGE) ×2 IMPLANT
SYR CONTROL 10ML LL (SYRINGE) ×4 IMPLANT
SYR TB 1ML LUER SLIP (SYRINGE) ×2 IMPLANT
TAPE CLOTH SURG 6X10 WHT LF (GAUZE/BANDAGES/DRESSINGS) ×1 IMPLANT
TRAY FOLEY MTR SLVR 16FR STAT (SET/KITS/TRAYS/PACK) ×2 IMPLANT
WATER STERILE IRR 1000ML POUR (IV SOLUTION) ×2 IMPLANT
YANKAUER SUCT BULB TIP NO VENT (SUCTIONS) ×2 IMPLANT

## 2017-08-30 NOTE — Progress Notes (Signed)
    Of note the pt had extreme difficulty with itching overnight. She was unable to tolerate morphine, dilaudid, or percocet. She reports having tolerated norco in the past at low doses. We will attempt Norco 10-325 PO for px control today sparingly, OK to use tramadol and/or tylenol primarily and only dose Norco as needed to minimize codeine exposure. Benadryl and visteril PRN.   Jason CoopKayla Kassady Laboy, PA-C  [facility]   [phone]

## 2017-08-30 NOTE — Op Note (Signed)
NAME: Suzanne Jackson  MEDICAL RECORD NO: [id number]  DATE OF BIRTH: 1970/04/21  FACILITY: [facility]  PHYSICIAN:Suzanne Seymour Noreene LarssonL. Shemicka Cohrs, MD  OPERATIVE REPORT  DATE OF PROCEDURE:  08/30/2017     PREOPERATIVE DIAGNOSES: 1.  Left-sided L4 radiculopathy. 2.  L4-5 spinal stenosis. 3.  Status post previous L5/S1 fusion approximately 9 years ago. 4.  L4-5 spondylolisthesis 5.  S/p L4/5 lateral fusion, requiring posterior fixation and fusion  POSTOPERATIVE DIAGNOSES: 1.  Left-sided L4 radiculopathy. 2.  L4-5 spinal stenosis. 3.  Status post previous L5/S1 fusion approximately 9 years ago.  4.  L4-5 spondylolisthesis 5.  S/p L4/5 lateral fusion, requiring posterior fixation and fusion   PROCEDURE (stage 2 of 2): 1. L4/5 posterior spinal fusion 2. Placement of new instrumentation L4, L5, including upsizing of L5 screws 3. Removal of previously placed bilateral S1 instrumentation 4. Use of morselized allograft - Vivigen 5. Intraoperative use of fluoroscopy   SURGEON:  Suzanne BambergMark Subrina Vecchiarelli, MD.   ASSISTANJason Jackson:  Suzanne McKenzie, PA-C.   ANESTHESIA:  General endotracheal anesthesia.   COMPLICATIONS:  None.   DISPOSITION:  Stable.   ESTIMATED BLOOD LOSS:  100cc   INDICATIONS FOR SURGERY:  Briefly, Ms Suzanne Jackson is a very pleasant 47 year old female who did present to me with ongoing pain in the left leg.  She did undergo a lateral fusion procedure at L4-5 on 08/29/2017.  Please refer to that operative report for additional details regarding that procedure.  The patient did present today for stage 2, specifically, a posterior spinal fusion with instrumentation and allograft.   OPERATIVE DETAILS:  On  08/30/2017, the patient was brought to surgery and general endotracheal anesthesia was administered.  The patient was placed prone on a well-padded flat Jackson bed with a Wilson frame.  Antibiotics were given and a time-out procedure was performed. The back was prepped and draped in the usual fashion.     The patient's previously noted posterior incision was reutilized. The fascia was incised at the midline.  The paraspinal musculature was bluntly swept laterally. The patient's previously placed L5-S1 construct was identified.  The patient's instrumentation at L5 and S1 was removed.  I did use a high-speed bur to decorticate the bilateral L4-5 facet joints and posterior elements.  I did remove substantial bony hypertrophy in the region of the previously placed bilateral L5 screws.  Next, I placed a 7 x 40 mm screws bilaterally at L5.  New screws were placed at L4 bilaterally, using intraoperative fluoroscopy and anatomic landmarks.  These were 6 x 40 mm screws.  A medial to lateral cortical trajectory technique was utilized for the L4 screws bilaterally.  Interconnecting rods were placed bilaterally across the L4-5 level bilaterally.  Caps were placed and a final locking procedure was performed overall for screws.  I then placed allograft in the form of Vivigen into the region of the bilateral L4-5 facet joints and posterior lateral gutters.  Prior to this, the wound was copiously irrigated with 2 L of normal saline.  The wound was explored for any undue bleeding and there was no substantial bleeding encountered. The wound was then closed in layers using #1 Vicryl followed by 2-0 Vicryl, followed by 4-0 Monocryl.  Benzoin and Steri-Strips were applied followed by sterile dressing.    Of note, did use triggered EMG to test the screws on the right, and there is no screw the tested below 20 mA. There was no sustained abnormal EMG activity noted throughout the surgery.   Of note, Suzanne CoopKayla Jackson was  my assistant throughout surgery, and did aid in retraction, suctioning, and closure.     Suzanne BambergMark Atreyu Mak, MD

## 2017-08-30 NOTE — Transfer of Care (Signed)
Immediate Anesthesia Transfer of Care Note  Patient: Suzanne Jackson  Procedure(s) Performed: LUMBAR 4-5 POSTERIOR SPINAL FUSION WITH INSTRUMENTATION AND ALLOGRAFT (N/A Back)  Patient Location: PACU  Anesthesia Type:General  Level of Consciousness: awake, alert  and oriented  Airway & Oxygen Therapy: Patient Spontanous Breathing and Patient connected to nasal cannula oxygen  Post-op Assessment: Report given to RN, Post -op Vital signs reviewed and stable and Patient moving all extremities X 4  Post vital signs: Reviewed and stable  Last Vitals:  Vitals Value Taken Time  BP 154/81 08/30/2017 10:57 AM  Temp    Pulse 107 08/30/2017 10:59 AM  Resp 18 08/30/2017 10:59 AM  SpO2 97 % 08/30/2017 10:59 AM  Vitals shown include unvalidated device data.  Last Pain:  Vitals:   08/30/17 0346  TempSrc:   PainSc: Asleep      Patients Stated Pain Goal: 3 (08/29/17 0705)  Complications: No apparent anesthesia complications

## 2017-08-30 NOTE — Progress Notes (Signed)
Patient continuing with severe itching after admin of Vistaril, see emar. Removed all tape and gave patient CHG. Linens and gown changed. Patient said she just wants to sleep. Gave Ambien., see emar.

## 2017-08-30 NOTE — Evaluation (Signed)
Physical Therapy Evaluation Patient Details Name: Suzanne Jackson D Heninger MRN: [id number] DOB: 07/05/1970 Today's Date: 08/30/2017   History of Present Illness  Pt is a 47 y.o. F with significant PMH of previous L5/S1 fusion 9 years ago who presents s/p lateral fusion L4-5 (8/14) and L4/5 posterior spinal fusion (8/15).  Clinical Impression  Patient is s/p above surgery resulting in the deficits listed below (see PT Problem List). Patient is independent at baseline. Currently ambulating with walker and min guard assist 160 feet with guarded posture and slow gait speed. Recommending walker for pain control. Will need to participate in stair training prior to discharge. Patient will benefit from skilled PT to increase their independence and safety with mobility (while adhering to their precautions) to allow discharge to the venue listed below.     Follow Up Recommendations No PT follow up;Supervision for mobility/OOB    Equipment Recommendations  Rolling walker with 5" wheels;3in1 (PT)    Recommendations for Other Services       Precautions / Restrictions Precautions Precautions: Fall;Back Precaution Booklet Issued: Yes (comment) Precaution Comments: verbally reviewed precautions and provided handout Required Braces or Orthoses: Spinal Brace Spinal Brace: Thoracolumbosacral orthotic Restrictions Weight Bearing Restrictions: No      Mobility  Bed Mobility               General bed mobility comments: OOB in bathroom with nurse upon entry  Transfers Overall transfer level: Needs assistance Equipment used: Rolling walker (2 wheeled) Transfers: Sit to/from Stand Sit to Stand: Supervision         General transfer comment: cues for hand placement during transition of stand to sitting and for eccentric control  Ambulation/Gait Ambulation/Gait assistance: Min guard Gait Distance (Feet): 160 Feet Assistive device: Rolling walker (2 wheeled) Gait Pattern/deviations: Step-through  pattern;Decreased stride length;Trunk flexed Gait velocity: decr   General Gait Details: Patient with very guarded and slow gait. Cues for scapular depression, walker proximity, and looking up.  Stairs            Wheelchair Mobility    Modified Rankin (Stroke Patients Only)       Balance Overall balance assessment: Needs assistance Sitting-balance support: No upper extremity supported;Feet supported Sitting balance-Leahy Scale: Good     Standing balance support: No upper extremity supported;During functional activity Standing balance-Leahy Scale: Fair                               Pertinent Vitals/Pain Pain Assessment: Faces Faces Pain Scale: Hurts whole lot Pain Location: lower back and left hip Pain Descriptors / Indicators: Guarding;Operative site guarding;Grimacing Pain Intervention(s): Limited activity within patient's tolerance;Monitored during session    Home Living Family/patient expects to be discharged to:: Private residence Living Arrangements: Spouse/significant other Available Help at Discharge: Family Type of Home: House Home Access: Stairs to enter Entrance Stairs-Rails: None Secretary/administratorntrance Stairs-Number of Steps: 2 Home Layout: One level Home Equipment: None      Prior Function Level of Independence: Independent               Hand Dominance        Extremity/Trunk Assessment   Upper Extremity Assessment Upper Extremity Assessment: Overall WFL for tasks assessed    Lower Extremity Assessment Lower Extremity Assessment: RLE deficits/detail;LLE deficits/detail RLE Deficits / Details: At least anti gravity but unable to formally assess due to pain LLE Deficits / Details: At least anti gravity but unable to formally assess due  to pain    Cervical / Trunk Assessment Cervical / Trunk Assessment: Other exceptions Cervical / Trunk Exceptions: s/p L4-5 lateral and posterior spinal fusion. Guarded posture with bilateral shoulder  elevation  Communication   Communication: No difficulties  Cognition Arousal/Alertness: Awake/alert Behavior During Therapy: WFL for tasks assessed/performed Overall Cognitive Status: Within Functional Limits for tasks assessed                                        General Comments General comments (skin integrity, edema, etc.): pt husband present    Exercises     Assessment/Plan    PT Assessment Patient needs continued PT services  PT Problem List Decreased strength;Decreased activity tolerance;Decreased balance;Decreased mobility;Decreased coordination;Pain       PT Treatment Interventions DME instruction;Gait training;Stair training;Functional mobility training;Therapeutic exercise;Therapeutic activities;Balance training;Patient/family education    PT Goals (Current goals can be found in the Care Plan section)  Acute Rehab PT Goals Patient Stated Goal: "less pain" PT Goal Formulation: With patient Time For Goal Achievement: 09/01/17 Potential to Achieve Goals: Good    Frequency Min 5X/week   Barriers to discharge        Co-evaluation               AM-PAC PT "6 Clicks" Daily Activity  Outcome Measure Difficulty turning over in bed (including adjusting bedclothes, sheets and blankets)?: A Little Difficulty moving from lying on back to sitting on the side of the bed? : A Lot Difficulty sitting down on and standing up from a chair with arms (e.g., wheelchair, bedside commode, etc,.)?: A Little Help needed moving to and from a bed to chair (including a wheelchair)?: A Little Help needed walking in hospital room?: A Little Help needed climbing 3-5 steps with a railing? : A Little 6 Click Score: 17    End of Session Equipment Utilized During Treatment: Gait belt;Back brace Activity Tolerance: Patient tolerated treatment well Patient left: Other (comment);with call bell/phone within reach;with family/visitor present(seated EOB) Nurse  Communication: Mobility status PT Visit Diagnosis: Difficulty in walking, not elsewhere classified (R26.2);Pain Pain - part of body: (back)    Time: [id number] PT Time Calculation (min) (ACUTE ONLY): 21 min   Charges:   PT Evaluation $PT Eval Low Complexity: 1 Low          Suzanne Bustlearoline Brittan Butterbaugh, PT, DPT Acute Rehabilitation Services  Pager: [phone]   Vanetta MuldersCarloine H Isacc Turney 08/30/2017, 5:19 PM

## 2017-08-30 NOTE — H&P (Signed)
Patient tolerated stage 1 of her procedure well, and presents today for stage 2 (PSF at L4/5). Will proceed as planned.

## 2017-08-30 NOTE — Progress Notes (Signed)
Patient continues to experience severe itching. Notified D. Laliberte PA. Received orders for Benadryl IV X 1 dose and Tramadol, see orders.

## 2017-08-30 NOTE — Progress Notes (Signed)
Patient has severe itching and Benadryl was given at 22:00. Notified D. Laliberte PA and received orders for vistaril for itching and Percocet Discontinued .

## 2017-08-30 NOTE — Anesthesia Postprocedure Evaluation (Signed)
Anesthesia Post Note  Patient: Octaviano Battyeresa D Chatterjee  Procedure(s) Performed: LUMBAR 4-5 POSTERIOR SPINAL FUSION WITH INSTRUMENTATION AND ALLOGRAFT (N/A Back)     Patient location during evaluation: PACU Anesthesia Type: General Level of consciousness: awake and alert Pain management: pain level controlled Vital Signs Assessment: post-procedure vital signs reviewed and stable Respiratory status: spontaneous breathing, nonlabored ventilation, respiratory function stable and patient connected to nasal cannula oxygen Cardiovascular status: blood pressure returned to baseline and stable Postop Assessment: no apparent nausea or vomiting Anesthetic complications: no    Last Vitals:  Vitals:   08/30/17 1145 08/30/17 1249  BP: 126/69 132/74  Pulse: 95 97  Resp: 13 16  Temp: (!) 36.4 C 36.4 C  SpO2: 93% 92%    Last Pain:  Vitals:   08/30/17 1249  TempSrc: Oral  PainSc:                  Shelton SilvasKevin D Adaira Centola

## 2017-08-30 NOTE — Anesthesia Preprocedure Evaluation (Addendum)
Anesthesia Evaluation  Patient identified by MRN, date of birth, ID band Patient awake    Reviewed: Allergy & Precautions, NPO status , Patient's Chart, lab work & pertinent test results  Airway Mallampati: I  TM Distance: >3 FB Neck ROM: Full    Dental  (+) Teeth Intact, Dental Advisory Given   Pulmonary asthma , Current Smoker,    breath sounds clear to auscultation       Cardiovascular hypertension, Pt. on medications  Rhythm:Regular Rate:Normal     Neuro/Psych PSYCHIATRIC DISORDERS Depression  Neuromuscular disease    GI/Hepatic negative GI ROS, Neg liver ROS,   Endo/Other  negative endocrine ROS  Renal/GU negative Renal ROS     Musculoskeletal  (+) Arthritis ,   Abdominal Normal abdominal exam  (+)   Peds  Hematology negative hematology ROS (+)   Anesthesia Other Findings   Reproductive/Obstetrics                            Lab Results  Component Value Date   WBC 8.1 08/22/2017   HGB 13.8 08/22/2017   HCT 42.0 08/22/2017   MCV 104.0 (H) 08/22/2017   PLT 258 08/22/2017   Lab Results  Component Value Date   CREATININE 0.66 08/22/2017   BUN 11 08/22/2017   NA 138 08/22/2017   K 3.4 (L) 08/22/2017   CL 98 08/22/2017   CO2 26 08/22/2017   Lab Results  Component Value Date   INR 0.95 08/22/2017   INR 0.88 10/28/2008   EKG: normal sinus rhythm.  Anesthesia Physical Anesthesia Plan  ASA: II  Anesthesia Plan: General   Post-op Pain Management:    Induction: Intravenous  PONV Risk Score and Plan: 3 and Ondansetron, Dexamethasone and Midazolam  Airway Management Planned: Oral ETT  Additional Equipment: None  Intra-op Plan:   Post-operative Plan: Extubation in OR  Informed Consent: I have reviewed the patients History and Physical, chart, labs and discussed the procedure including the risks, benefits and alternatives for the proposed anesthesia with the patient or  authorized representative who has indicated his/her understanding and acceptance.   Dental advisory given and Dental Advisory Given  Plan Discussed with: CRNA  Anesthesia Plan Comments:       Anesthesia Quick Evaluation

## 2017-08-30 NOTE — Progress Notes (Signed)
PT Cancellation Note  Patient Details Name: Suzanne Jackson MRN: [id number] DOB: 08/22/1970   Cancelled Treatment:    Reason Eval/Treat Not Completed: Fatigue/lethargy limiting ability to participate. Attempted x2 and pt too lethargic to participate with PT evaluation. Will continue to follow.    Marylynn PearsonLaura D Reign Dziuba 08/30/2017, 2:16 PM   Conni SlipperLaura Lianne Carreto, PT, DPT Acute Rehabilitation Services Pager: [phone]

## 2017-08-30 NOTE — Anesthesia Procedure Notes (Signed)
Procedure Name: Intubation Date/Time: 08/30/2017 7:33 AM Performed by: Neldon Newport, CRNA Pre-anesthesia Checklist: Timeout performed, Patient being monitored, Suction available, Emergency Drugs available and Patient identified Patient Re-evaluated:Patient Re-evaluated prior to induction Oxygen Delivery Method: Circle system utilized Preoxygenation: Pre-oxygenation with 100% oxygen Induction Type: IV induction and Rapid sequence Ventilation: Mask ventilation without difficulty Laryngoscope Size: Mac and 3 Grade View: Grade I Tube type: Oral Tube size: 7.0 mm Number of attempts: 1 Placement Confirmation: breath sounds checked- equal and bilateral,  positive ETCO2 and ETT inserted through vocal cords under direct vision Secured at: 21 cm Tube secured with: Tape Dental Injury: Teeth and Oropharynx as per pre-operative assessment

## 2017-08-30 NOTE — Progress Notes (Signed)
Patient is lying quietly in the bed with eyes closed. No signs of  discomfort or itching noted. Will continue to monitor.

## 2017-08-31 MED ORDER — HYDROXYZINE HCL 50 MG PO TABS: 50.0000 mg | ORAL_TABLET | Freq: Four times a day (QID) | ORAL | 0 refills | Status: AC | PRN

## 2017-08-31 MED FILL — Heparin Sodium (Porcine) Inj 1000 Unit/ML: INTRAMUSCULAR | Qty: 30 | Status: AC

## 2017-08-31 MED FILL — Thrombin (Recombinant) For Soln 20000 Unit: CUTANEOUS | Qty: 1 | Status: AC

## 2017-08-31 MED FILL — Sodium Chloride IV Soln 0.9%: INTRAVENOUS | Qty: 1000 | Status: AC

## 2017-08-31 NOTE — Progress Notes (Signed)
    Patient doing well postop day 1 and 2 status post staged L4-L5 lateral/posterior fusion with removal of previous L5-S1 posterior hardware.she did have prominent pathology with itching postop day 1 but states that she is doing much better today.  She has tolerated Percocet well dosed with Vistaril to help with the itching.  She was unable to tolerate morphine or Dilaudid.  She prefers the Percocet over the hydrocodone.  She finally caught up on sleep yesterday and slept well last night.  She has been up and walking.  She has done physical therapy.  She still has to clear the stairs before going home.  She has no concerns about progressing home.   Physical Exam: Vitals:   08/31/17 0334 08/31/17 0725  BP: (!) 126/58 116/66  Pulse: 84 (!) 102  Resp: 18 16  Temp: 98.1 F (36.7 C) 98.2 F (36.8 C)  SpO2: 96% 96%    Dressing in place, it is clean dry and intact.  Patient is resting comfortably in her hospital bed.  The brace is at her bedside. NVI  POD #1/2 s/p staged L4-L5 lateral/posterior fusion with removal of previous L5-S1 posterior instrumentation.  - resolved leg pain.  Mild right hip discomfort - Expected postoperative low back pain well-controlled - Itching resolved with Vistaril and discontinuation of IV pain medications - up with PT/OT, encourage ambulation - Percocet for pain, Valium for muscle spasms - likely d/c home today with f/u in 2 weeks

## 2017-08-31 NOTE — Evaluation (Signed)
Occupational Therapy Evaluation and Discharge Patient Details Name: Suzanne Jackson MRN: [id number] DOB: 06/14/1970 Today's Date: 08/31/2017    History of Present Illness Pt is a 47 y.o. F with significant PMH of previous L5/S1 fusion 9 years ago who presents s/p lateral fusion L4-5 (8/14) and L4/5 posterior spinal fusion (8/15).   Clinical Impression   All education completed with regard to back precautions during ADL and IADL, home safety and use of DME. Pt and husband verbalizing understanding. No further OT needs.    Follow Up Recommendations  No OT follow up    Equipment Recommendations  3 in 1 bedside commode    Recommendations for Other Services       Precautions / Restrictions Precautions Precautions: Fall;Back Precaution Booklet Issued: Yes (comment) Precaution Comments: educated in back precautions related to ADL and IADL Required Braces or Orthoses: Spinal Brace Spinal Brace: Thoracolumbosacral orthotic Restrictions Weight Bearing Restrictions: No      Mobility Bed Mobility               General bed mobility comments: pt on EOB  Transfers Overall transfer level: Modified independent Equipment used: Rolling walker (2 wheeled) Transfers: Sit to/from Stand Sit to Stand: Modified independent (Device/Increase time)              Balance Overall balance assessment: Needs assistance Sitting-balance support: No upper extremity supported;Feet supported Sitting balance-Leahy Scale: Good     Standing balance support: No upper extremity supported;During functional activity Standing balance-Leahy Scale: Fair                             ADL either performed or assessed with clinical judgement   ADL Overall ADL's : Needs assistance/impaired Eating/Feeding: Independent;Sitting   Grooming: Modified independent;Standing Grooming Details (indicate cue type and reason): educated in two cup method and use of wash cloth to avoid bending at  sink Upper Body Bathing: Set up;Sitting Upper Body Bathing Details (indicate cue type and reason): recommended pt use her back brush to avoid twisting Lower Body Bathing: Sit to/from stand;Modified independent   Upper Body Dressing : Set up;Sitting Upper Body Dressing Details (indicate cue type and reason): knowledgeable in donning back brace Lower Body Dressing: Sit to/from stand;Modified independent   Toilet Transfer: Ambulation;RW;Regular Toilet;Modified Independent   Toileting- Clothing Manipulation and Hygiene: Modified independent;Sit to/from stand Toileting - Clothing Manipulation Details (indicate cue type and reason): reminded not to twist with pericare   Tub/Shower Transfer Details (indicate cue type and reason): educated in safe shower transfer, pt is aware she can sit on 3 in 1 Functional mobility during ADLs: Supervision/safety;Rolling walker General ADL Comments: educated in safe footwear, how to transport items safely with RW     Vision Patient Visual Report: No change from baseline       Perception     Praxis      Pertinent Vitals/Pain Pain Assessment: Faces Faces Pain Scale: Hurts even more Pain Location: lower back and left hip Pain Descriptors / Indicators: Guarding;Operative site guarding;Grimacing Pain Intervention(s): Monitored during session;Premedicated before session     Hand Dominance Right   Extremity/Trunk Assessment Upper Extremity Assessment Upper Extremity Assessment: Overall WFL for tasks assessed   Lower Extremity Assessment Lower Extremity Assessment: Defer to PT evaluation   Cervical / Trunk Assessment Cervical / Trunk Assessment: Other exceptions Cervical / Trunk Exceptions: s/p L4-5 lateral and posterior spinal fusion. Guarded posture with bilateral shoulder elevation   Communication Communication Communication:  No difficulties   Cognition Arousal/Alertness: Awake/alert Behavior During Therapy: WFL for tasks  assessed/performed Overall Cognitive Status: Within Functional Limits for tasks assessed                                     General Comments       Exercises     Shoulder Instructions      Home Living Family/patient expects to be discharged to:: Private residence Living Arrangements: Spouse/significant other Available Help at Discharge: Family Type of Home: House Home Access: Stairs to enter Secretary/administratorntrance Stairs-Number of Steps: 2 Entrance Stairs-Rails: None Home Layout: One level     Bathroom Shower/Tub: Producer, television/film/videoWalk-in shower   Bathroom Toilet: Standard     Home Equipment: None          Prior Functioning/Environment Level of Independence: Independent                 OT Problem List:        OT Treatment/Interventions:      OT Goals(Current goals can be found in the care plan section) Acute Rehab OT Goals Patient Stated Goal: "less pain"  OT Frequency:     Barriers to D/C:            Co-evaluation              AM-PAC PT "6 Clicks" Daily Activity     Outcome Measure Help from another person eating meals?: None Help from another person taking care of personal grooming?: None Help from another person toileting, which includes using toliet, bedpan, or urinal?: None Help from another person bathing (including washing, rinsing, drying)?: None Help from another person to put on and taking off regular upper body clothing?: None Help from another person to put on and taking off regular lower body clothing?: A Little 6 Click Score: 23   End of Session Equipment Utilized During Treatment: Gait belt;Rolling walker;Back brace  Activity Tolerance: Patient tolerated treatment well Patient left: in bed;with call bell/phone within reach;with family/visitor present  OT Visit Diagnosis: Pain;Other abnormalities of gait and mobility (R26.89)                Time: [id number] OT Time Calculation (min): 14 min Charges:  OT General Charges $OT Visit: 1  Visit OT Evaluation $OT Eval Low Complexity: 1 Low  08/31/2017 Martie RoundJulie Delara Shepheard, OTR/L Pager: [phone]  Iran PlanasMayberry, [place] BailiffJulie Lynn 08/31/2017, 9:39 AM

## 2017-08-31 NOTE — Progress Notes (Signed)
Patient alert and oriented, mae's well, voiding adequate amount of urine, swallowing without difficulty, no c/o pain at time of discharge. Patient discharged home with family. Script and discharged instructions given to patient. Patient and family stated understanding of instructions given. Patient has an appointment with Dr. Lovenia Shuckumosnki

## 2017-09-06 NOTE — Discharge Summary (Signed)
Patient ID: Suzanne Jackson MRN: [id number] DOB/AGE: 48/03/1970 48 y.o.  Admit date: 08/29/2017 Discharge date: 08/31/2017  Admission Diagnoses:  Active Problems:   Radiculopathy   Discharge Diagnoses:  Same  Past Medical History:  Diagnosis Date  . Arthritis    BACK  . Asthma   . Depression   . Frequency of urination   . History of kidney stones   . Hypertension   . Leg pain    Left  . Microhematuria   . Right ureteral stone   . Wears contact lenses     Surgeries: Procedure(s): LUMBAR 4-5 POSTERIOR SPINAL FUSION WITH INSTRUMENTATION AND ALLOGRAFT on 08/30/2017   Consultants: None  Discharged Condition: Improved  Hospital Course: Suzanne Jackson is an 47 y.o. female who was admitted 08/29/2017 for operative treatment of radiculopathy. Patient has severe unremitting pain that affects sleep, daily activities, and work/hobbies. After pre-op clearance the patient was taken to the operating room on 08/30/2017 and underwent  Procedure(s): LUMBAR 4-5 POSTERIOR SPINAL FUSION WITH INSTRUMENTATION AND ALLOGRAFT.    Patient was given perioperative antibiotics:  Anti-infectives (From admission, onward)   Start     Dose/Rate Route Frequency Ordered Stop   08/29/17 1700  ceFAZolin (ANCEF) IVPB 2g/100 mL premix     2 g 200 mL/hr over 30 Minutes Intravenous Every 8 hours 08/29/17 1325 08/30/17 0109   08/29/17 0700  ceFAZolin (ANCEF) IVPB 2g/100 mL premix     2 g 200 mL/hr over 30 Minutes Intravenous On call to O.R. 08/29/17 [id number] 08/29/17 [id number]       Patient was given sequential compression devices, early ambulation to prevent DVT.  Patient benefited maximally from hospital stay and there were no complications.    Recent vital signs: BP 116/66 (BP Location: Left Arm)   Pulse (!) 102   Temp 98.2 F (36.8 C) (Oral)   Resp 16   Ht 5\' 1"  (1.549 m)   Wt 51.2 kg   LMP 08/05/2017 (Approximate) Comment: neg preg test 08/30/17  SpO2 96%   BMI 21.33 kg/m    Discharge  Medications:   Allergies as of 08/31/2017      Reactions   Aspirin Anaphylaxis   Hydromorphone Itching   Ibuprofen Anaphylaxis   Percocet [oxycodone-acetaminophen] Itching      Medication List    STOP taking these medications   HYDROcodone-acetaminophen 7.5-325 MG tablet Commonly known as:  NORCO     TAKE these medications   amLODipine 10 MG tablet Commonly known as:  NORVASC Take 10 mg by mouth daily.   budesonide-formoterol 160-4.5 MCG/ACT inhaler Commonly known as:  SYMBICORT Inhale 1 puff into the lungs 2 (two) times daily.   cetirizine 10 MG tablet Commonly known as:  ZYRTEC Take 10 mg by mouth every morning.   FLUoxetine 40 MG capsule Commonly known as:  PROZAC Take 40 mg by mouth every morning.   hydrochlorothiazide 12.5 MG capsule Commonly known as:  MICROZIDE Take 12.5 mg by mouth daily.   hydrOXYzine 50 MG tablet Commonly known as:  ATARAX/VISTARIL Take 1 tablet (50 mg total) by mouth every 6 (six) hours as needed (refractory itching).   montelukast 10 MG tablet Commonly known as:  SINGULAIR Take 10 mg by mouth every morning.   potassium chloride SA 20 MEQ tablet Commonly known as:  K-DUR,KLOR-CON Take 20 mEq by mouth daily.   PROAIR HFA 108 (90 Base) MCG/ACT inhaler Generic drug:  albuterol Inhale 1-2 puffs into the lungs every 6 (six) hours as  needed for wheezing or shortness of breath.       Diagnostic Studies: Dg Lumbar Spine 2-3 Views  Result Date: 08/30/2017 CLINICAL DATA:  L4-5 posterior fusion and removal of hardware at L5-S1. EXAM: Operative LUMBAR SPINE - 2-3 VIEW 10 o'clock a.m.: COMPARISON:  Intraoperative lumbar spine x-rays yesterday. Localizing LATERAL lumbar spine x-ray earlier this morning at 7:51 a.m. FINDINGS: Interval removal of posterior hardware at L5-S1 with placement of posterior hardware at L4-5 since yesterday's intraoperative examination. Anatomic alignment. No visible complicating features. The bone fusion plugs at L5-S1  remain appropriately positioned. The lateral L4-5 fusion hardware is intact without complicating features. IMPRESSION: L4-5 posterior fusion and removal of L5-S1 hardware without complicating features. Electronically Signed   By: Hulan Saas M.D.   On: 08/30/2017 10:45   Dg Lumbar Spine 2-3 Views  Result Date: 08/29/2017 CLINICAL DATA:  L4-5 fusion EXAM: DG C-ARM 61-120 MIN; LUMBAR SPINE - 2-3 VIEW COMPARISON:  MRI 09/07/2016 FINDINGS: Remote posterior fusion changes at L5-S1. Lateral fusion noted from L4-5. No hardware bony complicating feature. IMPRESSION: L4-5 fusion without visible complicating feature. Electronically Signed   By: Charlett Nose M.D.   On: 08/29/2017 11:00   Dg Lumbar Spine 1 View  Result Date: 08/30/2017 CLINICAL DATA:  L4-5 posterior fusion with revision of previously placed hardware at L5-S1. L4-5 lateral fusion was performed yesterday. EXAM: Operative LUMBAR SPINE - 1 VIEW COMPARISON:  Intraoperative lumbar spine x-rays yesterday. Lumbar spine MRI 09/07/2016. FINDINGS: Localizer needles are present between the spinous processes of L2 and L3 and at the S1 level. Lateral surgical fusion hardware at L4-5 and posterior surgical hardware at L5-S1 are intact. IMPRESSION: Intraoperative localization needles at L2-3 and S1. Electronically Signed   By: Hulan Saas M.D.   On: 08/30/2017 10:37   Dg C-arm 1-60 Min  Result Date: 08/30/2017 CLINICAL DATA:  L4-5 posterior fusion and removal of hardware at L5-S1. EXAM: Operative LUMBAR SPINE - 2-3 VIEW 10 o'clock a.m.: COMPARISON:  Intraoperative lumbar spine x-rays yesterday. Localizing LATERAL lumbar spine x-ray earlier this morning at 7:51 a.m. FINDINGS: Interval removal of posterior hardware at L5-S1 with placement of posterior hardware at L4-5 since yesterday's intraoperative examination. Anatomic alignment. No visible complicating features. The bone fusion plugs at L5-S1 remain appropriately positioned. The lateral L4-5 fusion  hardware is intact without complicating features. IMPRESSION: L4-5 posterior fusion and removal of L5-S1 hardware without complicating features. Electronically Signed   By: Hulan Saas M.D.   On: 08/30/2017 10:45   Dg C-arm 1-60 Min  Result Date: 08/30/2017 CLINICAL DATA:  L4-5 posterior fusion and removal of hardware at L5-S1. EXAM: Operative LUMBAR SPINE - 2-3 VIEW 10 o'clock a.m.: COMPARISON:  Intraoperative lumbar spine x-rays yesterday. Localizing LATERAL lumbar spine x-ray earlier this morning at 7:51 a.m. FINDINGS: Interval removal of posterior hardware at L5-S1 with placement of posterior hardware at L4-5 since yesterday's intraoperative examination. Anatomic alignment. No visible complicating features. The bone fusion plugs at L5-S1 remain appropriately positioned. The lateral L4-5 fusion hardware is intact without complicating features. IMPRESSION: L4-5 posterior fusion and removal of L5-S1 hardware without complicating features. Electronically Signed   By: Hulan Saas M.D.   On: 08/30/2017 10:45   Dg C-arm 1-60 Min  Result Date: 08/29/2017 CLINICAL DATA:  L4-5 fusion EXAM: DG C-ARM 61-120 MIN; LUMBAR SPINE - 2-3 VIEW COMPARISON:  MRI 09/07/2016 FINDINGS: Remote posterior fusion changes at L5-S1. Lateral fusion noted from L4-5. No hardware bony complicating feature. IMPRESSION: L4-5 fusion without visible complicating feature.  Electronically Signed   By: Charlett Nose M.D.   On: 08/29/2017 11:00    Disposition: Discharge disposition: 01-Home or Self Care       Discharge Instructions    Discharge patient   Complete by:  As directed    Discharge disposition:  01-Home or Self Care   Discharge patient date:  08/31/2017      POD #1/2 s/p staged L4-L5 lateral/posterior fusion with removal of previous L5-S1 posterior instrumentation.  - resolved leg pain.  Mild right hip discomfort - Expected postoperative low back pain well-controlled - Itching resolved with Vistaril and  discontinuation of IV pain medications - up with PT/OT, encourage ambulation - Percocet for pain, Valium for muscle spasms - d/c home today with f/u in 2 weeks  Signed: Georga Bora 09/06/2017, 12:55 PM

## 2017-09-20 DIAGNOSIS — N939 Abnormal uterine and vaginal bleeding, unspecified: Secondary | ICD-10-CM | POA: Diagnosis not present

## 2017-09-20 DIAGNOSIS — N3281 Overactive bladder: Secondary | ICD-10-CM | POA: Diagnosis not present

## 2017-09-20 DIAGNOSIS — N951 Menopausal and female climacteric states: Secondary | ICD-10-CM | POA: Diagnosis not present

## 2017-09-21 DIAGNOSIS — Z9889 Other specified postprocedural states: Secondary | ICD-10-CM | POA: Diagnosis not present

## 2017-10-12 DIAGNOSIS — M5416 Radiculopathy, lumbar region: Secondary | ICD-10-CM | POA: Diagnosis not present

## 2017-10-25 ENCOUNTER — Other Ambulatory Visit: Payer: Self-pay | Admitting: Obstetrics & Gynecology

## 2017-10-25 DIAGNOSIS — Z720 Tobacco use: Secondary | ICD-10-CM | POA: Diagnosis not present

## 2017-10-25 DIAGNOSIS — N939 Abnormal uterine and vaginal bleeding, unspecified: Secondary | ICD-10-CM | POA: Diagnosis not present

## 2017-11-19 DIAGNOSIS — Z23 Encounter for immunization: Secondary | ICD-10-CM | POA: Diagnosis not present

## 2017-11-19 DIAGNOSIS — F322 Major depressive disorder, single episode, severe without psychotic features: Secondary | ICD-10-CM | POA: Diagnosis not present

## 2017-11-19 DIAGNOSIS — E876 Hypokalemia: Secondary | ICD-10-CM | POA: Diagnosis not present

## 2017-11-19 DIAGNOSIS — I10 Essential (primary) hypertension: Secondary | ICD-10-CM | POA: Diagnosis not present

## 2017-11-19 DIAGNOSIS — M545 Low back pain: Secondary | ICD-10-CM | POA: Diagnosis not present

## 2017-11-29 ENCOUNTER — Other Ambulatory Visit: Payer: Self-pay

## 2017-11-29 ENCOUNTER — Encounter (HOSPITAL_BASED_OUTPATIENT_CLINIC_OR_DEPARTMENT_OTHER): Payer: Self-pay | Admitting: *Deleted

## 2017-12-01 NOTE — H&P (Signed)
[id number] who presents for hysteroscopy, D&C, Novasure ablation.  Since this summer her periods have become irregular. She was given a course of provera, which did not help.  Bleeding will stop for maybe 2 days and then return to everyday bleeding. Bleeding is moderate to heavy- at times will soak through a pad in under an hour. This has been going on for over a the past 2 months.  Prior to this, periods were irregular, maybe every few months, but never this heavy or irregular. Not sure what makes the bleeding heavier or worse.  No other acute complaints  Last Hgb 03/2017: 13.9 Contraception: tubal Last pap: 03/2016- ASCUS, HPV Neg [place] (10/10): 7.7cm anteverted inhomogenous appearnce- no discrete masses seen. Endometrium within normal limits- 0.4cm. Normal ovaries bilaterally. EMB: benign proliferative endometrium  Medications:   Provera(medroxyPROGESTERone) 10 MG Tablet 1 tablet with food Orally Once a day   Hydrocodone-Acetaminophen 7.5-325 MG Tablet 1 tablet Orally every 4-6 hr as needed for pain   Hydrochlorothiazide 12.5 MG Capsule 1 capsule in the morning Orally Once a day   Amlodipine Besylate 10 MG Tablet 1 tablet Orally Once a day   Potassium Chloride ER 20 MEQ Tablet Extended Release 1 tablet with food Orally Once a day   Fluoxetine HCl 40 MG Capsule 1 tablet in the morning Orally Once a day   Symbicort(Budesonide-Formoterol Fumarate) 160-4.5 MCG/ACT Aerosol 1 puff Inhalation Twice a day   ProAir HFA(Albuterol Sulfate HFA) 108 (90 Base) MCG/ACT Aerosol Solution 2 puffs as needed Inhalation every 4 hrs   Montelukast Sodium 10 MG Tablet 1 tablet Orally Once a day   Zyrtec Allergy(Cetirizine HCl) 10 MG Tablet 1 tablet Orally Once a day   Lyrica(Pregabalin) 75 MG Capsule 1 capsule Orally twice a day, Notes: ortho   Myrbetriq 25 MG Tablet 1 tablet by mouth Once daily   HydrOXYzine HCl 50 MG Tablet 1 tablet as needed Orally every 6 hrs   Dicyclomine HCl 20 MG Tablet 1 capsule Orally Four  times a day as needed for abdominal pain   Medication List reviewed and reconciled with the patient    Past Medical History  Hypertension Asthma Depression. Seasonal allergies  Back pain Overactive bladder    Surgical History  laparoscopic cholecystectomy 1994  cystoscopy for stone 1997  left bunion   lumbar disc and fusion - Dr. Yevette Edwards 10/2008  bilateral tubal ligation 03/2009  left ureteral calculus - Dr. Patsi Sears 01/2015  lumbar 4-5 lateral interbody fusion - Dr. Yevette Edwards 08/2017   Family History  Father: alive, diagnosed with Hypertension, Colon cancer  Mother: alive, Diabetes, Hypertension, CHF (congestive heart failure), COPD (chronic obstructive pulmonary disease)  Maternal Grand Mother: deceased, Diabetes, CVA, Coronary artery disease  Sister 1: alive, Hypertension  1 sister(s) .   denies any GYN family cancer hx.   Social History  General:  EXPOSURE TO PASSIVE SMOKE: yes.  Alcohol: 6 per day.  DIET: regular.  EDUCATION: High School.  Seat belt use: yes.  DENTAL CARE: good.  Exercise: Walks occasionally.  Children: 3.  Caffeine: 3-5 servings daily, soda.  COMMUNICATION BARRIERS: none.  Tobacco use  cigarettes: Current smoker Frequency: 1.5 PPD Tobacco history last updated 10/25/2017 Marital Status: married.  OCCUPATION: Home.    Gyn History  Sexual activity currently sexually active.  Periods : irregular.  LMP july 23rd-current.  Last pap smear date 04/03/2016 ASCUS .  Last mammogram date 05/03/2017.  Abnormal pap smear yes.    OB History  OB History G5 P3023.  Allergies  Advil: wheeze (2013) - Allergy  Aspirin: wheeze (2013) - Allergy  Aleve: wheeze - Allergy   Hospitalization/Major Diagnostic Procedure  No Hospitalization History.   Review of Systems  CONSTITUTIONAL:  no Chills. no Fever. no Night sweats.  HEENT:  Blurrred vision no. no Double vision.  CARDIOLOGY:  no Chest pain.  RESPIRATORY:  no Shortness of breath. no Cough.   UROLOGY:  no Urinary frequency. no Urinary incontinence. no Urinary urgency.  GASTROENTEROLOGY:  no Abdominal pain. no Appetite change. no Change in bowel movements.  FEMALE REPRODUCTIVE:  See HPI for details. no Breast lumps or discharge. no Breast pain.  NEUROLOGY:  no Dizziness. no Headache. no Loss of consciousness.  PSYCHOLOGY:  no Anxiety. no Depression.  SKIN:  no Rash. no Hives.  HEMATOLOGY/LYMPH:  no Anemia. Fatigue yes. Using Blood Thinners no.     O: Examination performed in office  General Examination: CONSTITUTIONAL: well developed, well nourished.  SKIN: warm and dry, no rashes.  NECK: supple, normal appearance.  LUNGS: regular breathing rate and effort.  HEART: no murmurs, regular rate and rhythm.  ABDOMEN: soft and not tender, no masses palpated, no rebound, no rigidity.  MUSCULOSKELETAL no calf tenderness bilaterally.  EXTREMITIES: no edema present.  PSYCH: appropriate mood and affect.   A/P: 15XW [id number] who presents for hysteroscopy, D&C, Novasure ablation -NPO -LR @ 125cc/hr -SCDs to OR -Risk/benefits and alternatives discussed with patient including but not limited to risk of bleeding, infection, potential failure of device or inability to complete procedure as well as potential uterine perforation.  Questions and concerns were addressed and she desires to proceed.  Myna HidalgoJennifer Baylyn Sickles, DO [phone] (cell) [phone] (office)

## 2017-12-03 DIAGNOSIS — N939 Abnormal uterine and vaginal bleeding, unspecified: Secondary | ICD-10-CM | POA: Diagnosis not present

## 2017-12-03 DIAGNOSIS — Z01818 Encounter for other preprocedural examination: Secondary | ICD-10-CM | POA: Diagnosis not present

## 2017-12-04 ENCOUNTER — Encounter (HOSPITAL_BASED_OUTPATIENT_CLINIC_OR_DEPARTMENT_OTHER)
Admission: RE | Admit: 2017-12-04 | Discharge: 2017-12-04 | Disposition: A | Discharge status: Home / Self Care | Payer: BLUE CROSS/BLUE SHIELD | Source: Ambulatory Visit | Attending: Obstetrics & Gynecology | Admitting: Obstetrics & Gynecology

## 2017-12-04 DIAGNOSIS — N3281 Overactive bladder: Secondary | ICD-10-CM | POA: Diagnosis not present

## 2017-12-04 DIAGNOSIS — N939 Abnormal uterine and vaginal bleeding, unspecified: Secondary | ICD-10-CM | POA: Diagnosis not present

## 2017-12-04 DIAGNOSIS — Z79899 Other long term (current) drug therapy: Secondary | ICD-10-CM | POA: Diagnosis not present

## 2017-12-04 DIAGNOSIS — F17210 Nicotine dependence, cigarettes, uncomplicated: Secondary | ICD-10-CM | POA: Diagnosis not present

## 2017-12-04 DIAGNOSIS — J45909 Unspecified asthma, uncomplicated: Secondary | ICD-10-CM | POA: Diagnosis not present

## 2017-12-04 DIAGNOSIS — I10 Essential (primary) hypertension: Secondary | ICD-10-CM | POA: Diagnosis not present

## 2017-12-04 DIAGNOSIS — F329 Major depressive disorder, single episode, unspecified: Secondary | ICD-10-CM | POA: Diagnosis not present

## 2017-12-04 LAB — COMPREHENSIVE METABOLIC PANEL
ALK PHOS: 48 U/L (ref 38–126)
ALT: 12 U/L (ref 0–44)
AST: 19 U/L (ref 15–41)
Albumin: 3.5 g/dL (ref 3.5–5.0)
Anion gap: 11 (ref 5–15)
BILIRUBIN TOTAL: 0.4 mg/dL (ref 0.3–1.2)
BUN: 9 mg/dL (ref 6–20)
CALCIUM: 8.9 mg/dL (ref 8.9–10.3)
CO2: 26 mmol/L (ref 22–32)
CREATININE: 0.74 mg/dL (ref 0.44–1.00)
Chloride: 100 mmol/L (ref 98–111)
GFR calc non Af Amer: >60 mL/min (ref >60)
Glucose, Bld: 93 mg/dL (ref 70–99)
Potassium: 3 mmol/L — ABNORMAL LOW (ref 3.5–5.1)
Sodium: 137 mmol/L (ref 135–145)
TOTAL PROTEIN: 6.5 g/dL (ref 6.5–8.1)

## 2017-12-04 LAB — CBC
HEMATOCRIT: 35.4 % — AB (ref 36.0–46.0)
HEMOGLOBIN: 11.8 g/dL — AB (ref 12.0–15.0)
MCH: 31.6 pg (ref 26.0–34.0)
MCHC: 33.3 g/dL (ref 30.0–36.0)
MCV: 94.7 fL (ref 80.0–100.0)
Platelets: 284 10*3/uL (ref 150–400)
RBC: 3.74 MIL/uL — AB (ref 3.87–5.11)
RDW: 15.6 % — ABNORMAL HIGH (ref 11.5–15.5)
WBC: 6.2 10*3/uL (ref 4.0–10.5)
nRBC: 0 % (ref 0.0–0.2)

## 2017-12-04 NOTE — Pre-Procedure Instructions (Signed)
Labs resulted, showing K+ of 3.0, Dr. Lawana Chamberszan's office closed, [facility]. Called and spoke with patient. Instructed patient to take K+ supplement today and in am, and to eat K+ rich diet between now and midnight, then NPO. Requested patient to arrive 15 min earlier to allow time for iStat in am.

## 2017-12-04 NOTE — Progress Notes (Signed)
EKG reviewed by Dr. Germeroth, will proceed with surgery as scheduled. 

## 2017-12-05 ENCOUNTER — Ambulatory Visit (HOSPITAL_BASED_OUTPATIENT_CLINIC_OR_DEPARTMENT_OTHER): Payer: BLUE CROSS/BLUE SHIELD | Admitting: Certified Registered"

## 2017-12-05 ENCOUNTER — Encounter (HOSPITAL_BASED_OUTPATIENT_CLINIC_OR_DEPARTMENT_OTHER)
Admission: RE | Disposition: A | Discharge status: Home / Self Care | Payer: Self-pay | Source: Ambulatory Visit | Attending: Obstetrics & Gynecology

## 2017-12-05 ENCOUNTER — Encounter (HOSPITAL_BASED_OUTPATIENT_CLINIC_OR_DEPARTMENT_OTHER): Payer: Self-pay | Admitting: *Deleted

## 2017-12-05 ENCOUNTER — Ambulatory Visit (HOSPITAL_BASED_OUTPATIENT_CLINIC_OR_DEPARTMENT_OTHER)
Admission: RE | Admit: 2017-12-05 | Discharge: 2017-12-05 | Disposition: A | Discharge status: Home / Self Care | Payer: BLUE CROSS/BLUE SHIELD | Source: Ambulatory Visit | Attending: Obstetrics & Gynecology | Admitting: Obstetrics & Gynecology

## 2017-12-05 DIAGNOSIS — F329 Major depressive disorder, single episode, unspecified: Secondary | ICD-10-CM | POA: Diagnosis not present

## 2017-12-05 DIAGNOSIS — F17210 Nicotine dependence, cigarettes, uncomplicated: Secondary | ICD-10-CM | POA: Insufficient documentation

## 2017-12-05 DIAGNOSIS — I10 Essential (primary) hypertension: Secondary | ICD-10-CM | POA: Diagnosis not present

## 2017-12-05 DIAGNOSIS — N858 Other specified noninflammatory disorders of uterus: Secondary | ICD-10-CM | POA: Diagnosis not present

## 2017-12-05 DIAGNOSIS — N3281 Overactive bladder: Secondary | ICD-10-CM | POA: Diagnosis not present

## 2017-12-05 DIAGNOSIS — N938 Other specified abnormal uterine and vaginal bleeding: Secondary | ICD-10-CM | POA: Diagnosis not present

## 2017-12-05 DIAGNOSIS — Z79899 Other long term (current) drug therapy: Secondary | ICD-10-CM | POA: Diagnosis not present

## 2017-12-05 DIAGNOSIS — J45909 Unspecified asthma, uncomplicated: Secondary | ICD-10-CM | POA: Insufficient documentation

## 2017-12-05 DIAGNOSIS — N939 Abnormal uterine and vaginal bleeding, unspecified: Secondary | ICD-10-CM | POA: Diagnosis not present

## 2017-12-05 HISTORY — PX: DILITATION & CURRETTAGE/HYSTROSCOPY WITH NOVASURE ABLATION: SHX5568

## 2017-12-05 LAB — POCT I-STAT, CHEM 8
BUN: 9 mg/dL (ref 6–20)
CREATININE: 0.6 mg/dL (ref 0.44–1.00)
Calcium, Ion: 1.06 mmol/L — ABNORMAL LOW (ref 1.15–1.40)
Chloride: 101 mmol/L (ref 98–111)
GLUCOSE: 108 mg/dL — AB (ref 70–99)
HCT: 38 % (ref 36.0–46.0)
HEMOGLOBIN: 12.9 g/dL (ref 12.0–15.0)
Potassium: 3 mmol/L — ABNORMAL LOW (ref 3.5–5.1)
Sodium: 139 mmol/L (ref 135–145)
TCO2: 26 mmol/L (ref 22–32)

## 2017-12-05 SURGERY — DILATATION & CURETTAGE/HYSTEROSCOPY WITH NOVASURE ABLATION
Anesthesia: General | Site: Vagina

## 2017-12-05 MED ORDER — LACTATED RINGERS IV SOLN: INTRAVENOUS | Status: DC

## 2017-12-05 MED ORDER — SILVER NITRATE-POT NITRATE 75-25 % EX MISC
CUTANEOUS | Status: AC
  Filled 2017-12-05: qty 1

## 2017-12-05 MED ORDER — ROCURONIUM BROMIDE 50 MG/5ML IV SOSY
PREFILLED_SYRINGE | INTRAVENOUS | Status: AC
  Filled 2017-12-05: qty 5

## 2017-12-05 MED ORDER — PROPOFOL 10 MG/ML IV BOLUS
INTRAVENOUS | Status: DC | PRN
  Administered 2017-12-05: 200 mg via INTRAVENOUS
  Administered 2017-12-05: 100 mg via INTRAVENOUS

## 2017-12-05 MED ORDER — HYDROMORPHONE HCL 1 MG/ML IJ SOLN: 0.2500 mg | INTRAMUSCULAR | Status: DC | PRN

## 2017-12-05 MED ORDER — LACTATED RINGERS IV SOLN
INTRAVENOUS | Status: DC
  Administered 2017-12-05: 10:00:00 via INTRAVENOUS

## 2017-12-05 MED ORDER — DEXAMETHASONE SODIUM PHOSPHATE 10 MG/ML IJ SOLN
INTRAMUSCULAR | Status: AC
  Filled 2017-12-05: qty 1

## 2017-12-05 MED ORDER — SCOPOLAMINE 1 MG/3DAYS TD PT72: 1.0000 | MEDICATED_PATCH | Freq: Once | TRANSDERMAL | Status: DC | PRN

## 2017-12-05 MED ORDER — PROMETHAZINE HCL 25 MG/ML IJ SOLN: 6.2500 mg | INTRAMUSCULAR | Status: DC | PRN

## 2017-12-05 MED ORDER — LIDOCAINE HCL (CARDIAC) PF 100 MG/5ML IV SOSY
PREFILLED_SYRINGE | INTRAVENOUS | Status: DC | PRN
  Administered 2017-12-05: 60 mg via INTRAVENOUS

## 2017-12-05 MED ORDER — OXYCODONE HCL 5 MG PO TABS
5.0000 mg | ORAL_TABLET | Freq: Once | ORAL | Status: AC
  Administered 2017-12-05: 5 mg via ORAL

## 2017-12-05 MED ORDER — BUPIVACAINE HCL (PF) 0.5 % IJ SOLN
INTRAMUSCULAR | Status: AC
  Filled 2017-12-05: qty 30

## 2017-12-05 MED ORDER — MIDAZOLAM HCL 2 MG/2ML IJ SOLN
INTRAMUSCULAR | Status: AC
  Filled 2017-12-05: qty 2

## 2017-12-05 MED ORDER — FENTANYL CITRATE (PF) 100 MCG/2ML IJ SOLN
100.0000 ug | Freq: Once | INTRAMUSCULAR | Status: AC
  Administered 2017-12-05: 25 ug via INTRAVENOUS

## 2017-12-05 MED ORDER — MIDAZOLAM HCL 2 MG/2ML IJ SOLN
1.0000 mg | INTRAMUSCULAR | Status: DC | PRN
  Administered 2017-12-05: 2 mg via INTRAVENOUS

## 2017-12-05 MED ORDER — PROPOFOL 10 MG/ML IV BOLUS
INTRAVENOUS | Status: AC
  Filled 2017-12-05: qty 20

## 2017-12-05 MED ORDER — DEXAMETHASONE SODIUM PHOSPHATE 10 MG/ML IJ SOLN
INTRAMUSCULAR | Status: DC | PRN
  Administered 2017-12-05: 5 mg via INTRAVENOUS

## 2017-12-05 MED ORDER — OXYCODONE HCL 5 MG PO TABS
ORAL_TABLET | ORAL | Status: AC
  Filled 2017-12-05: qty 1

## 2017-12-05 MED ORDER — FENTANYL CITRATE (PF) 100 MCG/2ML IJ SOLN
INTRAMUSCULAR | Status: AC
  Filled 2017-12-05: qty 2

## 2017-12-05 MED ORDER — ONDANSETRON HCL 4 MG/2ML IJ SOLN
INTRAMUSCULAR | Status: AC
  Filled 2017-12-05: qty 2

## 2017-12-05 MED ORDER — FENTANYL CITRATE (PF) 100 MCG/2ML IJ SOLN
50.0000 ug | INTRAMUSCULAR | Status: AC | PRN
  Administered 2017-12-05 (×2): 25 ug via INTRAVENOUS
  Administered 2017-12-05: 50 ug via INTRAVENOUS

## 2017-12-05 MED ORDER — ONDANSETRON HCL 4 MG/2ML IJ SOLN
INTRAMUSCULAR | Status: DC | PRN
  Administered 2017-12-05: 4 mg via INTRAVENOUS

## 2017-12-05 MED ORDER — DEXMEDETOMIDINE HCL IN NACL 200 MCG/50ML IV SOLN
INTRAVENOUS | Status: DC | PRN
  Administered 2017-12-05 (×2): 8 ug via INTRAVENOUS

## 2017-12-05 MED ORDER — LIDOCAINE-EPINEPHRINE (PF) 1 %-1:200000 IJ SOLN
INTRAMUSCULAR | Status: AC
  Filled 2017-12-05: qty 30

## 2017-12-05 MED ORDER — LIDOCAINE 2% (20 MG/ML) 5 ML SYRINGE
INTRAMUSCULAR | Status: AC
  Filled 2017-12-05: qty 5

## 2017-12-05 MED ORDER — FENTANYL CITRATE (PF) 100 MCG/2ML IJ SOLN
25.0000 ug | INTRAMUSCULAR | Status: DC | PRN
  Administered 2017-12-05: 25 ug via INTRAVENOUS

## 2017-12-05 MED ORDER — BUPIVACAINE-EPINEPHRINE (PF) 0.25% -1:200000 IJ SOLN
INTRAMUSCULAR | Status: AC
  Filled 2017-12-05: qty 30

## 2017-12-05 SURGICAL SUPPLY — 16 items
ABLATOR SURESOUND NOVASURE (ABLATOR) ×1 IMPLANT
BRIEF STRETCH FOR OB PAD XXL (UNDERPADS AND DIAPERS) ×2 IMPLANT
CANISTER SUCT 3000ML PPV (MISCELLANEOUS) ×2 IMPLANT
CATH ROBINSON RED A/P 16FR (CATHETERS) ×1 IMPLANT
DILATOR CANAL MILEX (MISCELLANEOUS) IMPLANT
GLOVE BIOGEL PI IND STRL 6.5 (GLOVE) ×1 IMPLANT
GLOVE BIOGEL PI IND STRL 7.0 (GLOVE) ×1 IMPLANT
GLOVE BIOGEL PI INDICATOR 6.5 (GLOVE) ×1
GLOVE BIOGEL PI INDICATOR 7.0 (GLOVE) ×1
GLOVE ECLIPSE 6.5 STRL STRAW (GLOVE) ×2 IMPLANT
GOWN STRL REUS W/TWL LRG LVL3 (GOWN DISPOSABLE) ×4 IMPLANT
KIT PROCEDURE FLUENT (KITS) ×2 IMPLANT
PACK VAGINAL MINOR WOMEN LF (CUSTOM PROCEDURE TRAY) ×2 IMPLANT
PAD OB MATERNITY 4.3X12.25 (PERSONAL CARE ITEMS) ×2 IMPLANT
SLEEVE SCD COMPRESS KNEE MED (MISCELLANEOUS) ×2 IMPLANT
TOWEL GREEN STERILE FF (TOWEL DISPOSABLE) ×3 IMPLANT

## 2017-12-05 NOTE — Anesthesia Procedure Notes (Signed)
Procedure Name: LMA Insertion Performed by: Karen KitchensKelly, Margan Elias M, CRNA Pre-anesthesia Checklist: Patient identified, Emergency Drugs available, Suction available, Patient being monitored and Timeout performed Patient Re-evaluated:Patient Re-evaluated prior to induction Oxygen Delivery Method: Circle system utilized Preoxygenation: Pre-oxygenation with 100% oxygen Induction Type: IV induction LMA: LMA inserted LMA Size: 3.0 Number of attempts: 1 Placement Confirmation: positive ETCO2,  CO2 detector and breath sounds checked- equal and bilateral Tube secured with: Tape Dental Injury: Injury to lip  Comments: Pt's lips very friable and chapped, small lip laceration noted on left lower lip.  May have been present prior to induction.  Applied lip lubrication to both lips.mkelly

## 2017-12-05 NOTE — Op Note (Signed)
Operative Report  PreOp: abnormal uterine bleeding PostOp: same Procedure:  Hysteroscopy, Dilation and Curettage, Endometrial ablation- Novasure Surgeon: Dr. Myna HidalgoJennifer Dilyn Jackson Anesthesia: General Complications:none EBL: Minimal IVF:1000cc Discrepancy: 60cc  Findings: 8cm uterus with proliferative endometrium, both ostia visualized  Specimens: endometrial curettings  Procedure: The patient was taken to the operating room where she underwent general anesthesia without difficulty. The patient was placed in a low lithotomy position using Allen stirrups. The patient was examined with the findings as noted above.  She was then prepped and draped in the normal sterile fashion. A sterile speculum was inserted into the vagina. A single tooth tenaculum was placed on the anterior lip of the cervix. The uterus was then sounded to 8cm with a cervical length of 3.5cm The diagnostic hysteroscope was then inserted without difficulty and noted to have the findings as listed above. Visualization was achieved using NS as a distending medium. The hysteroscope was removed and sharp curettage was performed. The tissue was sent to pathology.   Attention was then turned to the Novasure. The Novasure was set up according to manufacture instructions. The cavity length was set to 4.5. The Novasure was inserted, seating test performed and the cavity width was noted to be 3.5. Cavity assessment was performed and passed. The device was then activated for 91sec at a power level of 87. Upon completion, the Novasure was removed and the hysteroscope was reinserted. Global ablation was visualized and no uterine perforation was seen. All instrument were then removed. Hemostasis was observed at the cervical site.  The patient was repositioned to the supine position. The patient tolerated the procedure without any complications and taken to recovery in stable condition.   Myna HidalgoJennifer Ezechiel [place], DO [phone] (pager) [phone]  (office)

## 2017-12-05 NOTE — Anesthesia Postprocedure Evaluation (Signed)
Anesthesia Post Note  Patient: Suzanne Jackson  Procedure(s) Performed: DILATATION & CURETTAGE/HYSTEROSCOPY WITH NOVASURE ABLATION (N/A Vagina )     Patient location during evaluation: PACU Anesthesia Type: General Level of consciousness: awake and alert Pain management: pain level controlled Vital Signs Assessment: post-procedure vital signs reviewed and stable Respiratory status: spontaneous breathing, nonlabored ventilation, respiratory function stable and patient connected to nasal cannula oxygen Cardiovascular status: blood pressure returned to baseline and stable Postop Assessment: no apparent nausea or vomiting Anesthetic complications: no    Last Vitals:  Vitals:   12/05/17 1200 12/05/17 1230  BP: 124/67 128/72  Pulse: 80 79  Resp:  16  Temp: 36.5 C   SpO2: 97% 96%    Last Pain:  Vitals:   12/05/17 1230  TempSrc:   PainSc: 5                  Ryan P Ellender

## 2017-12-05 NOTE — Interval H&P Note (Signed)
History and Physical Interval Note:  12/05/2017 9:23 AM  Suzanne Jackson  has presented today for surgery, with the diagnosis of N93.9 Abnormal uterine bleeding  The various methods of treatment have been discussed with the patient and family. After consideration of risks, benefits and other options for treatment, the patient has consented to  Procedure(s): DILATATION & CURETTAGE/HYSTEROSCOPY WITH NOVASURE ABLATION (N/A) as a surgical intervention .  The patient's history has been reviewed, patient examined, no change in status, stable for surgery.  I have reviewed the patient's chart and labs.  Questions were answered to the patient's satisfaction.     Sharon SellerJennifer M Donyae Kohn

## 2017-12-05 NOTE — Anesthesia Preprocedure Evaluation (Addendum)
Anesthesia Evaluation  Patient identified by MRN, date of birth, ID band Patient awake    Reviewed: Allergy & Precautions, NPO status , Patient's Chart, lab work & pertinent test results  Airway Mallampati: II  TM Distance: >3 FB Neck ROM: Full    Dental no notable dental hx.    Pulmonary asthma , Current Smoker,    Pulmonary exam normal breath sounds clear to auscultation       Cardiovascular hypertension, Pt. on medications Normal cardiovascular exam Rhythm:Regular Rate:Normal  ECG: NSR, rate 85   Neuro/Psych PSYCHIATRIC DISORDERS Depression negative neurological ROS     GI/Hepatic negative GI ROS, Neg liver ROS,   Endo/Other  negative endocrine ROS  Renal/GU negative Renal ROS     Musculoskeletal negative musculoskeletal ROS (+)   Abdominal   Peds  Hematology  (+) anemia ,   Anesthesia Other Findings Abnormal uterine bleeding  Reproductive/Obstetrics S/p BTL                            Anesthesia Physical Anesthesia Plan  ASA: III  Anesthesia Plan: General   Post-op Pain Management:    Induction: Intravenous  PONV Risk Score and Plan: 3 and Midazolam, Dexamethasone, Ondansetron and Treatment may vary due to age or medical condition  Airway Management Planned: LMA  Additional Equipment:   Intra-op Plan:   Post-operative Plan: Extubation in OR  Informed Consent: I have reviewed the patients History and Physical, chart, labs and discussed the procedure including the risks, benefits and alternatives for the proposed anesthesia with the patient or authorized representative who has indicated his/her understanding and acceptance.   Dental advisory given  Plan Discussed with: CRNA  Anesthesia Plan Comments:         Anesthesia Quick Evaluation

## 2017-12-05 NOTE — Transfer of Care (Signed)
Immediate Anesthesia Transfer of Care Note  Patient: Suzanne Jackson  Procedure(s) Performed: DILATATION & CURETTAGE/HYSTEROSCOPY WITH NOVASURE ABLATION (N/A )  Patient Location: PACU  Anesthesia Type:General  Level of Consciousness: awake, alert  and oriented  Airway & Oxygen Therapy: Patient Spontanous Breathing and Patient connected to face mask oxygen  Post-op Assessment: Report given to RN and Post -op Vital signs reviewed and stable  Post vital signs: Reviewed and stable  Last Vitals:  Vitals Value Taken Time  BP    Temp    Pulse 70 12/05/2017 10:15 AM  Resp 17 12/05/2017 10:15 AM  SpO2 99 % 12/05/2017 10:15 AM  Vitals shown include unvalidated device data.  Last Pain:  Vitals:   12/05/17 0922  TempSrc: Oral  PainSc: 0-No pain         Complications: No apparent anesthesia complications

## 2017-12-05 NOTE — Discharge Instructions (Addendum)
°  Post Anesthesia Home Care Instructions  Activity: Get plenty of rest for the remainder of the day. A responsible individual must stay with you for 24 hours following the procedure.  For the next 24 hours, DO NOT: -Drive a car -Advertising copywriterperate machinery -Drink alcoholic beverages -Take any medication unless instructed by your physician -Make any legal decisions or sign important papers.  Meals: Start with liquid foods such as gelatin or soup. Progress to regular foods as tolerated. Avoid greasy, spicy, heavy foods. If nausea and/or vomiting occur, drink only clear liquids until the nausea and/or vomiting subsides. Call your physician if vomiting continues.  Special Instructions/Symptoms: Your throat may feel dry or sore from the anesthesia or the breathing tube placed in your throat during surgery. If this causes discomfort, gargle with warm salt water. The discomfort should disappear within 24 hours.  If you had a scopolamine patch placed behind your ear for the management of post- operative nausea and/or vomiting:  1. The medication in the patch is effective for 72 hours, after which it should be removed.  Wrap patch in a tissue and discard in the trash. Wash hands thoroughly with soap and water. 2. You may remove the patch earlier than 72 hours if you experience unpleasant side effects which may include dry mouth, dizziness or visual disturbances. 3. Avoid touching the patch. Wash your hands with soap and water after contact with the patch.   HOME INSTRUCTIONS  Please note any unusual or excessive bleeding, pain, swelling. Mild dizziness or drowsiness are normal for about 24 hours after surgery.   Shower when comfortable  Restrictions: No driving for 24 hours or while taking pain medications.  Activity:  No heavy lifting (> 10 lbs), nothing in vagina (no tampons, douching, or intercourse) x 2 weeks; no tub baths for 2 weeks Vaginal spotting is expected but if your bleeding is heavy,  period like,  please call the office    Diet:  You may return to your regular diet.  Do not eat large meals.  Eat small frequent meals throughout the day.  Continue to drink a good amount of water at least 6-8 glasses of water per day, hydration is very important for the healing process.  Pain Management: Take over the counter tylenol as needed for pain  Always take prescription pain medication with food, it may cause constipation, increase fluids and fiber and you may want to take an over-the-counter stool softener like Colace as needed up to 2x a day.    Alcohol -- Avoid for 24 hours and while taking pain medications.  Nausea: Take sips of ginger ale or soda  Fever -- Call physician if temperature over 101 degrees  Follow up:  If you do not already have a follow up appointment scheduled, please call the office at [phone].  If you experience fever (a temperature greater than 100.4), pain unrelieved by pain medication, shortness of breath, swelling of a single leg, or any other symptoms which are concerning to you please the office immediately.

## 2017-12-06 ENCOUNTER — Encounter (HOSPITAL_BASED_OUTPATIENT_CLINIC_OR_DEPARTMENT_OTHER): Payer: Self-pay | Admitting: Obstetrics & Gynecology

## 2017-12-06 DIAGNOSIS — N939 Abnormal uterine and vaginal bleeding, unspecified: Secondary | ICD-10-CM | POA: Diagnosis not present

## 2018-02-26 DIAGNOSIS — M19041 Primary osteoarthritis, right hand: Secondary | ICD-10-CM | POA: Diagnosis not present

## 2018-03-06 DIAGNOSIS — Z720 Tobacco use: Secondary | ICD-10-CM | POA: Diagnosis not present

## 2018-03-06 DIAGNOSIS — R51 Headache: Secondary | ICD-10-CM | POA: Diagnosis not present

## 2018-05-21 DIAGNOSIS — I10 Essential (primary) hypertension: Secondary | ICD-10-CM | POA: Diagnosis not present

## 2018-05-21 DIAGNOSIS — M545 Low back pain: Secondary | ICD-10-CM | POA: Diagnosis not present

## 2018-05-21 DIAGNOSIS — E876 Hypokalemia: Secondary | ICD-10-CM | POA: Diagnosis not present

## 2018-05-21 DIAGNOSIS — F322 Major depressive disorder, single episode, severe without psychotic features: Secondary | ICD-10-CM | POA: Diagnosis not present

## 2018-06-30 ENCOUNTER — Other Ambulatory Visit: Payer: Self-pay

## 2018-06-30 ENCOUNTER — Encounter (HOSPITAL_COMMUNITY): Payer: Self-pay | Admitting: Emergency Medicine

## 2018-06-30 ENCOUNTER — Emergency Department (HOSPITAL_COMMUNITY)
Admission: EM | Admit: 2018-06-30 | Discharge: 2018-06-30 | Disposition: A | Discharge status: Home / Self Care | Payer: BC Managed Care – PPO | Attending: Emergency Medicine | Admitting: Emergency Medicine

## 2018-06-30 ENCOUNTER — Emergency Department (HOSPITAL_COMMUNITY): Payer: BC Managed Care – PPO

## 2018-06-30 DIAGNOSIS — J45909 Unspecified asthma, uncomplicated: Secondary | ICD-10-CM | POA: Diagnosis not present

## 2018-06-30 DIAGNOSIS — Z79899 Other long term (current) drug therapy: Secondary | ICD-10-CM | POA: Diagnosis not present

## 2018-06-30 DIAGNOSIS — R0789 Other chest pain: Secondary | ICD-10-CM | POA: Insufficient documentation

## 2018-06-30 DIAGNOSIS — R079 Chest pain, unspecified: Secondary | ICD-10-CM | POA: Diagnosis not present

## 2018-06-30 DIAGNOSIS — I10 Essential (primary) hypertension: Secondary | ICD-10-CM | POA: Diagnosis not present

## 2018-06-30 DIAGNOSIS — R0781 Pleurodynia: Secondary | ICD-10-CM | POA: Diagnosis not present

## 2018-06-30 DIAGNOSIS — R1032 Left lower quadrant pain: Secondary | ICD-10-CM | POA: Diagnosis not present

## 2018-06-30 DIAGNOSIS — F17210 Nicotine dependence, cigarettes, uncomplicated: Secondary | ICD-10-CM | POA: Diagnosis not present

## 2018-06-30 MED ORDER — METHOCARBAMOL 500 MG PO TABS: 500.0000 mg | ORAL_TABLET | Freq: Three times a day (TID) | ORAL | 0 refills | Status: DC | PRN

## 2018-06-30 MED ORDER — HYDROCODONE-ACETAMINOPHEN 5-325 MG PO TABS
1.0000 | ORAL_TABLET | Freq: Once | ORAL | Status: AC
  Administered 2018-06-30: 1 via ORAL
  Filled 2018-06-30: qty 1

## 2018-06-30 NOTE — ED Triage Notes (Signed)
Pt presents to ED POV c/o L side pain. Directly underneath ribs. Started around 0000, pt reports no bowel changes, no n/v/d. Pain is a 7/10, pt describes it as sharp. Movement makes it worse.

## 2018-06-30 NOTE — ED Provider Notes (Signed)
[facility] EMERGENCY DEPARTMENT Provider Note   CSN: [id number] Arrival date & time: 06/30/18  0307    History   Chief Complaint Chief Complaint  Patient presents with  . Flank Pain    HPI Suzanne Jackson is a 48 y.o. female.     Patient presents with complaints of left-sided rib pain.  Patient reports that symptoms began tonight.  She denies any injury.  She has a sharp pain in the lateral ribs, next to her breast.  This worsens whenever she moves her torso.  No associated shortness of breath.  Pain is described as very sharp in nature.     Past Medical History:  Diagnosis Date  . Arthritis    BACK  . Asthma   . Depression   . Frequency of urination   . History of kidney stones   . Hypertension   . Leg pain    Left  . Microhematuria   . Right ureteral stone   . Wears contact lenses     Patient Active Problem List   Diagnosis Date Noted  . Radiculopathy 08/29/2017    Past Surgical History:  Procedure Laterality Date  . ANTERIOR LAT LUMBAR FUSION N/A 08/29/2017   Procedure: LUMBAR FOUR-FIVE LATERAL INTERBODY FUSION WITH INSTRUMENTATION AND ALLOGRAFT;  Surgeon: Phylliss Bob, MD;  Location: [facility];  Service: Orthopedics;  Laterality: N/A;  . CYSTO/  URETEROSCOPIC STONE EXTRACTION  1998  . CYSTOSCOPY WITH RETROGRADE PYELOGRAM, URETEROSCOPY AND STENT PLACEMENT Right 02/12/2015   Procedure: CYSTOSCOPY WITH RETROGRADE PYELOGRAM, URETEROSCOPY;  Surgeon: Carolan Clines, MD;  Location: [facility];  Service: Urology;  Laterality: Right;  . DILITATION & CURRETTAGE/HYSTROSCOPY WITH NOVASURE ABLATION N/A 12/05/2017   Procedure: DILATATION & CURETTAGE/HYSTEROSCOPY WITH NOVASURE ABLATION;  Surgeon: Janyth Pupa, DO;  Location: [facility];  Service: Gynecology;  Laterality: N/A;  . [place]  . LAPAROSCOPIC TUBAL LIGATION Bilateral 05-06-2009   fulgeration  . POSTERIOR FUSION LUMBAR SPINE   10-28-2008   L5 - S1  . STONE EXTRACTION WITH BASKET Right 02/12/2015   Procedure: STONE EXTRACTION WITH BASKET AND LASER LITHOTRIPSY;  Surgeon: Carolan Clines, MD;  Location: [facility];  Service: Urology;  Laterality: Right;  . TUBAL LIGATION       OB History   No obstetric history on file.      Home Medications    Prior to Admission medications   Medication Sig Start Date End Date Taking? Authorizing Provider  amLODipine (NORVASC) 10 MG tablet Take 10 mg by mouth daily.  10/11/16   [provider]  budesonide-formoterol (SYMBICORT) 160-4.5 MCG/ACT inhaler Inhale 1 puff into the lungs 2 (two) times daily.    [provider]  FLUoxetine (PROZAC) 40 MG capsule Take 40 mg by mouth every morning.  11/05/12   [provider]  hydrochlorothiazide (MICROZIDE) 12.5 MG capsule Take 12.5 mg by mouth daily. 10/11/16   [provider]  hydrOXYzine (ATARAX/VISTARIL) 50 MG tablet Take 1 tablet (50 mg total) by mouth every 6 (six) hours as needed (refractory itching). 08/31/17   McKenzie, Lennie Muckle, PA-C  methocarbamol (ROBAXIN) 500 MG tablet Take 1 tablet (500 mg total) by mouth every 8 (eight) hours as needed for muscle spasms. 06/30/18   Orpah Greek, MD  montelukast (SINGULAIR) 10 MG tablet Take 10 mg by mouth every morning.  11/21/12   [provider]  potassium chloride SA (K-DUR,KLOR-CON) 20 MEQ tablet Take 20 mEq by mouth daily.  [provider]  PROAIR HFA 108 (90 BASE) MCG/ACT inhaler Inhale 1-2 puffs into the lungs every 6 (six) hours as needed for wheezing or shortness of breath.  11/21/12   [provider]    Family History No family history on file.  Social History Social History   Tobacco Use  . Smoking status: Current Every Day Smoker    Packs/day: 1.00    Years: 25.00    Pack years: 25.00    Types: Cigarettes  . Smokeless tobacco: Never Used  Substance Use Topics  . Alcohol use: Yes     Comment: occasional  . Drug use: No     Allergies   Aspirin, Hydromorphone, Ibuprofen, and Percocet [oxycodone-acetaminophen]   Review of Systems Review of Systems  Respiratory: Negative for shortness of breath.   Musculoskeletal:       Chest wall pain  All other systems reviewed and are negative.    Physical Exam Updated Vital Signs BP 132/76 (BP Location: Right Arm)   Pulse 87   Temp 97.9 F (36.6 C) (Oral)   Resp 16   Ht 5\' 1"  (1.549 m)   Wt 53.5 kg   SpO2 95%   BMI 22.30 kg/m   Physical Exam Vitals signs and nursing note reviewed.  Constitutional:      General: She is not in acute distress.    Appearance: Normal appearance. She is well-developed.  HENT:     Head: Normocephalic and atraumatic.     Right Ear: Hearing normal.     Left Ear: Hearing normal.     Nose: Nose normal.  Eyes:     Conjunctiva/sclera: Conjunctivae normal.     Pupils: Pupils are equal, round, and reactive to light.  Neck:     Musculoskeletal: Normal range of motion and neck supple.  Cardiovascular:     Rate and Rhythm: Regular rhythm.     Heart sounds: S1 normal and S2 normal. No murmur. No friction rub. No gallop.   Pulmonary:     Effort: Pulmonary effort is normal. No respiratory distress.     Breath sounds: Normal breath sounds.  Chest:     Chest wall: No tenderness.    Abdominal:     General: Bowel sounds are normal.     Palpations: Abdomen is soft.     Tenderness: There is no abdominal tenderness. There is no guarding or rebound. Negative signs include Murphy's sign and McBurney's sign.     Hernia: No hernia is present.  Musculoskeletal: Normal range of motion.  Skin:    General: Skin is warm and dry.     Findings: No rash.  Neurological:     Mental Status: She is alert and oriented to person, place, and time.     GCS: GCS eye subscore is 4. GCS verbal subscore is 5. GCS motor subscore is 6.     Cranial Nerves: No cranial nerve deficit.     Sensory: No sensory deficit.      Coordination: Coordination normal.  Psychiatric:        Speech: Speech normal.        Behavior: Behavior normal.        Thought Content: Thought content normal.      ED Treatments / Results  Labs (all labs ordered are listed, but only abnormal results are displayed) Labs Reviewed - No data to display  EKG EKG Interpretation  Date/Time:  Sunday June 30 2018 03:50:37 EDT Ventricular Rate:  77 PR Interval:  QRS Duration: 106 QT Interval:  491 QTC Calculation: 556 R Axis:   54 Text Interpretation:  Sinus rhythm Left ventricular hypertrophy Prolonged QT interval No significant change since last tracing Confirmed by Gilda CreasePollina, Asif Muchow J [phone]([id number]) on 06/30/2018 5:13:13 AM   Radiology Dg Ribs Unilateral W/chest Left  Result Date: 06/30/2018 CLINICAL DATA:  Left-sided rib and pleuritic chest pain. No known injury. EXAM: LEFT RIBS AND CHEST - 3+ VIEW COMPARISON:  02/28/2017 chest radiograph FINDINGS: No fracture or other bone lesions are seen involving the ribs. There is no evidence of pneumothorax or pleural effusion. Both lungs are clear. Heart size and mediastinal contours are within normal limits. IMPRESSION: Negative. Electronically Signed   By: Myles RosenthalJohn  Stahl M.D.   On: 06/30/2018 04:34    Procedures Procedures (including critical care time)  Medications Ordered in ED Medications  HYDROcodone-acetaminophen (NORCO/VICODIN) 5-325 MG per tablet 1 tablet (has no administration in time range)     Initial Impression / Assessment and Plan / ED Course  I have reviewed the triage vital signs and the nursing notes.  Pertinent labs & imaging results that were available during my care of the patient were reviewed by me and considered in my medical decision making (see chart for details).        Presents to the ER for evaluation of left-sided chest wall pain.  Pain began earlier tonight, unclear what caused it.  She denies any trauma.  Patient has severe pain with movement of the  torso.  Area is also exquisitely tender to the touch.  This is very consistent with musculoskeletal chest pain.  No concern for coronary artery disease or cardiac etiology of this pain.  EKG unremarkable.  Chest x-ray including left ribs unremarkable.  Will treat for musculoskeletal pain, follow-up as needed.  Final Clinical Impressions(s) / ED Diagnoses   Final diagnoses:  Chest wall pain    ED Discharge Orders         Ordered    methocarbamol (ROBAXIN) 500 MG tablet  Every 8 hours PRN     06/30/18 0539           Gilda CreasePollina, Amirr Achord J, MD 06/30/18 [phone]

## 2018-06-30 NOTE — ED Notes (Signed)
Discharge instructions discussed with pt. Pt verbalized understanding. Pt stable and ambulatory. No signature pad available. 

## 2018-07-03 DIAGNOSIS — R1084 Generalized abdominal pain: Secondary | ICD-10-CM | POA: Diagnosis not present

## 2018-07-03 DIAGNOSIS — R8271 Bacteriuria: Secondary | ICD-10-CM | POA: Diagnosis not present

## 2018-07-03 DIAGNOSIS — N202 Calculus of kidney with calculus of ureter: Secondary | ICD-10-CM | POA: Diagnosis not present

## 2018-07-03 DIAGNOSIS — N132 Hydronephrosis with renal and ureteral calculous obstruction: Secondary | ICD-10-CM | POA: Diagnosis not present

## 2018-07-03 DIAGNOSIS — R310 Gross hematuria: Secondary | ICD-10-CM | POA: Diagnosis not present

## 2018-07-17 DIAGNOSIS — N201 Calculus of ureter: Secondary | ICD-10-CM | POA: Diagnosis not present

## 2018-07-17 DIAGNOSIS — R8271 Bacteriuria: Secondary | ICD-10-CM | POA: Diagnosis not present

## 2018-07-17 DIAGNOSIS — R3121 Asymptomatic microscopic hematuria: Secondary | ICD-10-CM | POA: Diagnosis not present

## 2018-07-17 DIAGNOSIS — R1084 Generalized abdominal pain: Secondary | ICD-10-CM | POA: Diagnosis not present

## 2018-08-01 DIAGNOSIS — N132 Hydronephrosis with renal and ureteral calculous obstruction: Secondary | ICD-10-CM | POA: Diagnosis not present

## 2018-08-01 DIAGNOSIS — N201 Calculus of ureter: Secondary | ICD-10-CM | POA: Diagnosis not present

## 2018-08-05 ENCOUNTER — Other Ambulatory Visit: Payer: Self-pay | Admitting: Urology

## 2018-08-07 NOTE — H&P (Signed)
Office Visit Report     08/01/2018   --------------------------------------------------------------------------------   Suzanne Jackson  MRN: [id number]  DOB: August 13, 1970, 48 year old Female  SSN: [id number]   PRIMARY CARE:    REFERRING:  Donavan Burnet, MD  PROVIDER:  Salley Slaughter, P.A.  LOCATION:  [facility] [phone]     --------------------------------------------------------------------------------   CC: I have pain in the flank.  HPI: Suzanne Jackson is a 48 year-old female established patient who is here for flank pain.  07/03/18:  This patient complains of sharp right flank pain since last night. She also reports gross hematuria 1 week ago. She denies dysuria. She denies fever, chills, nausea, or vomiting.   CT scan completed 07/04/2018, indicates mild right hydronephrosis secondary to a distal right 6 mm ureteral stone.   07/17/18:  She presents to the office for KUB. She has been taking Flomax, and straining her urine and she has not seen a stone pass. She continues to have intermittent right sided flank/pelvic pain, and now she is having urinary urgency and frequency. She denies fever, chills, nausea, or vomiting.   08/01/18:  This patient c/o persistent right renal colic and urinary urgency and frequency. She has not taken Flomax x 2 days. She denies gross hematuria. She denies fever, chills, nausea, or vomiting.   The problem is on the right side. The pain is sharp. The pain is intermittent.     ALLERGIES: Aspirin TABS - Trouble Breathing Ibuprofen TABS - Trouble Breathing    MEDICATIONS: Hydrochlorothiazide 1 PO Daily  Percocet 5 mg-325 mg tablet 1 tablet PO Q 6 H PRN  Percocet 5 mg-325 mg tablet 1 tablet PO Q 6 H PRN  Percocet 7.5 mg-325 mg tablet 1 tablet PO Q 6 H PRN  [place] Allergy  Amlodipine Besilate 1 PO Daily  Hydrocodone-Acetaminophen 5 mg-500 mg capsule Oral  Hydroxyzine Hcl 50 mg tablet 1 tablet PO Daily PRN  Prozac 40 mg  capsule Oral  Singulair 10 mg tablet Oral  Symbicort 160 mcg-4.5 mcg/actuation hfa aerosol with adapter Inhalation     GU PSH: Cysto Uretero Lithotripsy - 2017 Cystoscopy And Treatment - 2017       [facility] Notes: back surgery 2019   NON-GU PSH: Back Surgery (Unspecified) Cholecystectomy (open) - 2017     GU PMH: Flank Pain - 07/17/2018, - 07/03/2018 Gross hematuria - 07/03/2018 Ureteral calculus - 2018, Right ureteral stone, - 2017 Microscopic hematuria (Chronic), Secondary to mid left ureteral calculus - 2017 Other microscopic hematuria, Microscopic hematuria - 2017 Renal calculus, Calculus of kidney - 2017    NON-GU PMH: Encounter for general adult medical examination without abnormal findings, Encounter for preventive health examination - 2017    FAMILY HISTORY: 1 Daughter - Daughter 2 sons - Son cardiac disorder - Runs In Family Colon Cancer - Father Diabetes - Runs In Family Hypertension - Runs In Family   SOCIAL HISTORY: Marital Status: Married Preferred Language: English; Ethnicity: Not Hispanic Or Latino; Race: White Current Smoking Status: Patient smokes. Has smoked since 11/17/1990. Smokes 1 pack per day.  Drinks 2 drinks per day. Light Drinker.  Drinks 4+ caffeinated drinks per day. Patient's occupation is/was Works at home.    REVIEW OF SYSTEMS:    GU Review Female:   Patient reports frequent urination. Patient denies hard to postpone urination, burning /pain with urination, get up at night to urinate, leakage of urine, stream starts and stops, trouble starting your stream, have to strain  to urinate, and being pregnant.  Gastrointestinal (Upper):   Patient denies nausea, vomiting, and indigestion/ heartburn.  Gastrointestinal (Lower):   Patient denies diarrhea and constipation.  Constitutional:   Patient denies fever, night sweats, weight loss, and fatigue.  Skin:   Patient denies skin rash/ lesion and itching.  Eyes:   Patient denies blurred vision and double  vision.  Ears/ Nose/ Throat:   Patient denies sore throat and sinus problems.  Hematologic/Lymphatic:   Patient denies swollen glands and easy bruising.  Cardiovascular:   Patient denies leg swelling and chest pains.  Respiratory:   Patient denies cough and shortness of breath.  Endocrine:   Patient denies excessive thirst.  Musculoskeletal:   Patient denies back pain and joint pain.  Neurological:   Patient denies headaches and dizziness.  Psychologic:   Patient denies depression and anxiety.   VITAL SIGNS:      08/01/2018 02:28 PM  Weight 113 lb / 51.26 kg  Height 61 in / 154.94 cm  BP 134/82 mmHg  Pulse 84 /min  Temperature 97.8 F / 36.5 C  BMI 21.3 kg/m   MULTI-SYSTEM PHYSICAL EXAMINATION:    Constitutional: Well-nourished. No physical deformities. Normally developed. Good grooming.  Neck: Neck symmetrical, not swollen. Normal tracheal position.  Respiratory: No labored breathing, no use of accessory muscles.   Neurologic / Psychiatric: Oriented to time, oriented to place, oriented to person. No depression, no anxiety, no agitation.  Musculoskeletal: Normal gait and station of head and neck.     PAST DATA REVIEWED:  Source Of History:  Patient  Urine Test Review:   Urinalysis, Urine Culture  Urodynamics Review:   Review Bladder Scan  X-Ray Review: C.T. Abdomen/Pelvis: Reviewed Films. Reviewed Report. Discussed With Patient.     PROCEDURES:         C.T. Urogram - 74176  7 mm stone within the distal right ureter resulting in moderate right hydroureteronephrosis. 6 mm stone within the inferior pole the left kidney.      Patient confirmed No Neulasta OnPro Device.            KUB - [id number]  A single view of the abdomen is obtained.      Patient confirmed No Neulasta OnPro Device.           PVR Ultrasound - 56213  Scanned Volume: 17 cc         Urinalysis w/Scope - 81001 Dipstick Dipstick Cont'd Micro  Color: Yellow Bilirubin: Neg mg/dL WBC/hpf: 0 - 5/hpf   Appearance: Clear Ketones: Trace mg/dL RBC/hpf: 0 - 2/hpf  Specific Gravity: 1.025 Blood: 2+ ery/uL Bacteria: Few (10-25/hpf)  pH: 6.0 Protein: 2+ mg/dL Cystals: NS (Not Seen)  Glucose: Neg mg/dL Urobilinogen: 0.2 mg/dL Casts: NS (Not Seen)    Nitrites: Neg Trichomonas: Not Present    Leukocyte Esterase: Trace leu/uL Mucous: Present      Epithelial Cells: 0 - 5/hpf      Yeast: NS (Not Seen)      Sperm: Not Present    Notes: Microscopic not concentrated due to volume.    ASSESSMENT:      ICD-10 Details  1 GU:   Flank Pain - R10.84   2   Ureteral calculus - N20.1    PLAN:            Medications New Meds: Tamsulosin Hcl 0.4 mg capsule 1 capsule PO Q HS   #30  0 Refill(s)  Hydroxyzine Hcl 50 mg tablet 1 tablet PO Daily PRN   #  30  0 Refill(s)            Orders Labs Urine Culture  X-Rays: C.T. Stone Protocol Without Contrast  X-Ray Notes: History:  Hematuria: Yes/No  Patient to see MD after exam: Yes/No  Previous exam: CT / IVP/ US/ KUB/ None  When:  Where:  Diabetic: Yes/ No  BUN/ Creatinine:  Date of last BUN Creatinine:  Weight in pounds:  Allergy- IV Contrast: Yes/ No  Conflicting diabetic meds: Yes/ No  Diabetic Meds:  Prior Authorization #: [id number]           Schedule Return Visit/Planned Activity: 2 Weeks - KUB             Note: Prior to OV  Return Visit/Planned Activity: 2 Weeks - Office Visit, Extender             Note: F/U Megan          Document Letter(s):  Created for Patient: Clinical Summary         Notes:   This patient complains of persistent right renal colic, and her urinalysis is negative. On KUB, there are no obvious renal nor ureteral calculi. On CT scan, 7 mm distal, right ureteral calculus persists. We discussed treatment options including surgical intervention and MET. She prefers to continue with MET. Will order a urine culture today. I emphasized the importance of continuing Flomax, in combination with vigorous hydration  and straining her urine. Per her request, will prescribe hydroxyzine secondary to puritis when taking pain medication. Will plan for a follow-up in 2 weeks with a repeat KUB, or sooner if necessary. She was given strict instructions to notify the office if she experiences including but not limited to: intolerable pain, fever, chills, nausea, or vomiting.        Next Appointment:      Next Appointment: 08/15/2018 02:30 PM    Appointment Type: KUB    Location: [facility] [phone]    Provider: KUB KUB    Reason for Visit: 2 wk kub and ov      * Signed by Salley Slaughter, P.A. on 08/02/18 at 10:48 AM (EDT)*       APPENDED NOTES:  I discussed CT scan results with this patient today, and she would like to plan for ureteroscopy..     * Signed by Salley Slaughter, P.A. on 08/02/18 at 10:48 AM (EDT)*        Urine culture is negative.     * Signed by Salley Slaughter, P.A. on 08/05/18 at 10:47 AM (EDT)*        Per Dr. Alyson Ingles, will plan for ESWL.     * Signed by Salley Slaughter, P.A. on 08/05/18 at 10:49 AM (EDT)*

## 2018-08-08 ENCOUNTER — Ambulatory Visit (HOSPITAL_COMMUNITY)
Admission: RE | Admit: 2018-08-08 | Discharge: 2018-08-08 | Disposition: A | Discharge status: Home / Self Care | Payer: BC Managed Care – PPO | Source: Other Acute Inpatient Hospital | Attending: Urology | Admitting: Urology

## 2018-08-08 ENCOUNTER — Encounter (HOSPITAL_COMMUNITY): Payer: Self-pay | Admitting: General Practice

## 2018-08-08 ENCOUNTER — Encounter (HOSPITAL_COMMUNITY)
Admission: RE | Disposition: A | Discharge status: Home / Self Care | Payer: Self-pay | Source: Other Acute Inpatient Hospital | Attending: Urology

## 2018-08-08 ENCOUNTER — Ambulatory Visit (HOSPITAL_COMMUNITY): Payer: BC Managed Care – PPO

## 2018-08-08 DIAGNOSIS — F17210 Nicotine dependence, cigarettes, uncomplicated: Secondary | ICD-10-CM | POA: Insufficient documentation

## 2018-08-08 DIAGNOSIS — I10 Essential (primary) hypertension: Secondary | ICD-10-CM | POA: Insufficient documentation

## 2018-08-08 DIAGNOSIS — J45909 Unspecified asthma, uncomplicated: Secondary | ICD-10-CM | POA: Insufficient documentation

## 2018-08-08 DIAGNOSIS — N132 Hydronephrosis with renal and ureteral calculous obstruction: Secondary | ICD-10-CM | POA: Insufficient documentation

## 2018-08-08 DIAGNOSIS — Z01818 Encounter for other preprocedural examination: Secondary | ICD-10-CM | POA: Diagnosis not present

## 2018-08-08 DIAGNOSIS — Z79891 Long term (current) use of opiate analgesic: Secondary | ICD-10-CM | POA: Insufficient documentation

## 2018-08-08 DIAGNOSIS — N200 Calculus of kidney: Secondary | ICD-10-CM | POA: Diagnosis not present

## 2018-08-08 DIAGNOSIS — N201 Calculus of ureter: Secondary | ICD-10-CM | POA: Diagnosis not present

## 2018-08-08 DIAGNOSIS — Z7951 Long term (current) use of inhaled steroids: Secondary | ICD-10-CM | POA: Insufficient documentation

## 2018-08-08 DIAGNOSIS — Z79899 Other long term (current) drug therapy: Secondary | ICD-10-CM | POA: Insufficient documentation

## 2018-08-08 HISTORY — PX: EXTRACORPOREAL SHOCK WAVE LITHOTRIPSY: SHX1557

## 2018-08-08 SURGERY — LITHOTRIPSY, ESWL
Anesthesia: LOCAL | Laterality: Right

## 2018-08-08 MED ORDER — CIPROFLOXACIN HCL 500 MG PO TABS
500.0000 mg | ORAL_TABLET | ORAL | Status: AC
  Administered 2018-08-08: 500 mg via ORAL
  Filled 2018-08-08: qty 1

## 2018-08-08 MED ORDER — DIAZEPAM 5 MG PO TABS
10.0000 mg | ORAL_TABLET | ORAL | Status: AC
  Administered 2018-08-08: 10 mg via ORAL
  Filled 2018-08-08: qty 2

## 2018-08-08 MED ORDER — OXYCODONE-ACETAMINOPHEN 5-325 MG PO TABS: 1.0000 | ORAL_TABLET | ORAL | 0 refills | Status: DC | PRN

## 2018-08-08 MED ORDER — SODIUM CHLORIDE 0.9 % IV SOLN
INTRAVENOUS | Status: DC
  Administered 2018-08-08: 09:00:00 via INTRAVENOUS

## 2018-08-08 MED ORDER — DIPHENHYDRAMINE HCL 25 MG PO CAPS
25.0000 mg | ORAL_CAPSULE | ORAL | Status: AC
  Administered 2018-08-08: 25 mg via ORAL
  Filled 2018-08-08: qty 1

## 2018-08-08 NOTE — Discharge Instructions (Addendum)
1. You should strain your urine and collect all fragments and bring them to your follow up appointment.  °2. You should take your pain medication as needed.  Please call if your pain is severe to the point that it is not controlled with your pain medication. °3. You should call if you develop fever > 101 or persistent nausea or vomiting. °4. Your doctor may prescribe tamsulosin to take to help facilitate stone passage. ° ° ° °Lithotripsy, Care After °This sheet gives you information about how to care for yourself after your procedure. Your health care provider may also give you more specific instructions. If you have problems or questions, contact your health care provider. °What can I expect after the procedure? °After the procedure, it is common to have: °· Some blood in your urine. This should only last for a few days. °· Soreness in your back, sides, or upper abdomen for a few days. °· Blotches or bruises on your back where the pressure wave entered the skin. °· Pain, discomfort, or nausea when pieces (fragments) of the kidney stone move through the tube that carries urine from the kidney to the bladder (ureter). Stone fragments may pass soon after the procedure, but they may continue to pass for up to 4-8 weeks. °? If you have severe pain or nausea, contact your health care provider. This may be caused by a large stone that was not broken up, and this may mean that you need more treatment. °· Some pain or discomfort during urination. °· Some pain or discomfort in the lower abdomen or (in men) at the base of the penis. °Follow these instructions at home: °Medicines °· Take over-the-counter and prescription medicines only as told by your health care provider. °· If you were prescribed an antibiotic medicine, take it as told by your health care provider. Do not stop taking the antibiotic even if you start to feel better. °· Do not drive for 24 hours if you were given a medicine to help you relax (sedative). °· Do  not drive or use heavy machinery while taking prescription pain medicine. °Eating and drinking ° °  ° °· Drink enough water and fluids to keep your urine clear or pale yellow. This helps any remaining pieces of the stone to pass. It can also help prevent new stones from forming. °· Eat plenty of fresh fruits and vegetables. °· Follow instructions from your health care provider about eating and drinking restrictions. You may be instructed: °? To reduce how much salt (sodium) you eat or drink. Check ingredients and nutrition facts on packaged foods and beverages. °? To reduce how much meat you eat. °· Eat the recommended amount of calcium for your age and gender. Ask your health care provider how much calcium you should have. °General instructions °· Get plenty of rest. °· Most people can resume normal activities 1-2 days after the procedure. Ask your health care provider what activities are safe for you. °· Your health care provider may direct you to lie in a certain position (postural drainage) and tap firmly (percuss) over your kidney area to help stone fragments pass. Follow instructions as told by your health care provider. °· If directed, strain all urine through the strainer that was provided by your health care provider. °? Keep all fragments for your health care provider to see. Any stones that are found may be sent to a medical lab for examination. The stone may be as small as a grain of salt. °·   Keep all follow-up visits as told by your health care provider. This is important. °Contact a health care provider if: °· You have pain that is severe or does not get better with medicine. °· You have nausea that is severe or does not go away. °· You have blood in your urine longer than your health care provider told you to expect. °· You have more blood in your urine. °· You have pain during urination that does not go away. °· You urinate more frequently than usual and this does not go away. °· You develop a rash  or any other possible signs of an allergic reaction. °Get help right away if: °· You have severe pain in your back, sides, or upper abdomen. °· You have severe pain while urinating. °· Your urine is very dark red. °· You have blood in your stool (feces). °· You cannot pass any urine at all. °· You feel a strong urge to urinate after emptying your bladder. °· You have a fever or chills. °· You develop shortness of breath, difficulty breathing, or chest pain. °· You have severe nausea that leads to persistent vomiting. °· You faint. °Summary °· After this procedure, it is common to have some pain, discomfort, or nausea when pieces (fragments) of the kidney stone move through the tube that carries urine from the kidney to the bladder (ureter). If this pain or nausea is severe, however, you should contact your health care provider. °· Most people can resume normal activities 1-2 days after the procedure. Ask your health care provider what activities are safe for you. °· Drink enough water and fluids to keep your urine clear or pale yellow. This helps any remaining pieces of the stone to pass, and it can help prevent new stones from forming. °· If directed, strain your urine and keep all fragments for your health care provider to see. Fragments or stones may be as small as a grain of salt. °· Get help right away if you have severe pain in your back, sides, or upper abdomen or have severe pain while urinating. °This information is not intended to replace advice given to you by your health care provider. Make sure you discuss any questions you have with your health care provider. °Document Released: 01/22/2007 Document Revised: 04/15/2018 Document Reviewed: 11/24/2015 °[employer]vier Patient Education © 2020 [employer] ° ° ° °Moderate Conscious Sedation, Adult, Care After °These instructions provide you with information about caring for yourself after your procedure. Your health care provider may also give you more specific  instructions. Your treatment has been planned according to current medical practices, but problems sometimes occur. Call your health care provider if you have any problems or questions after your procedure. °What can I expect after the procedure? °After your procedure, it is common: °· To feel sleepy for several hours. °· To feel clumsy and have poor balance for several hours. °· To have poor judgment for several hours. °· To vomit if you eat too soon. °Follow these instructions at home: °For at least 24 hours after the procedure: ° °· Do not: °? Participate in activities where you could fall or become injured. °? Drive. °? Use heavy machinery. °? Drink alcohol. °? Take sleeping pills or medicines that cause drowsiness. °? Make important decisions or sign legal documents. °? Take care of children on your own. °· Rest. °Eating and drinking °· Follow the diet recommended by your health care provider. °· If you vomit: °? Drink water, juice, or   soup when you can drink without vomiting. °? Make sure you have little or no nausea before eating solid foods. °General instructions °· Have a responsible adult stay with you until you are awake and alert. °· Take over-the-counter and prescription medicines only as told by your health care provider. °· If you smoke, do not smoke without supervision. °· Keep all follow-up visits as told by your health care provider. This is important. °Contact a health care provider if: °· You keep feeling nauseous or you keep vomiting. °· You feel light-headed. °· You develop a rash. °· You have a fever. °Get help right away if: °· You have trouble breathing. °This information is not intended to replace advice given to you by your health care provider. Make sure you discuss any questions you have with your health care provider. °Document Released: 10/23/2012 Document Revised: 12/15/2016 Document Reviewed: 04/24/2015 °Elsevier Patient Education © 2020 [employer] ° °

## 2018-08-08 NOTE — Op Note (Signed)
See [facility] operative note scanned into chart. Also because of the size, density, location and other factors that cannot be anticipated I feel this will likely be a staged procedure. This fact supersedes any indication in the scanned [facility] operative note to the contrary.  

## 2018-08-08 NOTE — Interval H&P Note (Signed)
History and Physical Interval Note:  08/08/2018 10:35 AM  Suzanne Jackson  has presented today for surgery, with the diagnosis of RIGHT URETERAL STONE.  The various methods of treatment have been discussed with the patient and family. After consideration of risks, benefits and other options for treatment, the patient has consented to  Procedure(s): EXTRACORPOREAL SHOCK WAVE LITHOTRIPSY (ESWL) (Right) as a surgical intervention.  The patient's history has been reviewed, patient examined, no change in status, stable for surgery.  I have reviewed the patient's chart and labs.  Questions were answered to the patient's satisfaction.     [employer]

## 2018-08-09 ENCOUNTER — Encounter (HOSPITAL_COMMUNITY): Payer: Self-pay | Admitting: Urology

## 2018-08-26 DIAGNOSIS — N201 Calculus of ureter: Secondary | ICD-10-CM | POA: Diagnosis not present

## 2018-10-08 ENCOUNTER — Emergency Department (HOSPITAL_COMMUNITY): Payer: BC Managed Care – PPO

## 2018-10-08 ENCOUNTER — Other Ambulatory Visit: Payer: Self-pay

## 2018-10-08 ENCOUNTER — Emergency Department (HOSPITAL_COMMUNITY)
Admission: EM | Admit: 2018-10-08 | Discharge: 2018-10-08 | Disposition: A | Discharge status: Home / Self Care | Payer: BC Managed Care – PPO | Attending: Emergency Medicine | Admitting: Emergency Medicine

## 2018-10-08 DIAGNOSIS — S0990XA Unspecified injury of head, initial encounter: Secondary | ICD-10-CM | POA: Diagnosis not present

## 2018-10-08 DIAGNOSIS — I10 Essential (primary) hypertension: Secondary | ICD-10-CM | POA: Insufficient documentation

## 2018-10-08 DIAGNOSIS — G44319 Acute post-traumatic headache, not intractable: Secondary | ICD-10-CM | POA: Insufficient documentation

## 2018-10-08 DIAGNOSIS — S199XXA Unspecified injury of neck, initial encounter: Secondary | ICD-10-CM | POA: Diagnosis not present

## 2018-10-08 DIAGNOSIS — Z79899 Other long term (current) drug therapy: Secondary | ICD-10-CM | POA: Insufficient documentation

## 2018-10-08 DIAGNOSIS — S01112D Laceration without foreign body of left eyelid and periocular area, subsequent encounter: Secondary | ICD-10-CM | POA: Insufficient documentation

## 2018-10-08 DIAGNOSIS — S161XXD Strain of muscle, fascia and tendon at neck level, subsequent encounter: Secondary | ICD-10-CM | POA: Insufficient documentation

## 2018-10-08 DIAGNOSIS — S134XXD Sprain of ligaments of cervical spine, subsequent encounter: Secondary | ICD-10-CM | POA: Diagnosis not present

## 2018-10-08 DIAGNOSIS — F17210 Nicotine dependence, cigarettes, uncomplicated: Secondary | ICD-10-CM | POA: Diagnosis not present

## 2018-10-08 DIAGNOSIS — R51 Headache: Secondary | ICD-10-CM | POA: Diagnosis not present

## 2018-10-08 DIAGNOSIS — S0181XA Laceration without foreign body of other part of head, initial encounter: Secondary | ICD-10-CM | POA: Diagnosis not present

## 2018-10-08 DIAGNOSIS — W109XXD Fall (on) (from) unspecified stairs and steps, subsequent encounter: Secondary | ICD-10-CM | POA: Insufficient documentation

## 2018-10-08 DIAGNOSIS — M542 Cervicalgia: Secondary | ICD-10-CM | POA: Diagnosis not present

## 2018-10-08 DIAGNOSIS — S139XXD Sprain of joints and ligaments of unspecified parts of neck, subsequent encounter: Secondary | ICD-10-CM

## 2018-10-08 DIAGNOSIS — W19XXXD Unspecified fall, subsequent encounter: Secondary | ICD-10-CM

## 2018-10-08 MED ORDER — MORPHINE SULFATE (PF) 4 MG/ML IV SOLN
4.0000 mg | Freq: Once | INTRAVENOUS | Status: AC
  Administered 2018-10-08: 4 mg via INTRAMUSCULAR
  Filled 2018-10-08: qty 1

## 2018-10-08 MED ORDER — MORPHINE SULFATE (PF) 4 MG/ML IV SOLN: 4.0000 mg | Freq: Once | INTRAVENOUS | Status: DC

## 2018-10-08 MED ORDER — HYDROXYZINE HCL 25 MG PO TABS
25.0000 mg | ORAL_TABLET | Freq: Once | ORAL | Status: AC
  Administered 2018-10-08: 25 mg via ORAL
  Filled 2018-10-08: qty 1

## 2018-10-08 NOTE — ED Triage Notes (Signed)
Pt here from [facility] for a ct of her head and neck after tripping and falling walking in her garage last night ,

## 2018-10-08 NOTE — Discharge Instructions (Signed)
Take your home hydrocodone for your pain.  I would also suggest placing heat to your neck.  Have your sutures removed in approximately 1 week.  Follow-up with PCP for reevaluation if you continue to have symptoms beyond 3 days.

## 2018-10-08 NOTE — ED Provider Notes (Signed)
[place] EMERGENCY DEPARTMENT Provider Note   CSN: [id number] Arrival date & time: 10/08/18  1507   History   Chief Complaint Head injury  HPI Suzanne Jackson is a 48 y.o. female with past medical history significant for chronic back pain, asthma, depression, hypertension who presents for evaluation after fall.  Patient states she was out with her husband yesterday evening when she had a few drinks.  Patient states she was walking up the stairs from her house and tripped on her son's toy.  Patient states she was on the second step approximately 3 feet off the ground when she fell hitting the left anterior portion of her head.  Patient denies LOC, anticoagulation.  Patient states she was ambulatory after the fall.  When she woke up this morning she noted a laceration to her left frontal forehead above her left eye.  She was seen at urgent care where she had suturing done.  Patient states she did have posterior midline neck pain and was sent here for CT scan of her head and neck.  Patient states she took 1 of her hydrocodone which she takes for chronic back pain last night however is not taken anything since.  Denies dizziness, blurred vision, eye pain, facial pain, dental pain, unilateral weakness, chest pain, shortness of breath, extremity pain, back pain, bowel or bladder continence, saddle paresthesia.  Rates her pain an 8/10.  Denies additional aggravating or alleviating factors.  Wound sutured with 6 Prolene sutures and tetanus updated at urgent care.  Note from urgent care reviewed.  History obtained from patient and family room as well as past medical records.  No interpreter was used.     HPI  Past Medical History:  Diagnosis Date   Arthritis    BACK   Asthma    Depression    Frequency of urination    History of kidney stones    Hypertension    Leg pain    Left   Microhematuria    Right ureteral stone    Wears contact lenses     Patient  Active Problem List   Diagnosis Date Noted   Radiculopathy 08/29/2017    Past Surgical History:  Procedure Laterality Date   ANTERIOR LAT LUMBAR FUSION N/A 08/29/2017   Procedure: LUMBAR FOUR-FIVE LATERAL INTERBODY FUSION WITH INSTRUMENTATION AND ALLOGRAFT;  Surgeon: Phylliss Bob, MD;  Location: [facility];  Service: Orthopedics;  Laterality: N/A;   CYSTO/  URETEROSCOPIC STONE EXTRACTION  1998   CYSTOSCOPY WITH RETROGRADE PYELOGRAM, URETEROSCOPY AND STENT PLACEMENT Right 02/12/2015   Procedure: CYSTOSCOPY WITH RETROGRADE PYELOGRAM, URETEROSCOPY;  Surgeon: Carolan Clines, MD;  Location: [place];  Service: Urology;  Laterality: Right;   DILITATION & CURRETTAGE/HYSTROSCOPY WITH NOVASURE ABLATION N/A 12/05/2017   Procedure: DILATATION & CURETTAGE/HYSTEROSCOPY WITH NOVASURE ABLATION;  Surgeon: Janyth Pupa, DO;  Location: [place];  Service: Gynecology;  Laterality: N/A;   EXTRACORPOREAL SHOCK WAVE LITHOTRIPSY Right 08/08/2018   Procedure: EXTRACORPOREAL SHOCK WAVE LITHOTRIPSY (ESWL);  Surgeon: Raynelle Bring, MD;  Location: [facility];  Service: Urology;  Laterality: Right;   LAPAROSCOPIC CHOLECYSTECTOMY  1994   LAPAROSCOPIC TUBAL LIGATION Bilateral 05-06-2009   fulgeration   POSTERIOR FUSION LUMBAR SPINE  10-28-2008   L5 - S1   STONE EXTRACTION WITH BASKET Right 02/12/2015   Procedure: STONE EXTRACTION WITH BASKET AND LASER LITHOTRIPSY;  Surgeon: Carolan Clines, MD;  Location: [place];  Service: Urology;  Laterality: Right;   TUBAL LIGATION  OB History   No obstetric history on file.      Home Medications    Prior to Admission medications   Medication Sig Start Date End Date Taking? Authorizing Provider  amLODipine (NORVASC) 10 MG tablet Take 10 mg by mouth daily.  10/11/16   [provider]  budesonide-formoterol (SYMBICORT) 160-4.5 MCG/ACT inhaler Inhale 1 puff into the lungs 2 (two) times daily.     [provider]  FLUoxetine (PROZAC) 40 MG capsule Take 40 mg by mouth every morning.  11/05/12   [provider]  hydrochlorothiazide (MICROZIDE) 12.5 MG capsule Take 12.5 mg by mouth daily. 10/11/16   [provider]  hydrOXYzine (ATARAX/VISTARIL) 50 MG tablet Take 1 tablet (50 mg total) by mouth every 6 (six) hours as needed (refractory itching). 08/31/17   McKenzie, Eilene Ghazi, PA-C  methocarbamol (ROBAXIN) 500 MG tablet Take 1 tablet (500 mg total) by mouth every 8 (eight) hours as needed for muscle spasms. 06/30/18   Gilda Crease, MD  montelukast (SINGULAIR) 10 MG tablet Take 10 mg by mouth every morning.  11/21/12   [provider]  oxyCODONE-acetaminophen (PERCOCET) 5-325 MG tablet Take 1 tablet by mouth every 4 (four) hours as needed for severe pain. 08/08/18 08/08/19  Heloise Purpura, MD  oxyCODONE-acetaminophen (PERCOCET/ROXICET) 5-325 MG tablet Take 1-2 tablets by mouth every 4 (four) hours as needed for severe pain.    [provider]  potassium chloride SA (K-DUR,KLOR-CON) 20 MEQ tablet Take 20 mEq by mouth daily.    [provider]  PROAIR HFA 108 (90 BASE) MCG/ACT inhaler Inhale 1-2 puffs into the lungs every 6 (six) hours as needed for wheezing or shortness of breath.  11/21/12   [provider]  tamsulosin (FLOMAX) 0.4 MG CAPS capsule Take 0.4 mg by mouth.    [provider]    Family History No family history on file.  Social History Social History   Tobacco Use   Smoking status: Current Every Day Smoker    Packs/day: 1.00    Years: 25.00    Pack years: 25.00    Types: Cigarettes   Smokeless tobacco: Never Used  Substance Use Topics   Alcohol use: Yes    Comment: occasional   Drug use: No     Allergies   Aspirin, Hydromorphone, Ibuprofen, and Percocet [oxycodone-acetaminophen]   Review of Systems Review of Systems  Constitutional: Negative.   HENT: Negative.   Respiratory: Negative.    Cardiovascular: Negative.   Gastrointestinal: Negative.   Genitourinary: Negative.   Musculoskeletal: Negative.   Skin: Positive for wound.  Neurological: Positive for headaches. Negative for dizziness, tremors, seizures, syncope, facial asymmetry, speech difficulty, weakness, light-headedness and numbness.  All other systems reviewed and are negative.    Physical Exam Updated Vital Signs BP (!) 181/81    Pulse 78    Temp 98.5 F (36.9 C) (Oral)    Resp 18    SpO2 95%   Physical Exam Vitals signs and nursing note reviewed.  Constitutional:      General: She is not in acute distress.    Appearance: She is well-developed. She is not ill-appearing, toxic-appearing or diaphoretic.  HENT:     Head: Normocephalic. No raccoon eyes, Battle's sign, right periorbital erythema or left periorbital erythema.     Jaw: There is normal jaw occlusion.      Comments: 3cm sutured laceration to left superior eyebrow over anterior forehead.  No bleeding or drainage.  No tenderness to facial  bones.  No periorbital erythema, raccoon eyes or battle sign.  Jaw occlusion normal.    Right Ear: No hemotympanum.     Left Ear: No hemotympanum.     Nose: Nose normal.     Right Sinus: No maxillary sinus tenderness or frontal sinus tenderness.     Left Sinus: No maxillary sinus tenderness or frontal sinus tenderness.     Mouth/Throat:     Lips: Pink.     Mouth: Mucous membranes are moist. No injury or lacerations.     Comments: No oral lacerations.  No abnormal or fractured dentition. Eyes:     Pupils: Pupils are equal, round, and reactive to light.  Neck:     Musculoskeletal: Full passive range of motion without pain, normal range of motion and neck supple.     Trachea: Trachea and phonation normal.      Comments: Tenderness palpation to cervical spine without step-offs, crepitus or edema. Tenderness to bilateral trapezius muscles with spasm.  Cardiovascular:     Rate and Rhythm: Normal rate.      Pulses: Normal pulses.     Heart sounds: Normal heart sounds.  Pulmonary:     Effort: Pulmonary effort is normal. No respiratory distress.     Breath sounds: Normal breath sounds and air entry.  Chest:     Chest wall: No mass, deformity, swelling, tenderness, crepitus or edema.  Abdominal:     General: There is no distension.  Musculoskeletal: Normal range of motion.  Skin:    General: Skin is warm and dry.  Neurological:     General: No focal deficit present.     Mental Status: She is alert.     Cranial Nerves: Cranial nerves are intact.     Sensory: Sensation is intact.     Motor: Motor function is intact.     Coordination: Coordination is intact.     Gait: Gait is intact.     Comments: Mental Status:  Alert, oriented, thought content appropriate. Speech fluent without evidence of aphasia. Able to follow 2 step commands without difficulty.  Cranial Nerves:  II:  Peripheral visual fields grossly normal, pupils equal, round, reactive to light III,IV, VI: ptosis not present, extra-ocular motions intact bilaterally  V,VII: smile symmetric, facial light touch sensation equal VIII: hearing grossly normal bilaterally  IX,X: midline uvula rise  XI: bilateral shoulder shrug equal and strong XII: midline tongue extension  Motor:  5/5 in upper and lower extremities bilaterally including strong and equal grip strength and dorsiflexion/plantar flexion Sensory: Pinprick and light touch normal in all extremities.  Deep Tendon Reflexes: 2+ and symmetric  Cerebellar: normal finger-to-nose with bilateral upper extremities Gait: normal gait and balance CV: distal pulses palpable throughout      ED Treatments / Results  Labs (all labs ordered are listed, but only abnormal results are displayed) Labs Reviewed - No data to display  EKG None  Radiology Ct Head Wo Contrast  Result Date: 10/08/2018 CLINICAL DATA:  Fall, head injury.  Headache posttraumatic EXAM: CT HEAD WITHOUT CONTRAST CT  CERVICAL SPINE WITHOUT CONTRAST TECHNIQUE: Multidetector CT imaging of the head and cervical spine was performed following the standard protocol without intravenous contrast. Multiplanar CT image reconstructions of the cervical spine were also generated. COMPARISON:  CT head 10/17/2016 FINDINGS: CT HEAD FINDINGS Brain: Ventricle size normal. Patchy hypodensity in the cerebral white matter bilaterally appears chronic and unchanged. Small hypodensity right cerebellum unchanged. Negative for acute infarct, hemorrhage, mass. Negative for midline shift. Vascular:  Negative for hyperdense vessel Skull: Negative Sinuses/Orbits: Extensive mucosal edema paranasal sinuses. Polyp in the right posterior nasal passage. Normal orbit. Other: None CT CERVICAL SPINE FINDINGS Alignment: Mild anterolisthesis L4-5 and L5-S1. Skull base and vertebrae: Negative for fracture Soft tissues and spinal canal: Negative Disc levels: Normal disc spaces. No significant spurring or stenosis. Upper chest: Apically emphysema Other: None IMPRESSION: 1. No acute intracranial abnormality. Chronic white matter changes likely due to microvascular ischemia 2. Negative for cervical spine fracture. Electronically Signed   By: Marlan Palauharles  Clark M.D.   On: 10/08/2018 18:21   Ct Cervical Spine Wo Contrast  Result Date: 10/08/2018 CLINICAL DATA:  Fall, head injury.  Headache posttraumatic EXAM: CT HEAD WITHOUT CONTRAST CT CERVICAL SPINE WITHOUT CONTRAST TECHNIQUE: Multidetector CT imaging of the head and cervical spine was performed following the standard protocol without intravenous contrast. Multiplanar CT image reconstructions of the cervical spine were also generated. COMPARISON:  CT head 10/17/2016 FINDINGS: CT HEAD FINDINGS Brain: Ventricle size normal. Patchy hypodensity in the cerebral white matter bilaterally appears chronic and unchanged. Small hypodensity right cerebellum unchanged. Negative for acute infarct, hemorrhage, mass. Negative for midline  shift. Vascular: Negative for hyperdense vessel Skull: Negative Sinuses/Orbits: Extensive mucosal edema paranasal sinuses. Polyp in the right posterior nasal passage. Normal orbit. Other: None CT CERVICAL SPINE FINDINGS Alignment: Mild anterolisthesis L4-5 and L5-S1. Skull base and vertebrae: Negative for fracture Soft tissues and spinal canal: Negative Disc levels: Normal disc spaces. No significant spurring or stenosis. Upper chest: Apically emphysema Other: None IMPRESSION: 1. No acute intracranial abnormality. Chronic white matter changes likely due to microvascular ischemia 2. Negative for cervical spine fracture. Electronically Signed   By: Marlan Palauharles  Clark M.D.   On: 10/08/2018 18:21    Procedures Procedures (including critical care time)  Medications Ordered in ED Medications  morphine 4 MG/ML injection 4 mg (4 mg Intramuscular Given 10/08/18 2006)  hydrOXYzine (ATARAX/VISTARIL) tablet 25 mg (25 mg Oral Given 10/08/18 2014)   Initial Impression / Assessment and Plan / ED Course  I have reviewed the triage vital signs and the nursing notes.  Pertinent labs & imaging results that were available during my care of the patient were reviewed by me and considered in my medical decision making (see chart for details).  48 year old female appears otherwise well presents for evaluation of head injury.  Patient with mechanical fall on second staircase approximately 3 feet off ground 24 hours PTA.  Seen by urgent care today and had laceration sutured as well as tetanus updated.  Given she did have cervical tenderness was sent to ED for evaluation and CT scan.  Denies LOC or anticoagulation.,  Normal neuro exam without deficits.  Normal musculoskeletal exam.  CT head and cervical spine obtained from triage.  No acute findings.  No tenderness to extremities.  Low suspicion for vascular injury as result of the fall.  Tolerating p.o. intake and ambulatory prior to ED visit.  She takes Norco for chronic back  pain.  Discussed with patient to take her home regimen as well as anti-inflammatories, heat and ice.  Patient to follow-up with PCP for reevaluation.  Imaging personally reviewed  Note from urgent care reviewed  Pain management provided.  The patient has been appropriately medically screened and/or stabilized in the ED. I have low suspicion for any other emergent medical condition which would require further screening, evaluation or treatment in the ED or require inpatient management.  Patient is hemodynamically stable and in no acute distress.  Patient able  to ambulate in department prior to ED.  Evaluation does not show acute pathology that would require ongoing or additional emergent interventions while in the emergency department or further inpatient treatment.  I have discussed the diagnosis with the patient and answered all questions.  Pain is been managed while in the emergency department and patient has no further complaints prior to discharge.  Patient is comfortable with plan discussed in room and is stable for discharge at this time.  I have discussed strict return precautions for returning to the emergency department.  Patient was encouraged to follow-up with PCP/specialist refer to at discharge.      Final Clinical Impressions(s) / ED Diagnoses   Final diagnoses:  Fall, subsequent encounter  Acute post-traumatic headache, not intractable  Neck sprain, subsequent encounter    ED Discharge Orders    None       Yexalen Deike A, PA-C 10/08/18 2019    Benjiman Core, MD 10/14/18 1512

## 2018-10-14 DIAGNOSIS — E876 Hypokalemia: Secondary | ICD-10-CM | POA: Diagnosis not present

## 2018-10-14 DIAGNOSIS — M542 Cervicalgia: Secondary | ICD-10-CM | POA: Diagnosis not present

## 2018-10-14 DIAGNOSIS — S0181XA Laceration without foreign body of other part of head, initial encounter: Secondary | ICD-10-CM | POA: Diagnosis not present

## 2018-11-22 DIAGNOSIS — F322 Major depressive disorder, single episode, severe without psychotic features: Secondary | ICD-10-CM | POA: Diagnosis not present

## 2018-11-22 DIAGNOSIS — E876 Hypokalemia: Secondary | ICD-10-CM | POA: Diagnosis not present

## 2018-11-22 DIAGNOSIS — M545 Low back pain: Secondary | ICD-10-CM | POA: Diagnosis not present

## 2018-11-22 DIAGNOSIS — I10 Essential (primary) hypertension: Secondary | ICD-10-CM | POA: Diagnosis not present

## 2018-12-24 ENCOUNTER — Other Ambulatory Visit: Payer: Self-pay

## 2018-12-24 DIAGNOSIS — Z20822 Contact with and (suspected) exposure to covid-19: Secondary | ICD-10-CM

## 2018-12-26 LAB — NOVEL CORONAVIRUS, NAA: SARS-CoV-2, NAA: NOT DETECTED

## 2019-01-21 DIAGNOSIS — M545 Low back pain: Secondary | ICD-10-CM | POA: Diagnosis not present

## 2019-02-07 DIAGNOSIS — Z01419 Encounter for gynecological examination (general) (routine) without abnormal findings: Secondary | ICD-10-CM | POA: Diagnosis not present

## 2019-03-04 ENCOUNTER — Other Ambulatory Visit: Payer: Self-pay | Admitting: Obstetrics & Gynecology

## 2019-03-04 DIAGNOSIS — Z1231 Encounter for screening mammogram for malignant neoplasm of breast: Secondary | ICD-10-CM

## 2019-04-09 ENCOUNTER — Ambulatory Visit: Payer: BC Managed Care – PPO

## 2019-09-18 DIAGNOSIS — F322 Major depressive disorder, single episode, severe without psychotic features: Secondary | ICD-10-CM | POA: Diagnosis not present

## 2019-09-18 DIAGNOSIS — I10 Essential (primary) hypertension: Secondary | ICD-10-CM | POA: Diagnosis not present

## 2019-09-18 DIAGNOSIS — M545 Low back pain: Secondary | ICD-10-CM | POA: Diagnosis not present

## 2019-09-18 DIAGNOSIS — E876 Hypokalemia: Secondary | ICD-10-CM | POA: Diagnosis not present

## 2019-09-18 DIAGNOSIS — Z13220 Encounter for screening for lipoid disorders: Secondary | ICD-10-CM | POA: Diagnosis not present

## 2019-11-06 DIAGNOSIS — H1033 Unspecified acute conjunctivitis, bilateral: Secondary | ICD-10-CM | POA: Diagnosis not present

## 2019-12-23 DIAGNOSIS — R109 Unspecified abdominal pain: Secondary | ICD-10-CM | POA: Diagnosis not present

## 2019-12-23 DIAGNOSIS — R300 Dysuria: Secondary | ICD-10-CM | POA: Diagnosis not present

## 2019-12-25 ENCOUNTER — Other Ambulatory Visit: Payer: Self-pay

## 2019-12-25 DIAGNOSIS — R109 Unspecified abdominal pain: Secondary | ICD-10-CM

## 2019-12-26 ENCOUNTER — Ambulatory Visit
Admission: RE | Admit: 2019-12-26 | Discharge: 2019-12-26 | Disposition: A | Discharge status: Home / Self Care | Payer: BC Managed Care – PPO | Source: Ambulatory Visit | Attending: Family Medicine | Admitting: Family Medicine

## 2019-12-26 DIAGNOSIS — R109 Unspecified abdominal pain: Secondary | ICD-10-CM | POA: Diagnosis not present

## 2019-12-26 DIAGNOSIS — R319 Hematuria, unspecified: Secondary | ICD-10-CM | POA: Diagnosis not present

## 2019-12-26 DIAGNOSIS — R1031 Right lower quadrant pain: Secondary | ICD-10-CM | POA: Diagnosis not present

## 2020-02-11 DIAGNOSIS — K625 Hemorrhage of anus and rectum: Secondary | ICD-10-CM | POA: Diagnosis not present

## 2020-02-11 DIAGNOSIS — R1013 Epigastric pain: Secondary | ICD-10-CM | POA: Diagnosis not present

## 2020-02-11 DIAGNOSIS — R110 Nausea: Secondary | ICD-10-CM | POA: Diagnosis not present

## 2020-02-11 DIAGNOSIS — K639 Disease of intestine, unspecified: Secondary | ICD-10-CM | POA: Diagnosis not present

## 2020-02-19 DIAGNOSIS — Z01812 Encounter for preprocedural laboratory examination: Secondary | ICD-10-CM | POA: Diagnosis not present

## 2020-02-24 DIAGNOSIS — K2270 Barrett's esophagus without dysplasia: Secondary | ICD-10-CM | POA: Diagnosis not present

## 2020-02-24 DIAGNOSIS — R195 Other fecal abnormalities: Secondary | ICD-10-CM | POA: Diagnosis not present

## 2020-02-24 DIAGNOSIS — K229 Disease of esophagus, unspecified: Secondary | ICD-10-CM | POA: Diagnosis not present

## 2020-02-24 DIAGNOSIS — K219 Gastro-esophageal reflux disease without esophagitis: Secondary | ICD-10-CM | POA: Diagnosis not present

## 2020-02-24 DIAGNOSIS — K2930 Chronic superficial gastritis without bleeding: Secondary | ICD-10-CM | POA: Diagnosis not present

## 2020-02-24 DIAGNOSIS — K625 Hemorrhage of anus and rectum: Secondary | ICD-10-CM | POA: Diagnosis not present

## 2020-02-24 DIAGNOSIS — R1013 Epigastric pain: Secondary | ICD-10-CM | POA: Diagnosis not present

## 2020-02-24 DIAGNOSIS — B3781 Candidal esophagitis: Secondary | ICD-10-CM | POA: Diagnosis not present

## 2020-02-24 DIAGNOSIS — R110 Nausea: Secondary | ICD-10-CM | POA: Diagnosis not present

## 2020-03-19 DIAGNOSIS — F322 Major depressive disorder, single episode, severe without psychotic features: Secondary | ICD-10-CM | POA: Diagnosis not present

## 2020-03-19 DIAGNOSIS — E876 Hypokalemia: Secondary | ICD-10-CM | POA: Diagnosis not present

## 2020-03-19 DIAGNOSIS — I10 Essential (primary) hypertension: Secondary | ICD-10-CM | POA: Diagnosis not present

## 2020-03-19 DIAGNOSIS — M545 Low back pain, unspecified: Secondary | ICD-10-CM | POA: Diagnosis not present

## 2020-03-24 DIAGNOSIS — Z800 Family history of malignant neoplasm of digestive organs: Secondary | ICD-10-CM | POA: Diagnosis not present

## 2020-03-24 DIAGNOSIS — B3781 Candidal esophagitis: Secondary | ICD-10-CM | POA: Diagnosis not present

## 2020-03-24 DIAGNOSIS — E876 Hypokalemia: Secondary | ICD-10-CM | POA: Diagnosis not present

## 2020-03-24 DIAGNOSIS — K649 Unspecified hemorrhoids: Secondary | ICD-10-CM | POA: Diagnosis not present

## 2020-03-24 DIAGNOSIS — K5900 Constipation, unspecified: Secondary | ICD-10-CM | POA: Diagnosis not present

## 2020-04-01 DIAGNOSIS — R310 Gross hematuria: Secondary | ICD-10-CM | POA: Diagnosis not present

## 2020-04-01 DIAGNOSIS — Z87442 Personal history of urinary calculi: Secondary | ICD-10-CM | POA: Diagnosis not present

## 2020-04-01 DIAGNOSIS — M545 Low back pain, unspecified: Secondary | ICD-10-CM | POA: Diagnosis not present

## 2020-04-14 DIAGNOSIS — N281 Cyst of kidney, acquired: Secondary | ICD-10-CM | POA: Diagnosis not present

## 2020-04-14 DIAGNOSIS — N200 Calculus of kidney: Secondary | ICD-10-CM | POA: Diagnosis not present

## 2020-04-14 DIAGNOSIS — R310 Gross hematuria: Secondary | ICD-10-CM | POA: Diagnosis not present

## 2020-04-14 DIAGNOSIS — K862 Cyst of pancreas: Secondary | ICD-10-CM | POA: Diagnosis not present

## 2020-04-20 DIAGNOSIS — R310 Gross hematuria: Secondary | ICD-10-CM | POA: Diagnosis not present

## 2020-04-20 DIAGNOSIS — N200 Calculus of kidney: Secondary | ICD-10-CM | POA: Diagnosis not present

## 2020-04-26 DIAGNOSIS — N2889 Other specified disorders of kidney and ureter: Secondary | ICD-10-CM | POA: Diagnosis not present

## 2020-04-26 DIAGNOSIS — N200 Calculus of kidney: Secondary | ICD-10-CM | POA: Diagnosis not present

## 2020-05-04 DIAGNOSIS — N200 Calculus of kidney: Secondary | ICD-10-CM | POA: Diagnosis not present

## 2020-06-15 ENCOUNTER — Inpatient Hospital Stay (HOSPITAL_COMMUNITY)
Admission: EM | Admit: 2020-06-15 | Discharge: 2020-06-18 | DRG: 439 | Disposition: A | Discharge status: Home / Self Care | Payer: BC Managed Care – PPO | Attending: Internal Medicine | Admitting: Internal Medicine

## 2020-06-15 ENCOUNTER — Emergency Department (HOSPITAL_COMMUNITY): Payer: BC Managed Care – PPO

## 2020-06-15 ENCOUNTER — Other Ambulatory Visit: Payer: Self-pay

## 2020-06-15 ENCOUNTER — Encounter (HOSPITAL_COMMUNITY): Payer: Self-pay

## 2020-06-15 DIAGNOSIS — F1210 Cannabis abuse, uncomplicated: Secondary | ICD-10-CM | POA: Diagnosis present

## 2020-06-15 DIAGNOSIS — Z885 Allergy status to narcotic agent status: Secondary | ICD-10-CM

## 2020-06-15 DIAGNOSIS — K852 Alcohol induced acute pancreatitis without necrosis or infection: Principal | ICD-10-CM | POA: Diagnosis present

## 2020-06-15 DIAGNOSIS — R1084 Generalized abdominal pain: Secondary | ICD-10-CM | POA: Diagnosis not present

## 2020-06-15 DIAGNOSIS — J449 Chronic obstructive pulmonary disease, unspecified: Secondary | ICD-10-CM | POA: Diagnosis present

## 2020-06-15 DIAGNOSIS — Z7951 Long term (current) use of inhaled steroids: Secondary | ICD-10-CM

## 2020-06-15 DIAGNOSIS — R000 Tachycardia, unspecified: Secondary | ICD-10-CM | POA: Diagnosis not present

## 2020-06-15 DIAGNOSIS — I491 Atrial premature depolarization: Secondary | ICD-10-CM | POA: Diagnosis not present

## 2020-06-15 DIAGNOSIS — K859 Acute pancreatitis without necrosis or infection, unspecified: Secondary | ICD-10-CM | POA: Diagnosis present

## 2020-06-15 DIAGNOSIS — Z886 Allergy status to analgesic agent status: Secondary | ICD-10-CM

## 2020-06-15 DIAGNOSIS — Z20822 Contact with and (suspected) exposure to covid-19: Secondary | ICD-10-CM | POA: Diagnosis present

## 2020-06-15 DIAGNOSIS — F17210 Nicotine dependence, cigarettes, uncomplicated: Secondary | ICD-10-CM | POA: Diagnosis present

## 2020-06-15 DIAGNOSIS — R0789 Other chest pain: Secondary | ICD-10-CM | POA: Diagnosis not present

## 2020-06-15 DIAGNOSIS — E876 Hypokalemia: Secondary | ICD-10-CM | POA: Diagnosis present

## 2020-06-15 DIAGNOSIS — F17200 Nicotine dependence, unspecified, uncomplicated: Secondary | ICD-10-CM | POA: Diagnosis present

## 2020-06-15 DIAGNOSIS — F32A Depression, unspecified: Secondary | ICD-10-CM | POA: Diagnosis present

## 2020-06-15 DIAGNOSIS — I10 Essential (primary) hypertension: Secondary | ICD-10-CM | POA: Diagnosis present

## 2020-06-15 DIAGNOSIS — Z87442 Personal history of urinary calculi: Secondary | ICD-10-CM | POA: Insufficient documentation

## 2020-06-15 DIAGNOSIS — I499 Cardiac arrhythmia, unspecified: Secondary | ICD-10-CM | POA: Diagnosis not present

## 2020-06-15 DIAGNOSIS — R1013 Epigastric pain: Secondary | ICD-10-CM | POA: Diagnosis not present

## 2020-06-15 DIAGNOSIS — T7840XA Allergy, unspecified, initial encounter: Secondary | ICD-10-CM | POA: Insufficient documentation

## 2020-06-15 DIAGNOSIS — F10230 Alcohol dependence with withdrawal, uncomplicated: Secondary | ICD-10-CM | POA: Diagnosis present

## 2020-06-15 DIAGNOSIS — R109 Unspecified abdominal pain: Secondary | ICD-10-CM | POA: Diagnosis not present

## 2020-06-15 DIAGNOSIS — Z981 Arthrodesis status: Secondary | ICD-10-CM

## 2020-06-15 DIAGNOSIS — Z79899 Other long term (current) drug therapy: Secondary | ICD-10-CM

## 2020-06-15 DIAGNOSIS — R1110 Vomiting, unspecified: Secondary | ICD-10-CM | POA: Diagnosis not present

## 2020-06-15 HISTORY — DX: Allergy, unspecified, initial encounter: T78.40XA

## 2020-06-15 HISTORY — DX: Nicotine dependence, unspecified, uncomplicated: F17.200

## 2020-06-15 HISTORY — DX: Alcohol induced acute pancreatitis without necrosis or infection: K85.20

## 2020-06-15 HISTORY — DX: Alcohol dependence with withdrawal, uncomplicated: F10.230

## 2020-06-15 HISTORY — DX: Cannabis abuse, uncomplicated: F12.10

## 2020-06-15 LAB — BASIC METABOLIC PANEL
Anion gap: 15 (ref 5–15)
Anion gap: 12 (ref 5–15)
BUN: 7 mg/dL (ref 6–20)
BUN: 5 mg/dL — ABNORMAL LOW (ref 6–20)
CO2: 23 mmol/L (ref 22–32)
CO2: 25 mmol/L (ref 22–32)
Calcium: 9.2 mg/dL (ref 8.9–10.3)
Calcium: 8.5 mg/dL — ABNORMAL LOW (ref 8.9–10.3)
Chloride: 94 mmol/L — ABNORMAL LOW (ref 98–111)
Chloride: 96 mmol/L — ABNORMAL LOW (ref 98–111)
Creatinine, Ser: 0.68 mg/dL (ref 0.44–1.00)
Creatinine, Ser: 0.59 mg/dL (ref 0.44–1.00)
GFR, Estimated: >60 mL/min (ref >60)
GFR, Estimated: >60 mL/min (ref >60)
Glucose, Bld: 146 mg/dL — ABNORMAL HIGH (ref 70–99)
Glucose, Bld: 135 mg/dL — ABNORMAL HIGH (ref 70–99)
Potassium: 2.2 mmol/L — CL (ref 3.5–5.1)
Potassium: 2.4 mmol/L — CL (ref 3.5–5.1)
Sodium: 132 mmol/L — ABNORMAL LOW (ref 135–145)
Sodium: 133 mmol/L — ABNORMAL LOW (ref 135–145)

## 2020-06-15 LAB — MAGNESIUM: Magnesium: 1.5 mg/dL — ABNORMAL LOW (ref 1.7–2.4)

## 2020-06-15 LAB — RAPID URINE DRUG SCREEN, HOSP PERFORMED
Amphetamines: NOT DETECTED
Barbiturates: NOT DETECTED
Benzodiazepines: POSITIVE — AB
Cocaine: NOT DETECTED
Opiates: POSITIVE — AB
Tetrahydrocannabinol: POSITIVE — AB

## 2020-06-15 LAB — CBC
HCT: 42.6 % (ref 36.0–46.0)
Hemoglobin: 15.3 g/dL — ABNORMAL HIGH (ref 12.0–15.0)
MCH: 34 pg (ref 26.0–34.0)
MCHC: 35.9 g/dL (ref 30.0–36.0)
MCV: 94.7 fL (ref 80.0–100.0)
Platelets: 262 10*3/uL (ref 150–400)
RBC: 4.5 MIL/uL (ref 3.87–5.11)
RDW: 13.2 % (ref 11.5–15.5)
WBC: 11.2 10*3/uL — ABNORMAL HIGH (ref 4.0–10.5)
nRBC: 0 % (ref 0.0–0.2)

## 2020-06-15 LAB — TROPONIN I (HIGH SENSITIVITY)
Troponin I (High Sensitivity): 14 ng/L (ref <18)
Troponin I (High Sensitivity): 26 ng/L — ABNORMAL HIGH (ref <18)

## 2020-06-15 LAB — RESP PANEL BY RT-PCR (FLU A&B, COVID) ARPGX2
Influenza A by PCR: NEGATIVE
Influenza B by PCR: NEGATIVE
SARS Coronavirus 2 by RT PCR: NEGATIVE

## 2020-06-15 LAB — HEPATIC FUNCTION PANEL
ALT: 23 U/L (ref 0–44)
AST: 44 U/L — ABNORMAL HIGH (ref 15–41)
Albumin: 3.7 g/dL (ref 3.5–5.0)
Alkaline Phosphatase: 89 U/L (ref 38–126)
Bilirubin, Direct: 0.4 mg/dL — ABNORMAL HIGH (ref 0.0–0.2)
Indirect Bilirubin: 1.5 mg/dL — ABNORMAL HIGH (ref 0.3–0.9)
Total Bilirubin: 1.9 mg/dL — ABNORMAL HIGH (ref 0.3–1.2)
Total Protein: 6.9 g/dL (ref 6.5–8.1)

## 2020-06-15 LAB — LIPASE, BLOOD: Lipase: 825 U/L — ABNORMAL HIGH (ref 11–51)

## 2020-06-15 LAB — HIV ANTIBODY (ROUTINE TESTING W REFLEX): HIV Screen 4th Generation wRfx: NONREACTIVE

## 2020-06-15 MED ORDER — ADULT MULTIVITAMIN W/MINERALS CH
1.0000 | ORAL_TABLET | Freq: Every day | ORAL | Status: DC
  Administered 2020-06-15 – 2020-06-18 (×4): 1 via ORAL
  Filled 2020-06-15 (×4): qty 1

## 2020-06-15 MED ORDER — FOLIC ACID 1 MG PO TABS
1.0000 mg | ORAL_TABLET | Freq: Every day | ORAL | Status: DC
  Administered 2020-06-15 – 2020-06-18 (×4): 1 mg via ORAL
  Filled 2020-06-15 (×4): qty 1

## 2020-06-15 MED ORDER — ACETAMINOPHEN 650 MG RE SUPP: 650.0000 mg | Freq: Four times a day (QID) | RECTAL | Status: DC | PRN

## 2020-06-15 MED ORDER — ONDANSETRON HCL 4 MG/2ML IJ SOLN
4.0000 mg | Freq: Four times a day (QID) | INTRAMUSCULAR | Status: DC | PRN
  Administered 2020-06-15 – 2020-06-17 (×3): 4 mg via INTRAVENOUS
  Filled 2020-06-15 (×3): qty 2

## 2020-06-15 MED ORDER — FENTANYL CITRATE (PF) 100 MCG/2ML IJ SOLN: 50.0000 ug | Freq: Once | INTRAMUSCULAR | Status: DC

## 2020-06-15 MED ORDER — HYDRALAZINE HCL 20 MG/ML IJ SOLN
5.0000 mg | INTRAMUSCULAR | Status: DC | PRN
  Administered 2020-06-15 (×2): 5 mg via INTRAVENOUS
  Filled 2020-06-15 (×2): qty 1

## 2020-06-15 MED ORDER — DULOXETINE HCL 60 MG PO CPEP
60.0000 mg | ORAL_CAPSULE | Freq: Every day | ORAL | Status: DC
  Administered 2020-06-15 – 2020-06-18 (×4): 60 mg via ORAL
  Filled 2020-06-15 (×4): qty 1

## 2020-06-15 MED ORDER — ENOXAPARIN SODIUM 40 MG/0.4ML IJ SOSY
40.0000 mg | PREFILLED_SYRINGE | INTRAMUSCULAR | Status: DC
  Administered 2020-06-15 – 2020-06-17 (×3): 40 mg via SUBCUTANEOUS
  Filled 2020-06-15 (×3): qty 0.4

## 2020-06-15 MED ORDER — ALBUTEROL SULFATE (2.5 MG/3ML) 0.083% IN NEBU
2.5000 mg | INHALATION_SOLUTION | Freq: Four times a day (QID) | RESPIRATORY_TRACT | Status: DC | PRN
  Administered 2020-06-16: 2.5 mg via RESPIRATORY_TRACT
  Filled 2020-06-15: qty 3

## 2020-06-15 MED ORDER — LACTATED RINGERS IV SOLN: INTRAVENOUS | Status: DC

## 2020-06-15 MED ORDER — LORAZEPAM 2 MG/ML IJ SOLN
1.0000 mg | INTRAMUSCULAR | Status: DC | PRN
  Administered 2020-06-15: 2 mg via INTRAVENOUS
  Administered 2020-06-16: 1 mg via INTRAVENOUS
  Filled 2020-06-15 (×2): qty 1

## 2020-06-15 MED ORDER — POTASSIUM CHLORIDE 2 MEQ/ML IV SOLN: INTRAVENOUS | Status: DC

## 2020-06-15 MED ORDER — SODIUM CHLORIDE 0.9 % IV BOLUS
500.0000 mL | Freq: Once | INTRAVENOUS | Status: AC
  Administered 2020-06-15: 500 mL via INTRAVENOUS

## 2020-06-15 MED ORDER — THIAMINE HCL 100 MG PO TABS
100.0000 mg | ORAL_TABLET | Freq: Every day | ORAL | Status: DC
  Administered 2020-06-15 – 2020-06-18 (×4): 100 mg via ORAL
  Filled 2020-06-15 (×4): qty 1

## 2020-06-15 MED ORDER — PANTOPRAZOLE SODIUM 40 MG IV SOLR
40.0000 mg | Freq: Once | INTRAVENOUS | Status: AC
  Administered 2020-06-15: 40 mg via INTRAVENOUS
  Filled 2020-06-15: qty 40

## 2020-06-15 MED ORDER — ACETAMINOPHEN 325 MG PO TABS
650.0000 mg | ORAL_TABLET | Freq: Four times a day (QID) | ORAL | Status: DC | PRN
  Administered 2020-06-17: 650 mg via ORAL
  Filled 2020-06-15: qty 2

## 2020-06-15 MED ORDER — FENTANYL CITRATE (PF) 100 MCG/2ML IJ SOLN
100.0000 ug | Freq: Once | INTRAMUSCULAR | Status: AC
  Administered 2020-06-15: 100 ug via INTRAVENOUS
  Filled 2020-06-15: qty 2

## 2020-06-15 MED ORDER — POTASSIUM CHLORIDE IN NACL 40-0.9 MEQ/L-% IV SOLN
INTRAVENOUS | Status: DC
  Filled 2020-06-15 (×8): qty 1000

## 2020-06-15 MED ORDER — POTASSIUM CHLORIDE 10 MEQ/100ML IV SOLN
10.0000 meq | Freq: Once | INTRAVENOUS | Status: AC
  Administered 2020-06-15: 10 meq via INTRAVENOUS
  Filled 2020-06-15: qty 100

## 2020-06-15 MED ORDER — LORATADINE 10 MG PO TABS
10.0000 mg | ORAL_TABLET | Freq: Every day | ORAL | Status: DC
  Administered 2020-06-16 – 2020-06-18 (×3): 10 mg via ORAL
  Filled 2020-06-15 (×3): qty 1

## 2020-06-15 MED ORDER — ONDANSETRON HCL 4 MG PO TABS: 4.0000 mg | ORAL_TABLET | Freq: Four times a day (QID) | ORAL | Status: DC | PRN

## 2020-06-15 MED ORDER — LORAZEPAM 2 MG/ML IJ SOLN
0.0000 mg | Freq: Four times a day (QID) | INTRAMUSCULAR | Status: DC
  Administered 2020-06-15 – 2020-06-16 (×4): 1 mg via INTRAVENOUS
  Filled 2020-06-15 (×4): qty 1

## 2020-06-15 MED ORDER — NITROGLYCERIN 0.4 MG SL SUBL: 0.4000 mg | SUBLINGUAL_TABLET | SUBLINGUAL | Status: DC | PRN

## 2020-06-15 MED ORDER — NICOTINE 21 MG/24HR TD PT24
21.0000 mg | MEDICATED_PATCH | TRANSDERMAL | Status: DC
  Filled 2020-06-15: qty 1

## 2020-06-15 MED ORDER — FENTANYL CITRATE (PF) 100 MCG/2ML IJ SOLN
50.0000 ug | Freq: Once | INTRAMUSCULAR | Status: AC
  Administered 2020-06-15: 50 ug via INTRAVENOUS
  Filled 2020-06-15: qty 2

## 2020-06-15 MED ORDER — AMLODIPINE BESYLATE 10 MG PO TABS
10.0000 mg | ORAL_TABLET | Freq: Every day | ORAL | Status: DC
  Administered 2020-06-15 – 2020-06-18 (×4): 10 mg via ORAL
  Filled 2020-06-15 (×2): qty 1
  Filled 2020-06-15: qty 2
  Filled 2020-06-15: qty 1

## 2020-06-15 MED ORDER — MAGNESIUM SULFATE 2 GM/50ML IV SOLN
2.0000 g | Freq: Once | INTRAVENOUS | Status: AC
  Administered 2020-06-15: 2 g via INTRAVENOUS
  Filled 2020-06-15: qty 50

## 2020-06-15 MED ORDER — LORAZEPAM 2 MG/ML IJ SOLN: 0.0000 mg | Freq: Two times a day (BID) | INTRAMUSCULAR | Status: DC

## 2020-06-15 MED ORDER — MONTELUKAST SODIUM 10 MG PO TABS
10.0000 mg | ORAL_TABLET | Freq: Every morning | ORAL | Status: DC
  Administered 2020-06-15 – 2020-06-18 (×4): 10 mg via ORAL
  Filled 2020-06-15 (×3): qty 1

## 2020-06-15 MED ORDER — LORAZEPAM 1 MG PO TABS
1.0000 mg | ORAL_TABLET | ORAL | Status: DC | PRN
Start: 2020-06-15 — End: 2020-06-18

## 2020-06-15 MED ORDER — MORPHINE SULFATE (PF) 2 MG/ML IV SOLN
2.0000 mg | INTRAVENOUS | Status: DC | PRN
  Administered 2020-06-15 – 2020-06-16 (×5): 2 mg via INTRAVENOUS
  Filled 2020-06-15 (×5): qty 1

## 2020-06-15 MED ORDER — SODIUM CHLORIDE 0.9% FLUSH
3.0000 mL | Freq: Two times a day (BID) | INTRAVENOUS | Status: DC
  Administered 2020-06-15 – 2020-06-17 (×5): 3 mL via INTRAVENOUS

## 2020-06-15 MED ORDER — FLUTICASONE FUROATE-VILANTEROL 200-25 MCG/INH IN AEPB
1.0000 | INHALATION_SPRAY | Freq: Every day | RESPIRATORY_TRACT | Status: DC
  Administered 2020-06-17: 1 via RESPIRATORY_TRACT
  Filled 2020-06-15: qty 28

## 2020-06-15 MED ORDER — THIAMINE HCL 100 MG/ML IJ SOLN
100.0000 mg | Freq: Every day | INTRAMUSCULAR | Status: DC
  Filled 2020-06-15: qty 2

## 2020-06-15 MED ORDER — HYDROXYZINE HCL 10 MG PO TABS
10.0000 mg | ORAL_TABLET | Freq: Once | ORAL | Status: AC
  Administered 2020-06-15: 10 mg via ORAL
  Filled 2020-06-15: qty 1

## 2020-06-15 NOTE — Progress Notes (Signed)
   06/15/20 1410  Assess: MEWS Score  Temp 98 F (36.7 C)  BP (!) 176/106  Pulse Rate (!) 116  Resp 20  Level of Consciousness Alert  Assess: MEWS Score  MEWS Temp 0  MEWS Systolic 0  MEWS Pulse 2  MEWS RR 0  MEWS LOC 0  MEWS Score 2  MEWS Score Color Yellow  Assess: if the MEWS score is Yellow or Red  Were vital signs taken at a resting state? Yes  Focused Assessment No change from prior assessment  Early Detection of Sepsis Score *See Row Information* Low  MEWS guidelines implemented *See Row Information* Yes  Treat  MEWS Interventions Administered prn meds/treatments  Pain Scale 0-10  Pain Score 4  Pain Type Acute pain  Pain Location Abdomen  Pain Intervention(s) Emotional support  Take Vital Signs  Increase Vital Sign Frequency  Yellow: Q 2hr X 2 then Q 4hr X 2, if remains yellow, continue Q 4hrs  Escalate  MEWS: Escalate Yellow: discuss with charge nurse/RN and consider discussing with provider and RRT  Notify: Charge Nurse/RN  Name of Charge Nurse/RN Notified Windell Moulding  Date Charge Nurse/RN Notified 06/15/20  Time Charge Nurse/RN Notified 1412  Notify: Provider  Provider Name/Title Dr. Ophelia Charter  Date Provider Notified 06/15/20  Time Provider Notified 1414  Notification Type Page  Notification Reason Change in status  Provider response See new orders  Date of Provider Response 06/15/20  Time of Provider Response 1414  Document  Patient Outcome Stabilized after interventions

## 2020-06-15 NOTE — Progress Notes (Addendum)
Dr. Ophelia Charter notified of patient's critical potassium level of 2.4. Will continue to give potassium through maintenance IV fluids.

## 2020-06-15 NOTE — ED Triage Notes (Addendum)
Pt BIB EMS from home due to cp,epigastric that started Sunday. En route pt had PVCs per EMS. Pt reports also having episode of vomiting.Pt was orthostatic + with EMS. Pt was given zofran and 500 bolus fluid en route.

## 2020-06-15 NOTE — Progress Notes (Signed)
Patient arrived to 23C20. Patient oriented to room and call bell in reach. Husband Tim notified that patient has arrived to unit.

## 2020-06-15 NOTE — H&P (Signed)
History and Physical    Suzanne Jackson:[id number] DOB: 10/06/1970 DOA: 06/15/2020  PCP: Clovis Riley, L.August Saucer, MD Consultants:  Buccini - GI Patient coming from:  Home - lives with husband and son; Suzanne Jackson: Husband, [phone]  Chief Complaint:  CP  HPI: Suzanne Jackson is a 50 y.o. female with medical history significant of HTN and depression presenting with epigastric pain.  For the past few days, she has been unable to keep anything down and vomiting.  This AM, she woke up about 4AM with severe epigastric pain.  She has had pancreatitis once before, maybe 6-7 years ago.    Vomiting started Sunday and yesterday.  She started drinking Friday and Saturday - drank "a lot" of liquor, "way too much."  Maybe 2 pints by herself.  Her prior episode was likely associated with alcohol.    ED Course:   Drank too much ETOH, started with n/v -> epigastric/chest pain.  Lipase >800, K+ 2.2.  ?Mild ST depression on EKG, normal troponin.  Low suspicion for ACS, more concern for pancreatitis.  Review of Systems: As per HPI; otherwise review of systems reviewed and negative.   Ambulatory Status:  Ambulates without assistance  COVID Vaccine Status:  Complete  Past Medical History:  Diagnosis Date  . Alcohol dependence with uncomplicated withdrawal (HCC) 06/15/2020  . Alcoholic pancreatitis   . Allergies   . Arthritis    BACK  . Asthma   . Depression   . Frequency of urination   . History of kidney stones   . Hypertension   . Leg pain    Left  . Marijuana abuse 06/15/2020  . Microhematuria   . Right ureteral stone   . Tobacco dependence 06/15/2020  . Wears contact lenses     Past Surgical History:  Procedure Laterality Date  . ANTERIOR LAT LUMBAR FUSION N/A 08/29/2017   Procedure: LUMBAR FOUR-FIVE LATERAL INTERBODY FUSION WITH INSTRUMENTATION AND ALLOGRAFT;  Surgeon: Estill Bamberg, MD;  Location: [facility];  Service: Orthopedics;  Laterality: N/A;  . CYSTO/  URETEROSCOPIC STONE EXTRACTION  1998   . CYSTOSCOPY WITH RETROGRADE PYELOGRAM, URETEROSCOPY AND STENT PLACEMENT Right 02/12/2015   Procedure: CYSTOSCOPY WITH RETROGRADE PYELOGRAM, URETEROSCOPY;  Surgeon: Jethro Bolus, MD;  Location: [facility];  Service: Urology;  Laterality: Right;  . DILITATION & CURRETTAGE/HYSTROSCOPY WITH NOVASURE ABLATION N/A 12/05/2017   Procedure: DILATATION & CURETTAGE/HYSTEROSCOPY WITH NOVASURE ABLATION;  Surgeon: Myna Hidalgo, DO;  Location: [facility];  Service: Gynecology;  Laterality: N/A;  . EXTRACORPOREAL SHOCK WAVE LITHOTRIPSY Right 08/08/2018   Procedure: EXTRACORPOREAL SHOCK WAVE LITHOTRIPSY (ESWL);  Surgeon: Heloise Purpura, MD;  Location: [facility];  Service: Urology;  Laterality: Right;  . LAPAROSCOPIC CHOLECYSTECTOMY  1994  . LAPAROSCOPIC TUBAL LIGATION Bilateral 05-06-2009   fulgeration  . POSTERIOR FUSION LUMBAR SPINE  10-28-2008   L5 - S1  . STONE EXTRACTION WITH BASKET Right 02/12/2015   Procedure: STONE EXTRACTION WITH BASKET AND LASER LITHOTRIPSY;  Surgeon: Jethro Bolus, MD;  Location: [facility];  Service: Urology;  Laterality: Right;  . TUBAL LIGATION      Social History   Socioeconomic History  . Marital status: Married    Spouse name: Not on file  . Number of children: Not on file  . Years of education: Not on file  . Highest education level: Not on file  Occupational History  . 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 Smoking status: Current Every Day Smoker    Packs/day: 2.00  Years: 25.00    Pack years: 50.00    Types: Cigarettes  . Smokeless tobacco: Never Used  Vaping Use  . Vaping Use: Never used  Substance and Sexual Activity  . Alcohol use: Yes    Comment: binge drinking; recently drinking 5-7 days a week, heavily; + tremors with withdrawal  . Drug use: Yes    Types: Marijuana    Comment: regular use  . Sexual activity: Not on file  Other Topics Concern  . Not on file  Social History Narrative  .  Not on file   Social Determinants of Health   Financial Resource Strain: Not on file  Food Insecurity: Not on file  Transportation Needs: Not on file  Physical Activity: Not on file  Stress: Not on file  Social Connections: Not on file  Intimate Partner Violence: Not on file    Allergies  Allergen Reactions  . Aspirin Anaphylaxis  . Hydromorphone Itching  . Ibuprofen Anaphylaxis  . Percocet [Oxycodone-Acetaminophen] Itching  . Aleve [Naproxen]     Other reaction(s): wheeze  . Bupropion     Other reaction(s): anger    Family History  Problem Relation Age of Onset  . Pancreatitis Neg Hx     Prior to Admission medications   Medication Sig Start Date End Date Taking? Authorizing Provider  acetaminophen (TYLENOL) 500 MG tablet Take 1,000 mg by mouth every 6 (six) hours as needed for moderate pain or headache.   Yes [provider]  amLODipine (NORVASC) 10 MG tablet Take 10 mg by mouth daily.  10/11/16  Yes [provider]  budesonide-formoterol (SYMBICORT) 160-4.5 MCG/ACT inhaler Inhale 1 puff into the lungs 2 (two) times daily.   Yes [provider]  DULoxetine (CYMBALTA) 60 MG capsule Take 60 mg by mouth daily. 09/18/19  Yes [provider]  fexofenadine (ALLEGRA) 180 MG tablet Take 180 mg by mouth daily.   Yes [provider]  hydrochlorothiazide (MICROZIDE) 12.5 MG capsule Take 12.5 mg by mouth daily. 10/11/16  Yes [provider]  HYDROcodone-acetaminophen (NORCO) 7.5-325 MG tablet Take 1 tablet by mouth every 6 (six) hours as needed for moderate pain.   Yes [provider]  hydrOXYzine (ATARAX/VISTARIL) 50 MG tablet Take 1 tablet (50 mg total) by mouth every 6 (six) hours as needed (refractory itching). 08/31/17  Yes McKenzie, Kayla J, PA-C  montelukast (SINGULAIR) 10 MG tablet Take 10 mg by mouth every morning.  11/21/12  Yes [provider]  potassium chloride SA (K-DUR,KLOR-CON) 20 MEQ tablet Take 20 mEq by  mouth daily.   Yes [provider]  PROAIR HFA 108 (90 BASE) MCG/ACT inhaler Inhale 1-2 puffs into the lungs every 6 (six) hours as needed for wheezing or shortness of breath.  11/21/12  Yes [provider]    Physical Exam: Vitals:   06/15/20 1115 06/15/20 1142 06/15/20 1145 06/15/20 1215  BP: (!) 184/104 (!) 180/103 (!) 186/100 (!) 179/98  Pulse: (!) 104 (!) 110 92 97  Resp: (!) 21  13 (!) 25  Temp:      TempSrc:      SpO2: 95%  98% 94%     . General:  Appears calm and comfortable and is in NAD, appears older than stated age . Eyes:  PERRL, EOMI, normal lids, iris . ENT:  grossly normal hearing, lips & tongue, mildly dry mm; appropriate dentition . Neck:  no LAD, masses or thyromegaly . Cardiovascular:  RR with mild tachycardia, no m/r/g. No LE  edema.  . Respiratory:   CTA bilaterally with no wheezes/rales/rhonchi.  Mildly increased respiratory effort. . Abdomen:  soft, moderately TTP particularly in RUQ < epigastric region, ND, hypoactive but present BS . Skin:  no rash or induration seen on limited exam . Musculoskeletal:  grossly normal tone BUE/BLE, good ROM, no bony abnormality . Psychiatric:  grossly normal mood and affect, speech fluent and appropriate, AOx3 . Neurologic:  CN 2-12 grossly intact, moves all extremities in coordinated fashion    Radiological Exams on Admission: Independently reviewed - see discussion in A/P where applicable  DG Abdomen Acute W/Chest  Result Date: 06/15/2020 CLINICAL DATA:  51 year old female with chest and epigastric pain for 2 days. Episode of vomiting. EXAM: DG ABDOMEN ACUTE WITH 1 VIEW CHEST COMPARISON:  CT Abdomen and Pelvis 04/14/2020, chest radiographs 02/28/2017. FINDINGS: Lung volumes and mediastinal contours are stable and within normal limits. Visualized tracheal air column is within normal limits. No pneumothorax or pneumoperitoneum. Lungs remain clear. Upright and supine views of the abdomen and pelvis. Stable  cholecystectomy clips. Chronic lumbar fusion hardware. Non obstructed bowel gas pattern. Abdominal visceral contours are stable and within normal limits. No acute osseous abnormality identified. IMPRESSION: 1. Normal bowel gas pattern, no free air. 2. Negative chest. Electronically Signed   By: Odessa Fleming M.D.   On: 06/15/2020 07:23    EKG: Independently reviewed.  NSR with rate 97; LVH; ST depression noted  Labs on Admission: I have personally reviewed the available labs and imaging studies at the time of the admission.  Pertinent labs:   Na++ 132 K+ 2.2 Glucose 146 Lipase 825 AST 44/ALT 23/Bili 1.9 HS troponin 14 WBC 11.2   Assessment/Plan Principal Problem:   Alcoholic pancreatitis Active Problems:   Hypokalemia   Hypertension   Depression   Alcohol dependence with uncomplicated withdrawal (HCC)   Tobacco dependence   Marijuana abuse   Alcoholic pancreatitis -Patient with prior h/o acute alcoholic pancreatitis x1 presenting with similar symptoms, occurring after a weekend of heavy drinking -Now with mild pancreatitis by H&P, mildly elevated lipase -Will observe for now -Strict NPO for now -Aggressive IVF hydration at least for the first 12 hours with IVF at 200 cc/hr -Pain control with morphine 2 mg q2h prn.   -Nausea control with Zofran  Hypokalemia -Repleted in ER with KCl, likely leading to a rise up to about 2.4. -Will give 40 mEq/L in IVF at 200cc/hr, so 40 mEq every 5 hours -Will recheck BMP at 1600 and adjust accordingly -Will recheck BMP again at 0500    -Will also replete Mag (1.5) -Hold HCTZ and consider stopping -Will monitor on telemetry given severity of hypokalemia  ETOH dependence -Patient with chronic ETOH dependence -She is at high risk for complications of withdrawal including seizures, DTs -CIWA protocol -Folate, thiamine, and MVI ordered -Will provide symptom-triggered BZD (ativan per CIWA protocol)  -TOC team consult for substance abuse  education -Consider offering a medication for Alcohol Use Disorder at the time of d/c, to include Disulfuram; Naltrexone; or Acamprosate.  HTN -Continue Norvasc -Stop HCTZ for now and consider discontinuation given severe hypokalemia -Will cover with PRN IV hydralazine  Depression -Continue Cymbalta  COPD with ongoing Tobacco dependence -Continue Symbicort (Breo formulary substitution), Allegra (Claritin formulary substitution), Singulair -Tobacco Dependence: encourage cessation.   -This was discussed with the patient and should be reviewed on an ongoing basis.   -Patch ordered at patient request.  Marijuana abuse -Cessation encouraged; this should be encouraged on an ongoing  basis -UDS ordered      Note: This patient has been tested and is negative for the novel coronavirus COVID-19. The patient has been fully vaccinated against COVID-19.   Level of care: Telemetry Medical DVT prophylaxis:  Lovenox Code Status:  Full - confirmed with patient/family Family Communication: Husband was present throughout evaluation Disposition Plan:  The patient is from: home  Anticipated d/c is to: home without [facility] services  Anticipated d/c date will depend on clinical response to treatment, but possibly as early as tomorrow if she has excellent response to treatment  Patient is currently: acutely ill Consults called: TOC team Admission status:  It is my clinical opinion that referral for OBSERVATION is reasonable and necessary in this patient based on the above information provided. The aforementioned taken together are felt to place the patient at high risk for further clinical deterioration. However it is anticipated that the patient may be medically stable for discharge from the hospital within 24 to 48 hours.    Jonah Blue MD [facility]   How to contact the [facility] Attending or Consulting provider 7A - 7P or covering provider during after hours 7P -7A, for this patient?   1. Check the care team in [facility] and look for a) attending/consulting TR[facility] provider listed and b) the [facility] team listed 2. Log into [email or web address] and use [place]'s universal password to access. If you do not have the password, please contact the hospital operator. 3. Locate the [facility] provider you are looking for under [facility] and page to a number that you can be directly reached. 4. If you still have difficulty reaching the provider, please page the [facility] (Director on Call) for the Hospitalists listed on amion for assistance.   06/15/2020, 1:22 PM

## 2020-06-15 NOTE — ED Provider Notes (Signed)
[facility] EMERGENCY DEPARTMENT Provider Note   CSN: [id number] Arrival date & time: 06/15/20  0631     History Chief Complaint  Patient presents with  . Chest Pain    Suzanne Jackson is a 50 y.o. female history of kidney stones, asthma, smoker, hypertension, arthritis.  Patient reports Sunday she developed nausea and nonbloody/nonbilious emesis, she was out with friends that before, she reports drinking "too much" alcohol.  Nausea and vomiting continued for 2 days.  Around 2 hours prior to arrival patient reports that she developed epigastric pain as a sharp pain constant occasionally rating up into her chest, no clear aggravating or alleviating factors, moderate-severe intensity.  Denies similar pain in the past.  Denies fever/chills, fall/injury, cough/hemoptysis, shortness of breath, diarrhea, dysuria/hematuria, lower abdominal pain, extremity swelling/color change or any additional concerns.  HPI     Past Medical History:  Diagnosis Date  . Arthritis    BACK  . Asthma   . Depression   . Frequency of urination   . History of kidney stones   . Hypertension   . Leg pain    Left  . Microhematuria   . Right ureteral stone   . Wears contact lenses     Patient Active Problem List   Diagnosis Date Noted  . Hypokalemia 06/15/2020  . Radiculopathy 08/29/2017    Past Surgical History:  Procedure Laterality Date  . ANTERIOR LAT LUMBAR FUSION N/A 08/29/2017   Procedure: LUMBAR FOUR-FIVE LATERAL INTERBODY FUSION WITH INSTRUMENTATION AND ALLOGRAFT;  Surgeon: Estill Bamberg, MD;  Location: [facility];  Service: Orthopedics;  Laterality: N/A;  . CYSTO/  URETEROSCOPIC STONE EXTRACTION  1998  . CYSTOSCOPY WITH RETROGRADE PYELOGRAM, URETEROSCOPY AND STENT PLACEMENT Right 02/12/2015   Procedure: CYSTOSCOPY WITH RETROGRADE PYELOGRAM, URETEROSCOPY;  Surgeon: Jethro Bolus, MD;  Location: [facility];  Service: Urology;  Laterality: Right;  .  DILITATION & CURRETTAGE/HYSTROSCOPY WITH NOVASURE ABLATION N/A 12/05/2017   Procedure: DILATATION & CURETTAGE/HYSTEROSCOPY WITH NOVASURE ABLATION;  Surgeon: Myna Hidalgo, DO;  Location: [facility];  Service: Gynecology;  Laterality: N/A;  . EXTRACORPOREAL SHOCK WAVE LITHOTRIPSY Right 08/08/2018   Procedure: EXTRACORPOREAL SHOCK WAVE LITHOTRIPSY (ESWL);  Surgeon: Heloise Purpura, MD;  Location: [facility];  Service: Urology;  Laterality: Right;  . LAPAROSCOPIC CHOLECYSTECTOMY  1994  . LAPAROSCOPIC TUBAL LIGATION Bilateral 05-06-2009   fulgeration  . POSTERIOR FUSION LUMBAR SPINE  10-28-2008   L5 - S1  . STONE EXTRACTION WITH BASKET Right 02/12/2015   Procedure: STONE EXTRACTION WITH BASKET AND LASER LITHOTRIPSY;  Surgeon: Jethro Bolus, MD;  Location: [facility];  Service: Urology;  Laterality: Right;  . TUBAL LIGATION       OB History   No obstetric history on file.     History reviewed. No pertinent family history.  Social History   Tobacco Use  . Smoking status: Current Every Day Smoker    Packs/day: 1.00    Years: 25.00    Pack years: 25.00    Types: Cigarettes  . Smokeless tobacco: Never Used  Vaping Use  . Vaping Use: Never used  Substance Use Topics  . Alcohol use: Yes    Comment: occasional  . Drug use: No    Home Medications Prior to Admission medications   Medication Sig Start Date End Date Taking? Authorizing Provider  acetaminophen (TYLENOL) 500 MG tablet Take 1,000 mg by mouth every 6 (six) hours as needed for moderate pain or headache.   Yes [provider]  amLODipine (NORVASC) 10 MG tablet Take 10 mg by mouth daily.  10/11/16  Yes [provider]  budesonide-formoterol (SYMBICORT) 160-4.5 MCG/ACT inhaler Inhale 1 puff into the lungs 2 (two) times daily.   Yes [provider]  DULoxetine (CYMBALTA) 60 MG capsule Take 60 mg by mouth daily. 09/18/19  Yes [provider]  fexofenadine (ALLEGRA) 180  MG tablet Take 180 mg by mouth daily.   Yes [provider]  hydrochlorothiazide (MICROZIDE) 12.5 MG capsule Take 12.5 mg by mouth daily. 10/11/16  Yes [provider]  HYDROcodone-acetaminophen (NORCO) 7.5-325 MG tablet Take 1 tablet by mouth every 6 (six) hours as needed for moderate pain.   Yes [provider]  hydrOXYzine (ATARAX/VISTARIL) 50 MG tablet Take 1 tablet (50 mg total) by mouth every 6 (six) hours as needed (refractory itching). 08/31/17  Yes McKenzie, Kayla J, PA-C  montelukast (SINGULAIR) 10 MG tablet Take 10 mg by mouth every morning.  11/21/12  Yes [provider]  potassium chloride SA (K-DUR,KLOR-CON) 20 MEQ tablet Take 20 mEq by mouth daily.   Yes [provider]  PROAIR HFA 108 (90 BASE) MCG/ACT inhaler Inhale 1-2 puffs into the lungs every 6 (six) hours as needed for wheezing or shortness of breath.  11/21/12  Yes [provider]    Allergies    Aspirin, Hydromorphone, Ibuprofen, Percocet [oxycodone-acetaminophen], Aleve [naproxen], and Bupropion  Review of Systems   Review of Systems Ten systems are reviewed and are negative for acute change except as noted in the HPI  Physical Exam Updated Vital Signs BP (!) 171/106   Pulse 97   Temp 98.3 F (36.8 C) (Oral)   Resp (!) 23   SpO2 99%   Physical Exam Constitutional:      General: She is not in acute distress.    Appearance: Normal appearance. She is well-developed. She is not ill-appearing or diaphoretic.  HENT:     Head: Normocephalic and atraumatic.  Eyes:     General: Vision grossly intact. Gaze aligned appropriately.     Pupils: Pupils are equal, round, and reactive to light.  Neck:     Trachea: Trachea and phonation normal.  Cardiovascular:     Rate and Rhythm: Normal rate and regular rhythm.     Pulses:          Dorsalis pedis pulses are 2+ on the right side and 2+ on the left side.  Pulmonary:     Effort: Pulmonary effort is normal. No respiratory  distress.  Abdominal:     General: There is no distension.     Palpations: Abdomen is soft.     Tenderness: There is no abdominal tenderness. There is no guarding or rebound.  Musculoskeletal:        General: Normal range of motion.     Cervical back: Normal range of motion.     Right lower leg: No edema.     Left lower leg: No edema.       Feet:  Feet:     Right foot:     Protective Sensation: 3 sites tested. 3 sites sensed.     Left foot:     Protective Sensation: 3 sites tested. 3 sites sensed.     Comments: Scabbing left foot patient reports due to spider bite.  Mild surrounding erythema. Skin:    General: Skin is warm and dry.  Neurological:     Mental Status: She is alert.     GCS:  GCS eye subscore is 4. GCS verbal subscore is 5. GCS motor subscore is 6.     Comments: Speech is clear and goal oriented, follows commands Major Cranial nerves without deficit, no facial droop Moves extremities without ataxia, coordination intact  Psychiatric:        Behavior: Behavior normal.     ED Results / Procedures / Treatments   Labs (all labs ordered are listed, but only abnormal results are displayed) Labs Reviewed  BASIC METABOLIC PANEL - Abnormal; Notable for the following components:      Result Value   Sodium 132 (*)    Potassium 2.2 (*)    Chloride 94 (*)    Glucose, Bld 146 (*)    All other components within normal limits  CBC - Abnormal; Notable for the following components:   WBC 11.2 (*)    Hemoglobin 15.3 (*)    All other components within normal limits  LIPASE, BLOOD - Abnormal; Notable for the following components:   Lipase 825 (*)    All other components within normal limits  HEPATIC FUNCTION PANEL - Abnormal; Notable for the following components:   AST 44 (*)    Total Bilirubin 1.9 (*)    Bilirubin, Direct 0.4 (*)    Indirect Bilirubin 1.5 (*)    All other components within normal limits  RESP PANEL BY RT-PCR (FLU A&B, COVID) ARPGX2  MAGNESIUM  TROPONIN  I (HIGH SENSITIVITY)  TROPONIN I (HIGH SENSITIVITY)    EKG EKG Interpretation  Date/Time:  Tuesday Jun 15 2020 06:36:35 EDT Ventricular Rate:  97 PR Interval:  200 QRS Duration: 104 QT Interval:  393 QTC Calculation: 500 R Axis:   65 Text Interpretation: Sinus rhythm Borderline prolonged PR interval Biatrial enlargement Left ventricular hypertrophy ST depression, consider ischemia, lateral lds Borderline prolonged QT interval Confirmed by Geoffery Lyons ([id number]) on 06/15/2020 6:53:40 AM   Radiology DG Abdomen Acute W/Chest  Result Date: 06/15/2020 CLINICAL DATA:  50 year old female with chest and epigastric pain for 2 days. Episode of vomiting. EXAM: DG ABDOMEN ACUTE WITH 1 VIEW CHEST COMPARISON:  CT Abdomen and Pelvis 04/14/2020, chest radiographs 02/28/2017. FINDINGS: Lung volumes and mediastinal contours are stable and within normal limits. Visualized tracheal air column is within normal limits. No pneumothorax or pneumoperitoneum. Lungs remain clear. Upright and supine views of the abdomen and pelvis. Stable cholecystectomy clips. Chronic lumbar fusion hardware. Non obstructed bowel gas pattern. Abdominal visceral contours are stable and within normal limits. No acute osseous abnormality identified. IMPRESSION: 1. Normal bowel gas pattern, no free air. 2. Negative chest. Electronically Signed   By: Odessa Fleming M.D.   On: 06/15/2020 07:23    Procedures Procedures   Medications Ordered in ED Medications  potassium chloride 10 mEq in 100 mL IVPB (10 mEq Intravenous New Bag/Given 06/15/20 0923)  potassium chloride 10 mEq in 100 mL IVPB (has no administration in time range)  fentaNYL (SUBLIMAZE) injection 100 mcg (has no administration in time range)  sodium chloride 0.9 % bolus 500 mL (0 mLs Intravenous Stopped 06/15/20 0710)  fentaNYL (SUBLIMAZE) injection 50 mcg (50 mcg Intravenous Given 06/15/20 0805)  hydrOXYzine (ATARAX/VISTARIL) tablet 10 mg (10 mg Oral Given 06/15/20 0805)  sodium  chloride 0.9 % bolus 500 mL (500 mLs Intravenous New Bag/Given 06/15/20 4696)    ED Course  I have reviewed the triage vital signs and the nursing notes.  Pertinent labs & imaging results that were available during my care of the patient were reviewed by me  and considered in my medical decision making (see chart for details).    MDM Rules/Calculators/A&P                         Additional history obtained from: 1. Nursing notes from this visit. Review of electronic medical records. ------ 50 year old female history as above presents for 2-3-day history of nausea vomiting and now with epigastric pain radiating to her chest that began around 2 hours prior to arrival.  Will obtain cardiac enzymes, chest x-ray, abdominal x-ray, abdominal pain labs, EKG give fluids and pain medication and monitor. - I ordered, reviewed and interpreted labs which include: CBC shows mild leuks ptosis of 11.2, hemoglobin 15.3. BMP shows hypokalemia at 2.2, sodium 132, creatinine/BUN within normal limits, no gap. Troponin within normal limits. Lipase 825. LFTs show total bilirubin 1.9, otherwise LFTs without significant elevation.  DG Chest/ABD:    IMPRESSION:  1. Normal bowel gas pattern, no free air.  2. Negative chest.   EKG: Sinus rhythm Borderline prolonged PR interval Biatrial enlargement Left ventricular hypertrophy ST depression, consider ischemia, lateral lds Borderline prolonged QT interval Confirmed by Geoffery LyonseLo, Douglas ([id number]) on 06/15/2020 6:53:40 AM - Patient reassessed, vital signs stable no acute distress.  Minimal pain relief after initial fentanyl.  Additional pain medication has been ordered. Protonix added for possible alcoholic gastritis.  Potassium repletion ordered.  Magnesium and COVID test added.  Consult with hospitalist Dr. Ophelia CharterYates, patient was accepted to medicine service. Patient seen and evaluated by Dr. Jodi MourningZavitz during this visit who agrees with work-up and admission at this  time.     Note: Portions of this report may have been transcribed using voice recognition software. Every effort was made to ensure accuracy; however, inadvertent computerized transcription errors may still be present. Final Clinical Impression(s) / ED Diagnoses Final diagnoses:  Alcohol-induced acute pancreatitis, unspecified complication status    Rx / DC Orders ED Discharge Orders    None       Elizabeth PalauMorelli, Davidjames Blansett A, PA-C 06/15/20 [id number]    Blane OharaZavitz, Joshua, MD 06/15/20 1534

## 2020-06-16 DIAGNOSIS — K859 Acute pancreatitis without necrosis or infection, unspecified: Secondary | ICD-10-CM | POA: Diagnosis present

## 2020-06-16 LAB — COMPREHENSIVE METABOLIC PANEL
ALT: 15 U/L (ref 0–44)
AST: 27 U/L (ref 15–41)
Albumin: 2.9 g/dL — ABNORMAL LOW (ref 3.5–5.0)
Alkaline Phosphatase: 78 U/L (ref 38–126)
Anion gap: 10 (ref 5–15)
BUN: <5 mg/dL — ABNORMAL LOW (ref 6–20)
CO2: 23 mmol/L (ref 22–32)
Calcium: 8.3 mg/dL — ABNORMAL LOW (ref 8.9–10.3)
Chloride: 104 mmol/L (ref 98–111)
Creatinine, Ser: 0.53 mg/dL (ref 0.44–1.00)
GFR, Estimated: >60 mL/min (ref >60)
Glucose, Bld: 114 mg/dL — ABNORMAL HIGH (ref 70–99)
Potassium: 3.7 mmol/L (ref 3.5–5.1)
Sodium: 137 mmol/L (ref 135–145)
Total Bilirubin: 1.1 mg/dL (ref 0.3–1.2)
Total Protein: 5.7 g/dL — ABNORMAL LOW (ref 6.5–8.1)

## 2020-06-16 LAB — CBC
HCT: 43.4 % (ref 36.0–46.0)
Hemoglobin: 14.8 g/dL (ref 12.0–15.0)
MCH: 33.5 pg (ref 26.0–34.0)
MCHC: 34.1 g/dL (ref 30.0–36.0)
MCV: 98.2 fL (ref 80.0–100.0)
Platelets: 241 10*3/uL (ref 150–400)
RBC: 4.42 MIL/uL (ref 3.87–5.11)
RDW: 13.6 % (ref 11.5–15.5)
WBC: 13.9 10*3/uL — ABNORMAL HIGH (ref 4.0–10.5)
nRBC: 0 % (ref 0.0–0.2)

## 2020-06-16 MED ORDER — MORPHINE SULFATE (PF) 2 MG/ML IV SOLN
2.0000 mg | INTRAVENOUS | Status: DC | PRN
Start: 2020-06-16 — End: 2020-06-17
  Administered 2020-06-16 (×4): 2 mg via INTRAVENOUS
  Filled 2020-06-16 (×4): qty 1

## 2020-06-16 MED ORDER — MAGNESIUM SULFATE 2 GM/50ML IV SOLN
2.0000 g | Freq: Once | INTRAVENOUS | Status: AC
  Administered 2020-06-16: 2 g via INTRAVENOUS
  Filled 2020-06-16: qty 50

## 2020-06-16 NOTE — Progress Notes (Signed)
Progress Note    Suzanne Jackson  UJW:[id number] DOB: 09/22/1970  DOA: 06/15/2020 PCP: Clovis RileyMitchell, L.August Saucerean, MD    Brief Narrative:     Medical records reviewed and are as summarized below:  Suzanne Jackson is an 50 y.o. female with medical history significant of HTN and depression presenting with epigastric pain.  For the past few days, she has been unable to keep anything down and vomiting.  This AM, she woke up about 4AM with severe epigastric pain.  She has had pancreatitis once before, maybe 6-7 years ago.    Vomiting started Sunday and yesterday.  She started drinking Friday and Saturday - drank "a lot" of liquor, "way too much."  Maybe 2 pints by herself.  Her prior episode was likely associated with alcohol.  Assessment/Plan:   Principal Problem:   Alcoholic pancreatitis Active Problems:   Hypokalemia   Hypertension   Depression   Alcohol dependence with uncomplicated withdrawal (HCC)   Tobacco dependence   Marijuana abuse   Alcoholic pancreatitis -Patient with prior h/o acute alcoholic pancreatitis x1presenting with similar symptoms, occurring after a weekend of heavy drinking -Aggressive IVF hydration at least for the first 12 hours with IVF at 200 cc/hr -Pain control with morphine 2 mg q2h prn.  -Nausea control with Zofran -advance diet to clears  Hypokalemia/hypomagnesemia -Repleted   ETOH dependence -Patient with chronic ETOH dependence -She is at high risk for complications of withdrawal including seizures, DTs -CIWA protocol -Folate, thiamine, and MVI ordered -TOC team consult for substance abuse education  HTN -Continue Norvasc -Stop HCTZ for now and consider discontinuation given severe hypokalemia  Depression -Continue Cymbalta  COPD with ongoing Tobacco dependence -Continue Symbicort (Breo formulary substitution), Allegra (Claritin formulary substitution), Singulair -Tobacco Dependence: encourage cessation.    Marijuana  abuse -Cessation encouraged; this should be encouraged on an ongoing basis -UDS ordered   Family Communication/Anticipated D/C date and plan/Code Status   DVT prophylaxis: Lovenox ordered. Code Status: Full Code.  Disposition Plan: Status is: Observation  The patient will require care spanning > 2 midnights and should be moved to inpatient because: Inpatient level of care appropriate due to severity of illness  Dispo: The patient is from: Home              Anticipated d/c is to: Home              Patient currently is not medically stable to d/c.-- biggest issue is alcohol withdrawal and confusion   Difficult to place patient No         Medical Consultants:    None.     Subjective:   Confused, pulled out IV earlier but did not remember  Objective:    Vitals:   06/15/20 2325 06/16/20 0012 06/16/20 0338 06/16/20 0736  BP: (!) 150/95  (!) 156/101 (!) 157/96  Pulse: (!) 121 (!) 124 (!) 118 (!) 130  Resp:   16   Temp:   98 F (36.7 C) 98.3 F (36.8 C)  TempSrc:   Oral Oral  SpO2:   94% 94%  Weight:      Height:        Intake/Output Summary (Last 24 hours) at 06/16/2020 0924 Last data filed at 06/16/2020 [id number] Gross per 24 hour  Intake 873.44 ml  Output 900 ml  Net -26.56 ml   Filed Weights   06/15/20 1300  Weight: 53.5 kg    Exam:  General: Appearance:    Ill appearing female in  no acute distress   Min epigastric tenderness  Lungs:     Clear to auscultation bilaterally, respirations unlabored  Heart:    Tachycardic. Normal rhythm. No murmurs, rubs, or gallops.   MS:   All extremities are intact.   Neurologic:   Awake but confused    Data Reviewed:   I have personally reviewed following labs and imaging studies:  Labs: Labs show the following:   Basic Metabolic Panel: Recent Labs  Lab 06/15/20 0700 06/15/20 0945 06/15/20 1536 06/16/20 0314  NA 132*  --  133* 137  K 2.2*  --  2.4* 3.7  CL 94*  --  96* 104  CO2 23  --  25 23  GLUCOSE 146*   --  135* 114*  BUN 7  --  5* <5*  CREATININE 0.68  --  0.59 0.53  CALCIUM 9.2  --  8.5* 8.3*  MG  --  1.5*  --   --    GFR Estimated Creatinine Clearance: 62.8 mL/min (by C-G formula based on SCr of 0.53 mg/dL). Liver Function Tests: Recent Labs  Lab 06/15/20 0700 06/16/20 0314  AST 44* 27  ALT 23 15  ALKPHOS 89 78  BILITOT 1.9* 1.1  PROT 6.9 5.7*  ALBUMIN 3.7 2.9*   Recent Labs  Lab 06/15/20 0700  LIPASE 825*   No results for input(s): AMMONIA in the last 168 hours. Coagulation profile No results for input(s): INR, PROTIME in the last 168 hours.  CBC: Recent Labs  Lab 06/15/20 0700 06/16/20 0314  WBC 11.2* 13.9*  HGB 15.3* 14.8  HCT 42.6 43.4  MCV 94.7 98.2  PLT 262 241   Cardiac Enzymes: No results for input(s): CKTOTAL, CKMB, CKMBINDEX, TROPONINI in the last 168 hours. BNP (last 3 results) No results for input(s): PROBNP in the last 8760 hours. CBG: No results for input(s): GLUCAP in the last 168 hours. D-Dimer: No results for input(s): DDIMER in the last 72 hours. Hgb A1c: No results for input(s): HGBA1C in the last 72 hours. Lipid Profile: No results for input(s): CHOL, HDL, LDLCALC, TRIG, CHOLHDL, LDLDIRECT in the last 72 hours. Thyroid function studies: No results for input(s): TSH, T4TOTAL, T3FREE, THYROIDAB in the last 72 hours.  Invalid input(s): FREET3 Anemia work up: No results for input(s): VITAMINB12, FOLATE, FERRITIN, TIBC, IRON, RETICCTPCT in the last 72 hours. Sepsis Labs: Recent Labs  Lab 06/15/20 0700 06/16/20 0314  WBC 11.2* 13.9*    Microbiology Recent Results (from the past 240 hour(s))  Resp Panel by RT-PCR (Flu A&B, Covid) Nasopharyngeal Swab     Status: None   Collection Time: 06/15/20  9:14 AM   Specimen: Nasopharyngeal Swab; Nasopharyngeal(NP) swabs in vial transport medium  Result Value Ref Range Status   SARS Coronavirus 2 by RT PCR NEGATIVE NEGATIVE Final    Comment: (NOTE) SARS-CoV-2 target nucleic acids are NOT  DETECTED.  The SARS-CoV-2 RNA is generally detectable in upper respiratory specimens during the acute phase of infection. The lowest concentration of SARS-CoV-2 viral copies this assay can detect is 138 copies/mL. A negative result does not preclude SARS-Cov-2 infection and should not be used as the sole basis for treatment or other patient management decisions. A negative result may occur with  improper specimen collection/handling, submission of specimen other than nasopharyngeal swab, presence of viral mutation(s) within the areas targeted by this assay, and inadequate number of viral copies(<138 copies/mL). A negative result must be combined with clinical observations, patient history, and epidemiological information. The expected  result is Negative.  Fact Sheet for Patients:  BloggerCourse.com  Fact Sheet for Healthcare Providers:  SeriousBroker.it  This test is no t yet approved or cleared by the [place] FDA and  has been authorized for detection and/or diagnosis of SARS-CoV-2 by FDA under an Emergency Use Authorization (EUA). This EUA will remain  in effect (meaning this test can be used) for the duration of the COVID-19 declaration under Section 564(b)(1) of the Act, 21 U.S.C.section 360bbb-3(b)(1), unless the authorization is terminated  or revoked sooner.       Influenza A by PCR NEGATIVE NEGATIVE Final   Influenza B by PCR NEGATIVE NEGATIVE Final    Comment: (NOTE) The Xpert Xpress SARS-CoV-2/FLU/RSV plus assay is intended as an aid in the diagnosis of influenza from Nasopharyngeal swab specimens and should not be used as a sole basis for treatment. Nasal washings and aspirates are unacceptable for Xpert Xpress SARS-CoV-2/FLU/RSV testing.  Fact Sheet for Patients: BloggerCourse.com  Fact Sheet for Healthcare Providers: SeriousBroker.it  This test is not yet  approved or cleared by the [place] FDA and has been authorized for detection and/or diagnosis of SARS-CoV-2 by FDA under an Emergency Use Authorization (EUA). This EUA will remain in effect (meaning this test can be used) for the duration of the COVID-19 declaration under Section 564(b)(1) of the Act, 21 U.S.C. section 360bbb-3(b)(1), unless the authorization is terminated or revoked.  Performed at [facility], [address] [place], [place] [address]     Procedures and diagnostic studies:  DG Abdomen Acute W/Chest  Result Date: 06/15/2020 CLINICAL DATA:  50 year old female with chest and epigastric pain for 2 days. Episode of vomiting. EXAM: DG ABDOMEN ACUTE WITH 1 VIEW CHEST COMPARISON:  CT Abdomen and Pelvis 04/14/2020, chest radiographs 02/28/2017. FINDINGS: Lung volumes and mediastinal contours are stable and within normal limits. Visualized tracheal air column is within normal limits. No pneumothorax or pneumoperitoneum. Lungs remain clear. Upright and supine views of the abdomen and pelvis. Stable cholecystectomy clips. Chronic lumbar fusion hardware. Non obstructed bowel gas pattern. Abdominal visceral contours are stable and within normal limits. No acute osseous abnormality identified. IMPRESSION: 1. Normal bowel gas pattern, no free air. 2. Negative chest. Electronically Signed   By: Odessa Fleming M.D.   On: 06/15/2020 07:23    Medications:   . amLODipine  10 mg Oral Daily  . DULoxetine  60 mg Oral Daily  . enoxaparin (LOVENOX) injection  40 mg Subcutaneous Q24H  . fluticasone furoate-vilanterol  1 puff Inhalation Daily  . folic acid  1 mg Oral Daily  . loratadine  10 mg Oral Daily  . LORazepam  0-4 mg Intravenous Q6H   Followed by  . [START ON 06/17/2020] LORazepam  0-4 mg Intravenous Q12H  . montelukast  10 mg Oral q morning  . multivitamin with minerals  1 tablet Oral Daily  . nicotine  21 mg Transdermal Q24H  . sodium chloride flush  3 mL Intravenous Q12H  .  thiamine  100 mg Oral Daily   Or  . thiamine  100 mg Intravenous Daily   Continuous Infusions: . 0.9 % NaCl with KCl 40 mEq / L 200 mL/hr at 06/16/20 0357  . magnesium sulfate bolus IVPB       LOS: 0 days   Joseph Art  [facility]   How to contact the [facility] Attending or Consulting provider 7A - 7P or covering provider during after hours 7P -7A, for this patient?  1.  Check the care team in [facility] and look for a) attending/consulting [facility] provider listed and b) the [facility] team listed 2. Log into [email or web address] and use [place]'s universal password to access. If you do not have the password, please contact the hospital operator. 3. Locate the [facility] provider you are looking for under [facility] and page to a number that you can be directly reached. 4. If you still have difficulty reaching the provider, please page the [facility] (Director on Call) for the Hospitalists listed on amion for assistance.  06/16/2020, 9:24 AM

## 2020-06-17 MED ORDER — HYDROCODONE-ACETAMINOPHEN 7.5-325 MG PO TABS
1.0000 | ORAL_TABLET | Freq: Four times a day (QID) | ORAL | Status: DC | PRN
  Administered 2020-06-17: 1 via ORAL
  Filled 2020-06-17: qty 1

## 2020-06-17 MED ORDER — TRAMADOL HCL 50 MG PO TABS
50.0000 mg | ORAL_TABLET | Freq: Four times a day (QID) | ORAL | Status: DC | PRN
  Administered 2020-06-17: 50 mg via ORAL
  Filled 2020-06-17: qty 1

## 2020-06-17 MED ORDER — OXYCODONE HCL 5 MG PO TABS: 5.0000 mg | ORAL_TABLET | Freq: Four times a day (QID) | ORAL | Status: DC | PRN

## 2020-06-17 NOTE — Progress Notes (Signed)
Pt tolerating PO liquid diet, pt requesting to advance diet to regular. Provider notified.

## 2020-06-17 NOTE — Progress Notes (Signed)
Progress Note    Suzanne Jackson  LKG:[id number] DOB: 12/23/1970  DOA: 06/15/2020 PCP: Clovis Riley, L.August Saucer, MD    Brief Narrative:     Medical records reviewed and are as summarized below:  Suzanne Jackson is an 50 y.o. female with medical history significant of HTN and depression presenting with epigastric pain.  For the past few days, she has been unable to keep anything down and vomiting.  This AM, she woke up about 4AM with severe epigastric pain.  She has had pancreatitis once before, maybe 6-7 years ago.    Vomiting started Sunday and yesterday.  She started drinking Friday and Saturday - drank "a lot" of liquor, "way too much."  Maybe 2 pints by herself.  Her prior episode was likely associated with alcohol.  Assessment/Plan:   Principal Problem:   Alcoholic pancreatitis Active Problems:   Hypokalemia   Hypertension   Depression   Alcohol dependence with uncomplicated withdrawal (HCC)   Tobacco dependence   Marijuana abuse   Acute pancreatitis   Alcoholic pancreatitis -Patient with prior h/o acute alcoholic pancreatitis x1presenting with similar symptoms, occurring after a weekend of heavy drinking -advance diet  Hypokalemia/hypomagnesemia -Repleted   ETOH dependence -Patient with chronic ETOH dependence -TOC team consult for substance abuse education -plan for nalrexone 50 mg orally once daily  HTN -Continue Norvasc -Stop HCTZ for now and consider discontinuation given severe hypokalemia  Depression -Continue Cymbalta  COPD with ongoing Tobacco dependence -Continue Symbicort (Breo formulary substitution), Allegra (Claritin formulary substitution), Singulair -Tobacco Dependence: encourage cessation.    Marijuana abuse -Cessation encouraged; this should be encouraged on an ongoing basis -UDS ordered   Family Communication/Anticipated D/C date and plan/Code Status   DVT prophylaxis: Lovenox ordered. Code Status: Full Code.  Disposition Plan:  Status is: inpt  Dispo: The patient is from: Home              Anticipated d/c is to: Home              Patient currently is not medically stable to d/c.-- home in AM   Difficult to place patient No         Medical Consultants:    None.     Subjective:   Tolerating clears  Objective:    Vitals:   06/16/20 1952 06/16/20 2232 06/17/20 0002 06/17/20 0416  BP: 139/89  133/75 140/84  Pulse: (!) 110 (!) 110 (!) 107 (!) 102  Resp: 19  18 19   Temp: 98.8 F (37.1 C)  99.4 F (37.4 C) 98.9 F (37.2 C)  TempSrc: Oral  Oral Oral  SpO2: 95%  91% 91%  Weight:      Height:        Intake/Output Summary (Last 24 hours) at 06/17/2020 1117 Last data filed at 06/17/2020 0119 Gross per 24 hour  Intake 6010.68 ml  Output --  Net 6010.68 ml   Filed Weights   06/15/20 1300  Weight: 53.5 kg    Exam:  General: Appearance:    Well developed, well nourished female in no acute distress     Lungs:     Clear to auscultation bilaterally, respirations unlabored  Heart:    Tachycardic.    MS:   All extremities are intact.   Neurologic:   Awake, alert, oriented x 3. No apparent focal neurological           defect.      Data Reviewed:   I have personally reviewed following labs  and imaging studies:  Labs: Labs show the following:   Basic Metabolic Panel: Recent Labs  Lab 06/15/20 0700 06/15/20 0945 06/15/20 1536 06/16/20 0314  NA 132*  --  133* 137  K 2.2*  --  2.4* 3.7  CL 94*  --  96* 104  CO2 23  --  25 23  GLUCOSE 146*  --  135* 114*  BUN 7  --  5* <5*  CREATININE 0.68  --  0.59 0.53  CALCIUM 9.2  --  8.5* 8.3*  MG  --  1.5*  --   --    GFR Estimated Creatinine Clearance: 62.8 mL/min (by C-G formula based on SCr of 0.53 mg/dL). Liver Function Tests: Recent Labs  Lab 06/15/20 0700 06/16/20 0314  AST 44* 27  ALT 23 15  ALKPHOS 89 78  BILITOT 1.9* 1.1  PROT 6.9 5.7*  ALBUMIN 3.7 2.9*   Recent Labs  Lab 06/15/20 0700  LIPASE 825*   No results for  input(s): AMMONIA in the last 168 hours. Coagulation profile No results for input(s): INR, PROTIME in the last 168 hours.  CBC: Recent Labs  Lab 06/15/20 0700 06/16/20 0314  WBC 11.2* 13.9*  HGB 15.3* 14.8  HCT 42.6 43.4  MCV 94.7 98.2  PLT 262 241   Cardiac Enzymes: No results for input(s): CKTOTAL, CKMB, CKMBINDEX, TROPONINI in the last 168 hours. BNP (last 3 results) No results for input(s): PROBNP in the last 8760 hours. CBG: No results for input(s): GLUCAP in the last 168 hours. D-Dimer: No results for input(s): DDIMER in the last 72 hours. Hgb A1c: No results for input(s): HGBA1C in the last 72 hours. Lipid Profile: No results for input(s): CHOL, HDL, LDLCALC, TRIG, CHOLHDL, LDLDIRECT in the last 72 hours. Thyroid function studies: No results for input(s): TSH, T4TOTAL, T3FREE, THYROIDAB in the last 72 hours.  Invalid input(s): FREET3 Anemia work up: No results for input(s): VITAMINB12, FOLATE, FERRITIN, TIBC, IRON, RETICCTPCT in the last 72 hours. Sepsis Labs: Recent Labs  Lab 06/15/20 0700 06/16/20 0314  WBC 11.2* 13.9*    Microbiology Recent Results (from the past 240 hour(s))  Resp Panel by RT-PCR (Flu A&B, Covid) Nasopharyngeal Swab     Status: None   Collection Time: 06/15/20  9:14 AM   Specimen: Nasopharyngeal Swab; Nasopharyngeal(NP) swabs in vial transport medium  Result Value Ref Range Status   SARS Coronavirus 2 by RT PCR NEGATIVE NEGATIVE Final    Comment: (NOTE) SARS-CoV-2 target nucleic acids are NOT DETECTED.  The SARS-CoV-2 RNA is generally detectable in upper respiratory specimens during the acute phase of infection. The lowest concentration of SARS-CoV-2 viral copies this assay can detect is 138 copies/mL. A negative result does not preclude SARS-Cov-2 infection and should not be used as the sole basis for treatment or other patient management decisions. A negative result may occur with  improper specimen collection/handling, submission  of specimen other than nasopharyngeal swab, presence of viral mutation(s) within the areas targeted by this assay, and inadequate number of viral copies(<138 copies/mL). A negative result must be combined with clinical observations, patient history, and epidemiological information. The expected result is Negative.  Fact Sheet for Patients:  BloggerCourse.com  Fact Sheet for Healthcare Providers:  SeriousBroker.it  This test is no t yet approved or cleared by the [place] FDA and  has been authorized for detection and/or diagnosis of SARS-CoV-2 by FDA under an Emergency Use Authorization (EUA). This EUA will remain  in effect (meaning this test can  be used) for the duration of the COVID-19 declaration under Section 564(b)(1) of the Act, 21 U.S.C.section 360bbb-3(b)(1), unless the authorization is terminated  or revoked sooner.       Influenza A by PCR NEGATIVE NEGATIVE Final   Influenza B by PCR NEGATIVE NEGATIVE Final    Comment: (NOTE) The Xpert Xpress SARS-CoV-2/FLU/RSV plus assay is intended as an aid in the diagnosis of influenza from Nasopharyngeal swab specimens and should not be used as a sole basis for treatment. Nasal washings and aspirates are unacceptable for Xpert Xpress SARS-CoV-2/FLU/RSV testing.  Fact Sheet for Patients: BloggerCourse.com  Fact Sheet for Healthcare Providers: SeriousBroker.it  This test is not yet approved or cleared by the [place] FDA and has been authorized for detection and/or diagnosis of SARS-CoV-2 by FDA under an Emergency Use Authorization (EUA). This EUA will remain in effect (meaning this test can be used) for the duration of the COVID-19 declaration under Section 564(b)(1) of the Act, 21 U.S.C. section 360bbb-3(b)(1), unless the authorization is terminated or revoked.  Performed at [facility], [address],  [place], [place] [address]     Procedures and diagnostic studies:  No results found.  Medications:   . amLODipine  10 mg Oral Daily  . DULoxetine  60 mg Oral Daily  . enoxaparin (LOVENOX) injection  40 mg Subcutaneous Q24H  . fluticasone furoate-vilanterol  1 puff Inhalation Daily  . folic acid  1 mg Oral Daily  . loratadine  10 mg Oral Daily  . montelukast  10 mg Oral q morning  . multivitamin with minerals  1 tablet Oral Daily  . nicotine  21 mg Transdermal Q24H  . sodium chloride flush  3 mL Intravenous Q12H  . thiamine  100 mg Oral Daily   Or  . thiamine  100 mg Intravenous Daily   Continuous Infusions:    LOS: 1 day   Joseph Art  [facility]   How to contact the [facility] Attending or Consulting provider 7A - 7P or covering provider during after hours 7P -7A, for this patient?  1. Check the care team in [facility] and look for a) attending/consulting TRH provider listed and b) the [facility] team listed 2. Log into [email or web address] and use [place]'s universal password to access. If you do not have the password, please contact the hospital operator. 3. Locate the [facility] provider you are looking for under [facility] and page to a number that you can be directly reached. 4. If you still have difficulty reaching the provider, please page the [facility] (Director on Call) for the Hospitalists listed on amion for assistance.  06/17/2020, 11:17 AM

## 2020-06-17 NOTE — TOC CAGE-AID Note (Signed)
Transition of Care [facility]) - CAGE-AID Screening   Patient Details  Name: Suzanne Jackson MRN: [id number] Date of Birth: 03/01/1970  [facility] of [facility]) CM/SW Contact:    Emeterio Reeve, [place] Phone Number: 06/17/2020, 12:16 PM   Clinical Narrative:  CSW met with pt at bedside. CSW introduced self and explained her role at the hospital.  Pt reports she drinks about every other day and drinks about a pint of alcohol a day. Pt reports she knows she needs to stop but enjoys drinking her alcohol. CSW offered resources and pt declined. Pt states she can do it on her own when she is ready.   CAGE-AID Screening:    Have You Ever Felt You Ought to Cut Down on Your Drinking or Drug Use?: Yes Have People Annoyed You By Critizing Your Drinking Or Drug Use?: Yes Have You Felt Bad Or Guilty About Your Drinking Or Drug Use?: Yes Have You Ever Had a Drink or Used Drugs First Thing In The Morning to Steady Your Nerves or to Get Rid of a Hangover?: No CAGE-AID Score: 3     Substance abuse interventions: Patient Counseling,Educational Materials   Emeterio Reeve, Latanya Presser, [place] Social Worker [phone]

## 2020-06-18 LAB — CBC
HCT: 32.9 % — ABNORMAL LOW (ref 36.0–46.0)
Hemoglobin: 11.2 g/dL — ABNORMAL LOW (ref 12.0–15.0)
MCH: 33.9 pg (ref 26.0–34.0)
MCHC: 34 g/dL (ref 30.0–36.0)
MCV: 99.7 fL (ref 80.0–100.0)
Platelets: 166 10*3/uL (ref 150–400)
RBC: 3.3 MIL/uL — ABNORMAL LOW (ref 3.87–5.11)
RDW: 13.7 % (ref 11.5–15.5)
WBC: 11.5 10*3/uL — ABNORMAL HIGH (ref 4.0–10.5)
nRBC: 0 % (ref 0.0–0.2)

## 2020-06-18 LAB — COMPREHENSIVE METABOLIC PANEL
ALT: 14 U/L (ref 0–44)
AST: 21 U/L (ref 15–41)
Albumin: 2.4 g/dL — ABNORMAL LOW (ref 3.5–5.0)
Alkaline Phosphatase: 66 U/L (ref 38–126)
Anion gap: 8 (ref 5–15)
BUN: <5 mg/dL — ABNORMAL LOW (ref 6–20)
CO2: 28 mmol/L (ref 22–32)
Calcium: 8.6 mg/dL — ABNORMAL LOW (ref 8.9–10.3)
Chloride: 98 mmol/L (ref 98–111)
Creatinine, Ser: 0.48 mg/dL (ref 0.44–1.00)
GFR, Estimated: >60 mL/min (ref >60)
Glucose, Bld: 94 mg/dL (ref 70–99)
Potassium: 3.5 mmol/L (ref 3.5–5.1)
Sodium: 134 mmol/L — ABNORMAL LOW (ref 135–145)
Total Bilirubin: 0.8 mg/dL (ref 0.3–1.2)
Total Protein: 5.4 g/dL — ABNORMAL LOW (ref 6.5–8.1)

## 2020-06-18 MED ORDER — NALTREXONE HCL 50 MG PO TABS: 50.0000 mg | ORAL_TABLET | Freq: Every day | ORAL | 0 refills | Status: DC

## 2020-06-18 MED ORDER — DISULFIRAM 500 MG PO TABS: 500.0000 mg | ORAL_TABLET | Freq: Every day | ORAL | 0 refills | Status: DC

## 2020-06-18 NOTE — Discharge Summary (Signed)
Physician Discharge Summary  Suzanne Jackson ZOX:[id number] DOB: 05/07/1970 DOA: 06/15/2020  PCP: Clovis RileyMitchell, L.August Saucerean, MD  Admit date: 06/15/2020 Discharge date: 06/18/2020  Admitted From: home Discharge disposition: home   Recommendations for Outpatient Follow-Up:   1. Antabuse started 2. BMP 1 week    Discharge Diagnosis:   Principal Problem:   Alcoholic pancreatitis Active Problems:   Hypokalemia   Hypertension   Depression   Alcohol dependence with uncomplicated withdrawal (HCC)   Tobacco dependence   Marijuana abuse   Acute pancreatitis    Discharge Condition: Improved.  Diet recommendation: Low sodium, heart healthy  Wound care: None.  Code status: Full.   History of Present Illness:   Suzanne Jackson is a 50 y.o. female with medical history significant of HTN and depression presenting with epigastric pain.  For the past few days, she has been unable to keep anything down and vomiting.  This AM, she woke up about 4AM with severe epigastric pain.  She has had pancreatitis once before, maybe 6-7 years ago.    Vomiting started Sunday and yesterday.  She started drinking Friday and Saturday - drank "a lot" of liquor, "way too much."  Maybe 2 pints by herself.  Her prior episode was likely associated with alcohol.   Hospital Course by Problem:   Alcoholic pancreatitis -Patient with prior h/o acutealcoholicpancreatitis x1presenting with similar symptoms, occurring after a weekend of heavy drinking -advanced diet  Hypokalemia/hypomagnesemia -Repleted   ETOH dependence -Patient with chronic ETOH dependence -TOC team consult forsubstance abuse education -on daily opioids despite this not being shown on home meds-- was not able to use naltrexone so will try antabuse  HTN -resume home meds  Depression -Continue Cymbalta  COPD with ongoingTobacco dependence -resume home meds -Tobacco Dependence: encourage cessation.   Marijuana  abuse -Cessation encouraged; this should be encouraged on an ongoing basis     Medical Consultants:      Discharge Exam:   Vitals:   06/17/20 2335 06/18/20 0415  BP: 135/83 123/80  Pulse: (!) 101 (!) 102  Resp: 18 18  Temp: 99.3 F (37.4 C) 99.1 F (37.3 C)  SpO2: 91% 96%   Vitals:   06/17/20 1634 06/17/20 2032 06/17/20 2335 06/18/20 0415  BP: 134/89 131/82 135/83 123/80  Pulse: (!) 111 (!) 104 (!) 101 (!) 102  Resp: 18 20 18 18   Temp: 98.3 F (36.8 C) 98.9 F (37.2 C) 99.3 F (37.4 C) 99.1 F (37.3 C)  TempSrc: Oral Oral Oral Oral  SpO2: 95% 93% 91% 96%  Weight:      Height:        General exam: Appears calm and comfortable.  The results of significant diagnostics from this hospitalization (including imaging, microbiology, ancillary and laboratory) are listed below for reference.     Procedures and Diagnostic Studies:   DG Abdomen Acute W/Chest  Result Date: 06/15/2020 CLINICAL DATA:  50 year old female with chest and epigastric pain for 2 days. Episode of vomiting. EXAM: DG ABDOMEN ACUTE WITH 1 VIEW CHEST COMPARISON:  CT Abdomen and Pelvis 04/14/2020, chest radiographs 02/28/2017. FINDINGS: Lung volumes and mediastinal contours are stable and within normal limits. Visualized tracheal air column is within normal limits. No pneumothorax or pneumoperitoneum. Lungs remain clear. Upright and supine views of the abdomen and pelvis. Stable cholecystectomy clips. Chronic lumbar fusion hardware. Non obstructed bowel gas pattern. Abdominal visceral contours are stable and within normal limits. No acute osseous abnormality identified. IMPRESSION: 1. Normal bowel gas pattern,  no free air. 2. Negative chest. Electronically Signed   By: Odessa Fleming M.D.   On: 06/15/2020 07:23     Labs:   Basic Metabolic Panel: Recent Labs  Lab 06/15/20 0700 06/15/20 0945 06/15/20 1536 06/16/20 0314 06/18/20 0324  NA 132*  --  133* 137 134*  K 2.2*  --  2.4* 3.7 3.5  CL 94*  --  96* 104  98  CO2 23  --  25 23 28   GLUCOSE 146*  --  135* 114* 94  BUN 7  --  5* <5* <5*  CREATININE 0.68  --  0.59 0.53 0.48  CALCIUM 9.2  --  8.5* 8.3* 8.6*  MG  --  1.5*  --   --   --    GFR Estimated Creatinine Clearance: 62.8 mL/min (by C-G formula based on SCr of 0.48 mg/dL). Liver Function Tests: Recent Labs  Lab 06/15/20 0700 06/16/20 0314 06/18/20 0324  AST 44* 27 21  ALT 23 15 14   ALKPHOS 89 78 66  BILITOT 1.9* 1.1 0.8  PROT 6.9 5.7* 5.4*  ALBUMIN 3.7 2.9* 2.4*   Recent Labs  Lab 06/15/20 0700  LIPASE 825*   No results for input(s): AMMONIA in the last 168 hours. Coagulation profile No results for input(s): INR, PROTIME in the last 168 hours.  CBC: Recent Labs  Lab 06/15/20 0700 06/16/20 0314 06/18/20 0324  WBC 11.2* 13.9* 11.5*  HGB 15.3* 14.8 11.2*  HCT 42.6 43.4 32.9*  MCV 94.7 98.2 99.7  PLT 262 241 166   Cardiac Enzymes: No results for input(s): CKTOTAL, CKMB, CKMBINDEX, TROPONINI in the last 168 hours. BNP: Invalid input(s): POCBNP CBG: No results for input(s): GLUCAP in the last 168 hours. D-Dimer No results for input(s): DDIMER in the last 72 hours. Hgb A1c No results for input(s): HGBA1C in the last 72 hours. Lipid Profile No results for input(s): CHOL, HDL, LDLCALC, TRIG, CHOLHDL, LDLDIRECT in the last 72 hours. Thyroid function studies No results for input(s): TSH, T4TOTAL, T3FREE, THYROIDAB in the last 72 hours.  Invalid input(s): FREET3 Anemia work up No results for input(s): VITAMINB12, FOLATE, FERRITIN, TIBC, IRON, RETICCTPCT in the last 72 hours. Microbiology Recent Results (from the past 240 hour(s))  Resp Panel by RT-PCR (Flu A&B, Covid) Nasopharyngeal Swab     Status: None   Collection Time: 06/15/20  9:14 AM   Specimen: Nasopharyngeal Swab; Nasopharyngeal(NP) swabs in vial transport medium  Result Value Ref Range Status   SARS Coronavirus 2 by RT PCR NEGATIVE NEGATIVE Final    Comment: (NOTE) SARS-CoV-2 target nucleic acids  are NOT DETECTED.  The SARS-CoV-2 RNA is generally detectable in upper respiratory specimens during the acute phase of infection. The lowest concentration of SARS-CoV-2 viral copies this assay can detect is 138 copies/mL. A negative result does not preclude SARS-Cov-2 infection and should not be used as the sole basis for treatment or other patient management decisions. A negative result may occur with  improper specimen collection/handling, submission of specimen other than nasopharyngeal swab, presence of viral mutation(s) within the areas targeted by this assay, and inadequate number of viral copies(<138 copies/mL). A negative result must be combined with clinical observations, patient history, and epidemiological information. The expected result is Negative.  Fact Sheet for Patients:  08/18/20  Fact Sheet for Healthcare Providers:  06/17/20  This test is no t yet approved or cleared by the [employer] FDA and  has been authorized for detection and/or diagnosis of SARS-CoV-2 by FDA  under an Emergency Use Authorization (EUA). This EUA will remain  in effect (meaning this test can be used) for the duration of the COVID-19 declaration under Section 564(b)(1) of the Act, 21 U.S.C.section 360bbb-3(b)(1), unless the authorization is terminated  or revoked sooner.       Influenza A by PCR NEGATIVE NEGATIVE Final   Influenza B by PCR NEGATIVE NEGATIVE Final    Comment: (NOTE) The Xpert Xpress SARS-CoV-2/FLU/RSV plus assay is intended as an aid in the diagnosis of influenza from Nasopharyngeal swab specimens and should not be used as a sole basis for treatment. Nasal washings and aspirates are unacceptable for Xpert Xpress SARS-CoV-2/FLU/RSV testing.  Fact Sheet for Patients: BloggerCourse.com  Fact Sheet for Healthcare Providers: SeriousBroker.it  This test is  not yet approved or cleared by the [place] FDA and has been authorized for detection and/or diagnosis of SARS-CoV-2 by FDA under an Emergency Use Authorization (EUA). This EUA will remain in effect (meaning this test can be used) for the duration of the COVID-19 declaration under Section 564(b)(1) of the Act, 21 U.S.C. section 360bbb-3(b)(1), unless the authorization is terminated or revoked.  Performed at [facility], [address] [place], [place] [address]      Discharge Instructions:   Discharge Instructions    Discharge instructions   Complete by: As directed    Frequent small meals until feeling better   Increase activity slowly   Complete by: As directed      Allergies as of 06/18/2020      Reactions   Aspirin Anaphylaxis   Hydromorphone Itching   Ibuprofen Anaphylaxis   Percocet [oxycodone-acetaminophen] Itching   Aleve [naproxen]    Other reaction(s): wheeze   Bupropion    Other reaction(s): anger      Medication List    STOP taking these medications   hydrochlorothiazide 12.5 MG capsule Commonly known as: MICROZIDE   HYDROcodone-acetaminophen 7.5-325 MG tablet Commonly known as: NORCO   potassium chloride SA 20 MEQ tablet Commonly known as: KLOR-CON     TAKE these medications   acetaminophen 500 MG tablet Commonly known as: TYLENOL Take 1,000 mg by mouth every 6 (six) hours as needed for moderate pain or headache.   amLODipine 10 MG tablet Commonly known as: NORVASC Take 10 mg by mouth daily.   budesonide-formoterol 160-4.5 MCG/ACT inhaler Commonly known as: SYMBICORT Inhale 1 puff into the lungs 2 (two) times daily.   DULoxetine 60 MG capsule Commonly known as: CYMBALTA Take 60 mg by mouth daily.   fexofenadine 180 MG tablet Commonly known as: ALLEGRA Take 180 mg by mouth daily.   hydrOXYzine 50 MG tablet Commonly known as: ATARAX/VISTARIL Take 1 tablet (50 mg total) by mouth every 6 (six) hours as needed (refractory  itching).   montelukast 10 MG tablet Commonly known as: SINGULAIR Take 10 mg by mouth every morning.   naltrexone 50 MG tablet Commonly known as: DEPADE Take 1 tablet (50 mg total) by mouth daily.   ProAir HFA 108 (90 Base) MCG/ACT inhaler Generic drug: albuterol Inhale 1-2 puffs into the lungs every 6 (six) hours as needed for wheezing or shortness of breath.       Follow-up Information    Clovis Riley, L.August Saucer, MD Follow up in 1 week(s).   Specialty: Family Medicine Contact information: [address] [place] [address] [phone]                Time coordinating discharge: 35 min  Signed:  Joseph Art DO  [facility] 06/18/2020, 7:56 AM

## 2020-06-18 NOTE — Discharge Instructions (Signed)
Would strongly encourage you to find an AA program for help with alcohol use Discuss medications with your PCP-- if you are on chronic pain meds will need a different medication other than naltrexone

## 2020-06-18 NOTE — Progress Notes (Signed)
Patient has ordered for discharge. Given discharge instructions with paper to the patient and family. Iv removed. All belongings given to the patient.

## 2020-06-30 DIAGNOSIS — F1010 Alcohol abuse, uncomplicated: Secondary | ICD-10-CM | POA: Diagnosis not present

## 2020-06-30 DIAGNOSIS — K852 Alcohol induced acute pancreatitis without necrosis or infection: Secondary | ICD-10-CM | POA: Diagnosis not present

## 2020-08-20 DIAGNOSIS — K859 Acute pancreatitis without necrosis or infection, unspecified: Secondary | ICD-10-CM | POA: Diagnosis not present

## 2020-08-20 DIAGNOSIS — R899 Unspecified abnormal finding in specimens from other organs, systems and tissues: Secondary | ICD-10-CM | POA: Diagnosis not present

## 2020-09-21 DIAGNOSIS — I10 Essential (primary) hypertension: Secondary | ICD-10-CM | POA: Diagnosis not present

## 2020-09-21 DIAGNOSIS — F322 Major depressive disorder, single episode, severe without psychotic features: Secondary | ICD-10-CM | POA: Diagnosis not present

## 2020-09-21 DIAGNOSIS — Z23 Encounter for immunization: Secondary | ICD-10-CM | POA: Diagnosis not present

## 2020-09-21 DIAGNOSIS — M5459 Other low back pain: Secondary | ICD-10-CM | POA: Diagnosis not present

## 2020-09-21 DIAGNOSIS — E876 Hypokalemia: Secondary | ICD-10-CM | POA: Diagnosis not present

## 2020-10-25 ENCOUNTER — Other Ambulatory Visit: Payer: Self-pay | Admitting: Family Medicine

## 2020-10-25 DIAGNOSIS — Z1231 Encounter for screening mammogram for malignant neoplasm of breast: Secondary | ICD-10-CM

## 2020-10-27 ENCOUNTER — Ambulatory Visit
Admission: RE | Admit: 2020-10-27 | Discharge: 2020-10-27 | Disposition: A | Discharge status: Home / Self Care | Payer: BC Managed Care – PPO | Source: Ambulatory Visit | Attending: Family Medicine | Admitting: Family Medicine

## 2020-10-27 ENCOUNTER — Other Ambulatory Visit: Payer: Self-pay

## 2020-10-27 DIAGNOSIS — Z1231 Encounter for screening mammogram for malignant neoplasm of breast: Secondary | ICD-10-CM

## 2021-02-19 IMAGING — DX LEFT RIBS AND CHEST - 3+ VIEW
5 series · 5 of 5 positions shown · non-contrast
Comparison: 02/28/2017 chest radiograph

CLINICAL DATA: Left-sided rib and pleuritic chest pain. No known
injury.

EXAM:
LEFT RIBS AND CHEST - 3+ VIEW

[chest pa]
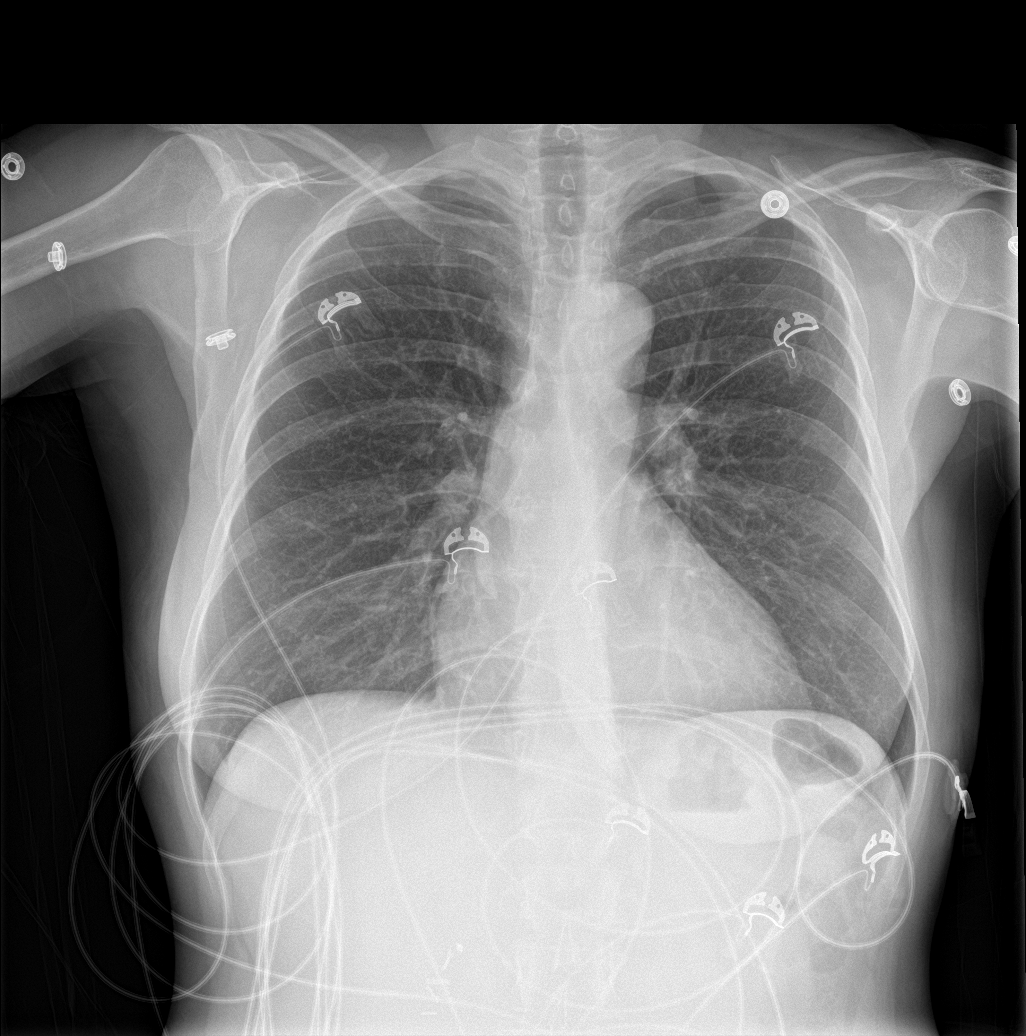

[rib pa obl (1 of 2)]
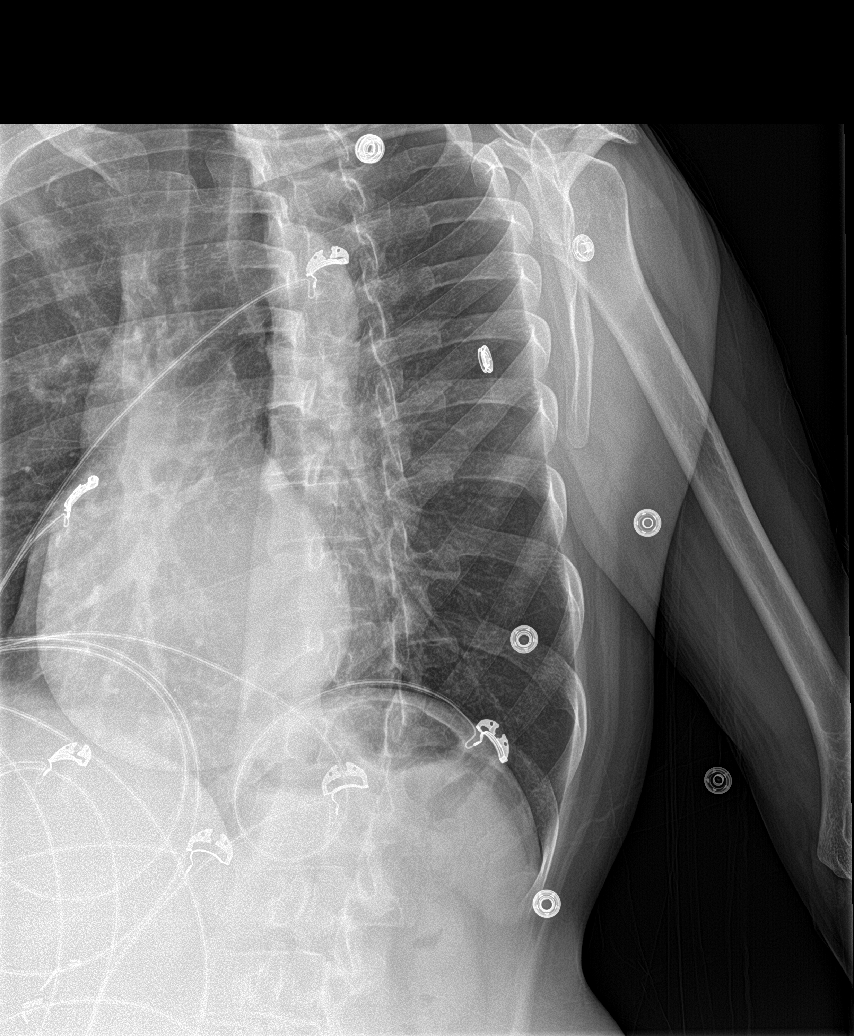

[rib pa obl (2 of 2)]
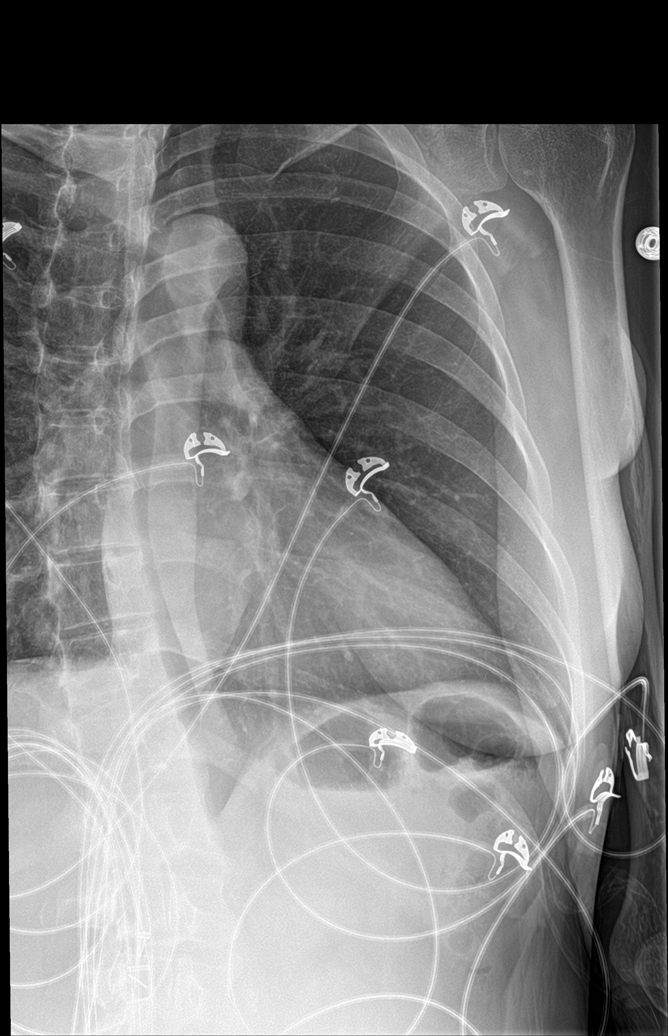

[rib pa (1 of 2)]
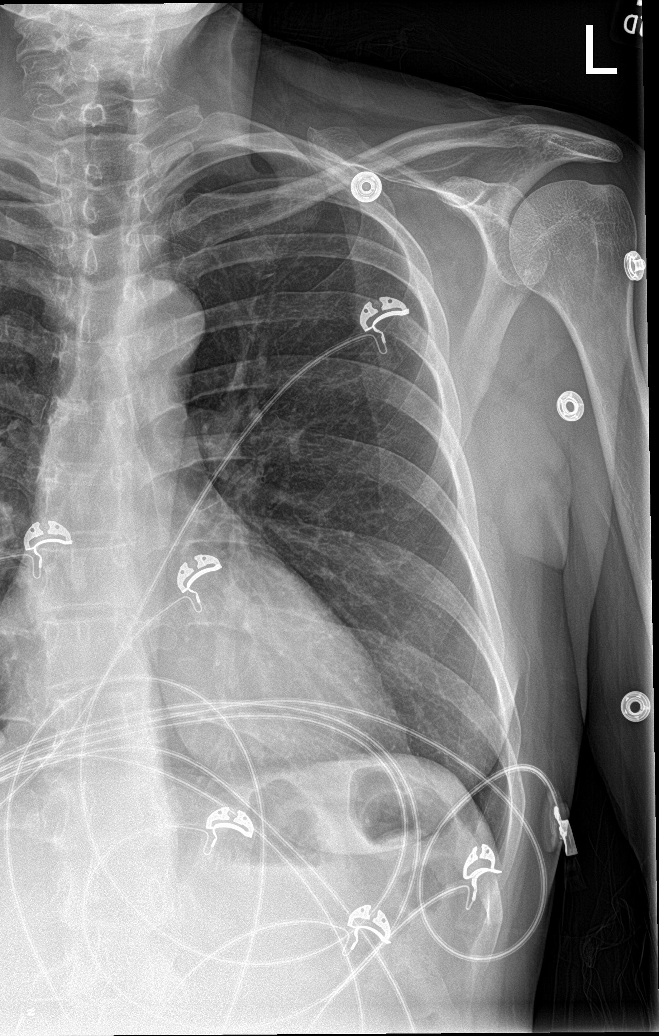

[rib pa (2 of 2)]
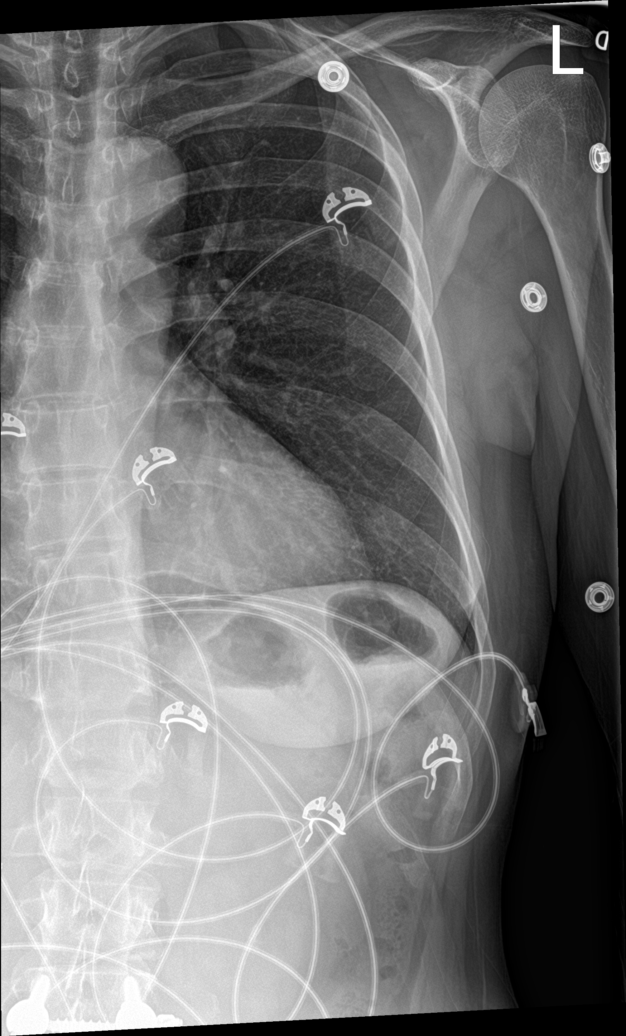

[5 of 5 positions shown; findings below may reference images not displayed]

FINDINGS: No fracture or other bone lesions are seen involving the ribs. There
is no evidence of pneumothorax or pleural effusion. Both lungs are
clear. Heart size and mediastinal contours are within normal limits.
IMPRESSION: Negative.

## 2021-03-21 DIAGNOSIS — M5459 Other low back pain: Secondary | ICD-10-CM | POA: Diagnosis not present

## 2021-03-21 DIAGNOSIS — I10 Essential (primary) hypertension: Secondary | ICD-10-CM | POA: Diagnosis not present

## 2021-03-21 DIAGNOSIS — Z23 Encounter for immunization: Secondary | ICD-10-CM | POA: Diagnosis not present

## 2021-03-21 DIAGNOSIS — F322 Major depressive disorder, single episode, severe without psychotic features: Secondary | ICD-10-CM | POA: Diagnosis not present

## 2021-03-21 DIAGNOSIS — E876 Hypokalemia: Secondary | ICD-10-CM | POA: Diagnosis not present

## 2021-05-09 DIAGNOSIS — Z01419 Encounter for gynecological examination (general) (routine) without abnormal findings: Secondary | ICD-10-CM | POA: Diagnosis not present

## 2021-07-13 ENCOUNTER — Ambulatory Visit (INDEPENDENT_AMBULATORY_CARE_PROVIDER_SITE_OTHER): Payer: BC Managed Care – PPO

## 2021-07-13 ENCOUNTER — Ambulatory Visit: Payer: BC Managed Care – PPO | Admitting: Podiatry

## 2021-07-13 DIAGNOSIS — M2041 Other hammer toe(s) (acquired), right foot: Secondary | ICD-10-CM | POA: Diagnosis not present

## 2021-07-13 DIAGNOSIS — M21619 Bunion of unspecified foot: Secondary | ICD-10-CM

## 2021-07-13 DIAGNOSIS — E559 Vitamin D deficiency, unspecified: Secondary | ICD-10-CM

## 2021-07-16 ENCOUNTER — Encounter: Payer: Self-pay | Admitting: Podiatry

## 2021-07-16 NOTE — Progress Notes (Signed)
  Subjective:  Patient ID: Suzanne Jackson, female    DOB: [birth year],  MRN: [id number]  Chief Complaint  Patient presents with   Bunions    )(np) right foot bunion pain/2nd toe pain    51 y.o. female presents with the above complaint. History confirmed with patient.  Pain is under the second metatarsal and on the second toe.  She has a significant bunion.  She has a history of back pain and takes pain medication for this.  She smokes about a pack and half per day.  She has previously quit for a few years when she was pregnant.  Objective:  Physical Exam: warm, good capillary refill, no trophic changes or ulcerative lesions, normal DP and PT pulses, normal sensory exam, and right foot severe hallux valgus deformity, she has major hypermobility, pain under the second MTPJ  Radiographs: Multiple views x-ray of the right foot: Super hallux valgus deformity there is a large increase in the IM angle and hallux abductus angle.  Frontal plane rotation of the metatarsal.  There is an elongated second metatarsal. Assessment:   1. Bunion   2. Hammertoe of second toe of right foot   3. Vitamin D deficiency      Plan:  Patient was evaluated and treated and all questions answered.  Discussed the etiology and treatment including surgical and non surgical treatment for painful bunions and hammertoes.  She has exhausted all non surgical treatment prior to this visit including shoe gear changes and padding.  She desires surgical intervention. We discussed all risks including but not limited to: pain, swelling, infection, scar, numbness which may be temporary or permanent, chronic pain, stiffness, nerve pain or damage, wound healing problems, bone healing problems including delayed or non-union and recurrence.  We also specifically discussed the risk of smoking and she will work on quitting.  We discussed how this contributes to wound and bone healing issues.  Specifically we discussed the following  procedures: Lapidus bunionectomy, possible Akin, Weil osteotomy of the second metatarsal and hammertoe correction second with bone graft from the heel. Informed consent was signed today. Surgery will be scheduled at a mutually agreeable date. Information regarding this will be forwarded to our surgery scheduler.  We will check a vitamin D level prior to surgery.   Surgical plan:  Procedure: -Right foot Lapidus bunionectomy, possible Akin, Weil osteotomy of the second metatarsal and hammertoe correction second with bone graft from the heel  Location: -[facility]  Anesthesia plan: -IV sedation with regional block  Postoperative pain plan: - Tylenol 1000 mg every 6 hours, ibuprofen 600 mg every 8 hours, gabapentin 300 mg every 8 hours x5 days, oxycodone 5 mg 1-2 tabs every 6 hours only as needed  DVT prophylaxis: -ASA 300 mg twice daily  WB Restrictions / DME needs: -NWB in CAM boot for 4 weeks  No follow-ups on file.

## 2021-09-21 DIAGNOSIS — E876 Hypokalemia: Secondary | ICD-10-CM | POA: Diagnosis not present

## 2021-09-21 DIAGNOSIS — F322 Major depressive disorder, single episode, severe without psychotic features: Secondary | ICD-10-CM | POA: Diagnosis not present

## 2021-09-21 DIAGNOSIS — M5459 Other low back pain: Secondary | ICD-10-CM | POA: Diagnosis not present

## 2021-09-21 DIAGNOSIS — Q453 Other congenital malformations of pancreas and pancreatic duct: Secondary | ICD-10-CM | POA: Diagnosis not present

## 2021-09-21 DIAGNOSIS — M899 Disorder of bone, unspecified: Secondary | ICD-10-CM | POA: Diagnosis not present

## 2021-09-21 DIAGNOSIS — I10 Essential (primary) hypertension: Secondary | ICD-10-CM | POA: Diagnosis not present

## 2021-09-21 DIAGNOSIS — K625 Hemorrhage of anus and rectum: Secondary | ICD-10-CM | POA: Diagnosis not present

## 2021-09-22 LAB — LAB REPORT - SCANNED: EGFR: 111

## 2021-09-28 ENCOUNTER — Telehealth: Payer: Self-pay | Admitting: Urology

## 2021-09-28 NOTE — Telephone Encounter (Signed)
DOS - 10/28/21  AIKEN OSTEOTOMY RIGHT --- 34917 LAPIDUS PROCEDURE INCLUDING BUNIONECTOMY RIGHT --- 91505 METATARSAL OSTEOTOMY 2ND RIGHT --- 69794 HAMMERTOE REPAIR 2ND RIGHT --- 80165 BONE BIOPSY RIGHT --- 20900  [employer] EFFECTIVE DATE - 01/16/21  PLAN DEDUCTIBLE - $2,000.00 W/ $0.00 REMAINING OUT OF POCKET - $6,250.00 W/ $2,853.17 REMAINING COINSURANCE - 20% COPAY - $0.00   NO PRIOR AUTH IS REQUIRED.

## 2021-10-31 ENCOUNTER — Telehealth: Payer: Self-pay | Admitting: Podiatry

## 2021-10-31 NOTE — Telephone Encounter (Signed)
Called and spoke to Suzanne Jackson with [employer] in regards to the pending prior authorization for surgery for CPT codes 28310, 28297, 28308, and 28285. Prior Authorization was approved and the  reference/authorization number: [id number] and it is valid from  10/28/2021 - 12/26/2021.

## 2021-11-01 NOTE — Telephone Encounter (Signed)
Per Kathlene Cote with [employer], prior authorization is not required for CPT code 20900. Call reference number: [id number]

## 2021-11-03 ENCOUNTER — Encounter: Payer: BC Managed Care – PPO | Admitting: Podiatry

## 2021-11-17 ENCOUNTER — Encounter: Payer: BC Managed Care – PPO | Admitting: Podiatry

## 2021-12-13 ENCOUNTER — Encounter: Payer: BC Managed Care – PPO | Admitting: Podiatry

## 2021-12-23 ENCOUNTER — Encounter: Payer: Self-pay | Admitting: Podiatry

## 2021-12-23 ENCOUNTER — Other Ambulatory Visit: Payer: Self-pay | Admitting: Podiatry

## 2021-12-23 DIAGNOSIS — R2689 Other abnormalities of gait and mobility: Secondary | ICD-10-CM

## 2021-12-23 DIAGNOSIS — M21611 Bunion of right foot: Secondary | ICD-10-CM | POA: Diagnosis not present

## 2021-12-23 DIAGNOSIS — M2011 Hallux valgus (acquired), right foot: Secondary | ICD-10-CM | POA: Diagnosis not present

## 2021-12-23 DIAGNOSIS — M205X1 Other deformities of toe(s) (acquired), right foot: Secondary | ICD-10-CM | POA: Diagnosis not present

## 2021-12-23 DIAGNOSIS — M7741 Metatarsalgia, right foot: Secondary | ICD-10-CM | POA: Diagnosis not present

## 2021-12-23 DIAGNOSIS — M2041 Other hammer toe(s) (acquired), right foot: Secondary | ICD-10-CM | POA: Diagnosis not present

## 2021-12-23 DIAGNOSIS — M89771 Major osseous defect, right ankle and foot: Secondary | ICD-10-CM | POA: Diagnosis not present

## 2021-12-23 DIAGNOSIS — G8918 Other acute postprocedural pain: Secondary | ICD-10-CM | POA: Diagnosis not present

## 2021-12-23 MED ORDER — HYDROCODONE-ACETAMINOPHEN 7.5-325 MG PO TABS: 1.0000 | ORAL_TABLET | ORAL | 0 refills | Status: AC | PRN

## 2021-12-23 MED ORDER — RIVAROXABAN 10 MG PO TABS: 10.0000 mg | ORAL_TABLET | Freq: Every day | ORAL | 0 refills | Status: DC

## 2021-12-23 MED ORDER — GABAPENTIN 300 MG PO CAPS: 300.0000 mg | ORAL_CAPSULE | Freq: Three times a day (TID) | ORAL | 0 refills | Status: DC

## 2021-12-23 NOTE — Progress Notes (Signed)
12/23/21 lapidus bunionectomy, Weil osteotomy and hammertoe correction, bone graft from heel

## 2021-12-26 ENCOUNTER — Telehealth: Payer: Self-pay | Admitting: *Deleted

## 2021-12-26 NOTE — Telephone Encounter (Signed)
Patient is asking for something a little stronger for the pain, had surgery on Friday and has been doing everything instructed but medication is not helping with pain. Please advise.  Patient has been updated per physician's message ,verbalized understanding,will call back if no improvements.

## 2021-12-29 ENCOUNTER — Ambulatory Visit (INDEPENDENT_AMBULATORY_CARE_PROVIDER_SITE_OTHER): Payer: BC Managed Care – PPO | Admitting: Podiatry

## 2021-12-29 ENCOUNTER — Ambulatory Visit (INDEPENDENT_AMBULATORY_CARE_PROVIDER_SITE_OTHER): Payer: BC Managed Care – PPO

## 2021-12-29 VITALS — BP 116/70 | HR 68

## 2021-12-29 DIAGNOSIS — M21619 Bunion of unspecified foot: Secondary | ICD-10-CM

## 2021-12-29 DIAGNOSIS — M21611 Bunion of right foot: Secondary | ICD-10-CM

## 2022-01-02 ENCOUNTER — Encounter: Payer: BC Managed Care – PPO | Admitting: Podiatry

## 2022-01-02 NOTE — Progress Notes (Signed)
  Subjective:  Patient ID: Suzanne Jackson, female    DOB: Jul 19, 1970,  MRN: [id number]  Chief Complaint  Patient presents with   Routine Post Op    POV #1 DOS 12/23/2021 BUNION CORRECTION OF RT FOOT W/LAPIDUS PROCEDURES, POSS AIKEN, 2ND HAMMERTOE CORRECTION & METATARSAL BONE CUT, BONE GRAFT FROM HEEL    51 y.o. female returns for post-op check.  She is doing well she is having some pain as expected  Review of Systems: Negative except as noted in the HPI. Denies N/V/F/Ch.   Objective:   Vitals:   12/29/21 1522  BP: 116/70  Pulse: 68   There is no height or weight on file to calculate BMI. Constitutional Well developed. Well nourished.  Vascular Foot warm and well perfused. Capillary refill normal to all digits.  Calf is soft and supple, no posterior calf or knee pain, negative Homans' sign  Neurologic Normal speech. Oriented to person, place, and time. Epicritic sensation to light touch grossly present bilaterally.  Dermatologic Skin healing well without signs of infection. Skin edges well coapted without signs of infection.  Orthopedic: Tenderness to palpation noted about the surgical site.   Multiple view plain film radiographs: Hardware intact and in position good frontal and transverse plane correction there is some elevatus on the sagittal view Assessment:   1. Bunion    Plan:  Patient was evaluated and treated and all questions answered.  S/p foot surgery right -Progressing as expected post-operatively. -XR: Noted above there is some elevatus, she was not putting her full weight on the foot we will reassess at her next radiographs -WB Status: NWB in cam walker boot -Sutures: Return for 2 weeks for removal. -Medications: No refills required -Foot redressed.   No follow-ups on file.

## 2022-01-12 ENCOUNTER — Encounter: Payer: BC Managed Care – PPO | Admitting: Podiatry

## 2022-01-17 ENCOUNTER — Telehealth: Payer: Self-pay | Admitting: *Deleted

## 2022-01-17 ENCOUNTER — Encounter: Payer: BC Managed Care – PPO | Admitting: Podiatry

## 2022-01-17 NOTE — Telephone Encounter (Signed)
Open spot, dark yellow substance around surgical area, looks like one of the stitches opened up, hurting quite a bit, one toe is almost purple and swollen. Patient has been scheduled 01/18/22,aware of the [place] location, has confirmed the appointment.

## 2022-01-18 ENCOUNTER — Ambulatory Visit: Payer: BC Managed Care – PPO | Admitting: Podiatry

## 2022-01-18 DIAGNOSIS — L03119 Cellulitis of unspecified part of limb: Secondary | ICD-10-CM

## 2022-01-18 DIAGNOSIS — L02619 Cutaneous abscess of unspecified foot: Secondary | ICD-10-CM | POA: Diagnosis not present

## 2022-01-18 DIAGNOSIS — T8149XA Infection following a procedure, other surgical site, initial encounter: Secondary | ICD-10-CM

## 2022-01-18 MED ORDER — DOXYCYCLINE HYCLATE 100 MG PO TABS: 100.0000 mg | ORAL_TABLET | Freq: Two times a day (BID) | ORAL | 0 refills | Status: DC

## 2022-01-18 MED ORDER — MUPIROCIN 2 % EX OINT: 1.0000 | TOPICAL_OINTMENT | Freq: Two times a day (BID) | CUTANEOUS | 2 refills | Status: DC

## 2022-01-18 MED ORDER — CIPROFLOXACIN HCL 500 MG PO TABS: 500.0000 mg | ORAL_TABLET | Freq: Two times a day (BID) | ORAL | 0 refills | Status: AC

## 2022-01-19 NOTE — Progress Notes (Signed)
  Subjective:  Patient ID: Suzanne Jackson, female    DOB: [birth year],  MRN: [id number]  Chief Complaint  Patient presents with   Post-op Problem    Incision is red and inflamed and draining    52 y.o. female returns for post-op check.  She had to reschedule her last appointment she was unable to get here for it.  She noted that the toe became red and swollen purple.  Says she has been on the foot some.  Review of Systems: Negative except as noted in the HPI. Denies N/V/F/Ch.   Objective:   She is afebrile  Constitutional Well developed. Well nourished.  Vascular Foot warm and well perfused. Capillary refill normal to all digits.  Calf is soft and supple, no posterior calf or knee pain, negative Homans' sign  Neurologic Normal speech. Oriented to person, place, and time. Epicritic sensation to light touch grossly present bilaterally.  Dermatologic Partial dehiscence with fibrous wound bed on the proximal portion of the incision, it erythema extending approximately and radiating 3 cm from this.  No ascending cellulitis or lymphangitis.  No purulent drainage or malodor.  No exposed hardware.  Erythema and swelling of the second toe.  Moderate edema  Orthopedic: Tenderness to palpation noted about the surgical site.   Multiple view plain film radiographs: Hardware intact and in position good frontal and transverse plane correction there is some elevatus on the sagittal view Assessment:   1. Cellulitis and abscess of foot, except toes   2. Surgical site infection    Plan:  Patient was evaluated and treated and all questions answered.  S/p foot surgery right -Unfortunately has developed a surgical site infection I have placed her on ciprofloxacin and doxycycline.  Culture of the drainage was taken today.  I also recommended lab work to evaluate as well.  She will get this at [facility] today.  I will see her back in 1 week for new x-rays.  The remaining nonabsorbable sutures were removed  today.  She will change dressing daily with mupirocin ointment and Rx for this was also sent.  Advised in signs and symptoms of worsening infection including nausea chills fever vomiting or worsening of cellulitis and to report to the emergency room if any of this develops.  Return in about 1 week (around 01/25/2022) for post op (new x-rays).

## 2022-01-23 LAB — WOUND CULTURE: Organism ID, Bacteria: NONE SEEN

## 2022-01-26 ENCOUNTER — Ambulatory Visit (INDEPENDENT_AMBULATORY_CARE_PROVIDER_SITE_OTHER): Payer: BC Managed Care – PPO | Admitting: Podiatry

## 2022-01-26 ENCOUNTER — Ambulatory Visit (INDEPENDENT_AMBULATORY_CARE_PROVIDER_SITE_OTHER): Payer: BC Managed Care – PPO

## 2022-01-26 DIAGNOSIS — M21619 Bunion of unspecified foot: Secondary | ICD-10-CM

## 2022-01-26 DIAGNOSIS — M2041 Other hammer toe(s) (acquired), right foot: Secondary | ICD-10-CM

## 2022-01-29 ENCOUNTER — Other Ambulatory Visit: Payer: Self-pay | Admitting: Podiatry

## 2022-01-30 NOTE — Progress Notes (Signed)
  Subjective:  Patient ID: Suzanne Jackson, female    DOB: 1970-10-13,  MRN: [id number]  Chief Complaint  Patient presents with   Routine Post Op    POV #3 DOS 12/23/2021 BUNION CORRECTION OF RT FOOT W/LAPIDUS PROCEDURES, POSS AIKEN, 2ND HAMMERTOE CORRECTION & METATARSAL BONE CUT, BONE GRAFT FROM HEEL    52 y.o. female returns for post-op check.  She says it is doing much better  Review of Systems: Negative except as noted in the HPI. Denies N/V/F/Ch.   Objective:   She is afebrile  Constitutional Well developed. Well nourished.  Vascular Foot warm and well perfused. Capillary refill normal to all digits.  Calf is soft and supple, no posterior calf or knee pain, negative Homans' sign  Neurologic Normal speech. Oriented to person, place, and time. Epicritic sensation to light touch grossly present bilaterally.  Dermatologic Erythema much improved, delayed healing area has reduced in size, no drainage  Orthopedic: Tenderness to palpation noted about the surgical site.     Multiple view plain film radiographs: Hardware intact and in position good frontal and transverse plane correction there is some elevatus on the sagittal view, increased consolidation across arthrodesis sites, there is some partial fracture of the Weil osteotomy site Assessment:   1. Bunion   2. Hammertoe of second toe of right foot    Plan:  Patient was evaluated and treated and all questions answered.  S/p foot surgery right -She is significantly improved from an infection standpoint, she does have delayed healing of her osteotomy and bone healing with some loss of correction and increased dorsiflexion of the first ray.  I advised her to limit her weightbearing continue in the cam boot.  Kirschner wires removed uneventfully today.  I will see her back in 2 weeks for follow-up on her delayed healing  Return in about 2 weeks (around 02/09/2022) for POV wound check .

## 2022-02-02 ENCOUNTER — Encounter: Payer: BC Managed Care – PPO | Admitting: Podiatry

## 2022-02-07 ENCOUNTER — Encounter: Payer: BC Managed Care – PPO | Admitting: Podiatry

## 2022-02-09 ENCOUNTER — Ambulatory Visit (INDEPENDENT_AMBULATORY_CARE_PROVIDER_SITE_OTHER): Payer: BC Managed Care – PPO | Admitting: Podiatry

## 2022-02-09 DIAGNOSIS — T8189XD Other complications of procedures, not elsewhere classified, subsequent encounter: Secondary | ICD-10-CM

## 2022-02-13 NOTE — Progress Notes (Signed)
  Subjective:  Patient ID: Suzanne Jackson, female    DOB: August 18, 1970,  MRN: [id number]  Chief Complaint  Patient presents with   Routine Post Op    WOUND CHECK/ POV #4 DOS 12/23/2021 BUNION CORRECTION OF RT FOOT W/LAPIDUS PROCEDURES, POSS AIKEN, 2ND HAMMERTOE CORRECTION & METATARSAL BONE CUT, BONE GRAFT FROM HEEL    52 y.o. female returns for post-op check.  She says it is doing much better  Review of Systems: Negative except as noted in the HPI. Denies N/V/F/Ch.   Objective:   She is afebrile  Constitutional Well developed. Well nourished.  Vascular Foot warm and well perfused. Capillary refill normal to all digits.  Calf is soft and supple, no posterior calf or knee pain, negative Homans' sign  Neurologic Normal speech. Oriented to person, place, and time. Epicritic sensation to light touch grossly present bilaterally.  Dermatologic No erythema today, very small area of scab left no drainage  Orthopedic: She has little to no tenderness to palpation noted about the surgical site.     Multiple view plain film radiographs: Hardware intact and in position good frontal and transverse plane correction there is some elevatus on the sagittal view, increased consolidation across arthrodesis sites, there is some partial fracture of the Weil osteotomy site Assessment:   1. Delayed surgical wound healing, subsequent encounter    Plan:  Patient was evaluated and treated and all questions answered.  S/p foot surgery right -Doing much better nearly fully healed.  She will continue using the cam boot for the next 2 weeks and we will take new radiographs at hopefully transition to a surgical shoe at that point  Return in about 2 weeks (around 02/23/2022) for post op (new x-rays).

## 2022-02-21 ENCOUNTER — Ambulatory Visit (INDEPENDENT_AMBULATORY_CARE_PROVIDER_SITE_OTHER): Payer: BC Managed Care – PPO | Admitting: Podiatry

## 2022-02-21 ENCOUNTER — Ambulatory Visit (INDEPENDENT_AMBULATORY_CARE_PROVIDER_SITE_OTHER): Payer: BC Managed Care – PPO

## 2022-02-21 DIAGNOSIS — M21619 Bunion of unspecified foot: Secondary | ICD-10-CM | POA: Diagnosis not present

## 2022-02-21 DIAGNOSIS — M21611 Bunion of right foot: Secondary | ICD-10-CM

## 2022-02-21 DIAGNOSIS — T8189XD Other complications of procedures, not elsewhere classified, subsequent encounter: Secondary | ICD-10-CM

## 2022-02-21 DIAGNOSIS — M2041 Other hammer toe(s) (acquired), right foot: Secondary | ICD-10-CM

## 2022-02-22 ENCOUNTER — Telehealth: Payer: Self-pay

## 2022-02-24 NOTE — Telephone Encounter (Signed)
I can have an extra small shoe here next week and we can call her when it comes in... if you'll make yourself a note

## 2022-02-26 ENCOUNTER — Encounter: Payer: Self-pay | Admitting: Podiatry

## 2022-02-26 NOTE — Progress Notes (Signed)
  Subjective:  Patient ID: JESSCIA IMM, female    DOB: 1970-08-15,  MRN: [id number]  Chief Complaint  Patient presents with   Routine Post Op    XRAY POV #5 DOS 12/23/2021 BUNION CORRECTION OF RT FOOT W/LAPIDUS PROCEDURES, POSS AIKEN, 2ND HAMMERTOE CORRECTION & METATARSAL BONE CUT, BONE GRAFT FROM HEEL    52 y.o. female returns for post-op check.  She says it is doing much better once again feels that it is improving  Review of Systems: Negative except as noted in the HPI. Denies N/V/F/Ch.   Objective:   She is afebrile  Constitutional Well developed. Well nourished.  Vascular Foot warm and well perfused. Capillary refill normal to all digits.  Calf is soft and supple, no posterior calf or knee pain, negative Homans' sign  Neurologic Normal speech. Oriented to person, place, and time. Epicritic sensation to light touch grossly present bilaterally.  Dermatologic Incision is fully healed no signs of infection  Orthopedic: She has little to no tenderness to palpation noted about the surgical site.  Mild edema     Multiple view plain film radiographs: Hardware intact and in position good frontal and transverse plane correction there is some elevatus on the sagittal view, increased consolidation across arthrodesis sites, there is some partial fracture of the Weil osteotomy site, bone callus formation around this notable Assessment:   1. Bunion   2. Hammertoe of second toe of right foot   3. Delayed surgical wound healing, subsequent encounter    Plan:  Patient was evaluated and treated and all questions answered.  S/p foot surgery right -Overall doing better.  She may transition from the cam boot to a surgical shoe.  This was dispensed today.  We reviewed her radiographs.  I will see her back in 5 weeks for new radiographs if there is full healing and she is asymptomatic we will transition back to regular shoe gear  Return in about 5 weeks (around 03/28/2022) for post op (new  x-rays).

## 2022-02-28 NOTE — Telephone Encounter (Signed)
Should be sometime this week, but I have not seen them as of yet

## 2022-02-28 NOTE — Telephone Encounter (Signed)
Patient is aware 

## 2022-02-28 NOTE — Telephone Encounter (Signed)
Are the xs surgical shoes here yet before I call the patient to come by the office.

## 2022-03-16 DIAGNOSIS — E876 Hypokalemia: Secondary | ICD-10-CM | POA: Diagnosis not present

## 2022-03-16 DIAGNOSIS — Z13220 Encounter for screening for lipoid disorders: Secondary | ICD-10-CM | POA: Diagnosis not present

## 2022-03-16 DIAGNOSIS — Q453 Other congenital malformations of pancreas and pancreatic duct: Secondary | ICD-10-CM | POA: Diagnosis not present

## 2022-03-16 DIAGNOSIS — F322 Major depressive disorder, single episode, severe without psychotic features: Secondary | ICD-10-CM | POA: Diagnosis not present

## 2022-03-16 DIAGNOSIS — M545 Low back pain, unspecified: Secondary | ICD-10-CM | POA: Diagnosis not present

## 2022-03-16 DIAGNOSIS — I10 Essential (primary) hypertension: Secondary | ICD-10-CM | POA: Diagnosis not present

## 2022-03-28 ENCOUNTER — Ambulatory Visit: Payer: BC Managed Care – PPO | Admitting: Podiatry

## 2022-03-28 ENCOUNTER — Ambulatory Visit (INDEPENDENT_AMBULATORY_CARE_PROVIDER_SITE_OTHER): Payer: BC Managed Care – PPO

## 2022-03-28 DIAGNOSIS — M960 Pseudarthrosis after fusion or arthrodesis: Secondary | ICD-10-CM | POA: Diagnosis not present

## 2022-03-28 DIAGNOSIS — M21619 Bunion of unspecified foot: Secondary | ICD-10-CM

## 2022-03-28 DIAGNOSIS — M21961 Unspecified acquired deformity of right lower leg: Secondary | ICD-10-CM | POA: Diagnosis not present

## 2022-03-28 DIAGNOSIS — M2041 Other hammer toe(s) (acquired), right foot: Secondary | ICD-10-CM

## 2022-03-28 DIAGNOSIS — M21271 Flexion deformity, right ankle and toes: Secondary | ICD-10-CM | POA: Diagnosis not present

## 2022-03-29 ENCOUNTER — Encounter: Payer: Self-pay | Admitting: Podiatry

## 2022-03-29 NOTE — Progress Notes (Signed)
Subjective:  Patient ID: Suzanne Jackson, female    DOB: 01-Jan-1971,  MRN: [id number]  Chief Complaint  Patient presents with   Routine Post Op    XRAY POV #6 DOS 12/23/2021 BUNION CORRECTION OF RT FOOT W/LAPIDUS PROCEDURES, POSS AIKEN, 2ND HAMMERTOE CORRECTION & METATARSAL BONE CUT, BONE GRAFT FROM HEEL    52 y.o. female returns for post-op check.  She now says she is having pain in the foot, the arch and the ankle  Review of Systems: Negative except as noted in the HPI. Denies N/V/F/Ch.   Objective:   She is afebrile  Constitutional Well developed. Well nourished.  Vascular Foot warm and well perfused. Capillary refill normal to all digits.  Calf is soft and supple, no posterior calf or knee pain, negative Homans' sign  Neurologic Normal speech. Oriented to person, place, and time. Epicritic sensation to light touch grossly present bilaterally.  Dermatologic Incision is fully healed no signs of infection  Orthopedic: She has tenderness to palpation around the midfoot and MTPJ as well as the proximal arch.  She has forefoot varus that compensates to hindfoot valgus     Multiple view plain film radiographs: Radiographs taken today show loosening of dorsal plates, worsening elevatus that has increased, subluxation of the first MTPJ, persistent lucency and nonunion at the fusion site Assessment:   1. Metatarsus primus elevatus, right   2. Pseudarthrosis after joint fusion   3. Hammertoe of second toe of right foot   4. Metatarsal deformity, right   5. Bunion    Plan:  Patient was evaluated and treated and all questions answered.  So far is starting to worsen.  Her structural elevatus has increased and she has persistent lucency and nonunion at the fusion site.  I had her stand today and her forefoot varus compensates to hindfoot valgus and long-term I do not think this will be a tenable position for foot function and.  Suspect this could lead to significant hindfoot deformity  and further issues as well as degeneration of the first MTPJ.  I do think she has a nonunion at the fusion site at this point I would like her to begin noninvasive bone marrow stimulation.  A referral was sent for this.  Finally we discussed surgical reconstruction and revision surgery to correct the deformity at the first TMT.  We discussed the risk benefits and potential complications of this procedure including the limited to  pain, swelling, infection, scar, numbness which may be temporary or permanent, chronic pain, stiffness, nerve pain or damage, wound healing problems, bone healing problems including delayed or non-union I discussed with her that she would not be able to ambulate early on this and will be nonweightbearing for 4 to 6 weeks utilizing the knee scooter.  We also discussed that her significant smoking history is likely the main contributing factor as well as early ambulation that has led to this outcome and she should stop or cease smoking if at all possible for the revision, I discussed with her even if that means utilizing nicotine replacement therapy that would be better than smoking.  All questions were addressed.  She understands and wishes to proceed.  Informed consent was signed and reviewed.   Surgical plan:  Procedure: -Revision of Lapidus bunionectomy nonunion of right foot  Location: -[facility]  Anesthesia plan: -IV sedation with regional block  Postoperative pain plan: - Tylenol 1000 mg every 6 hours, ibuprofen 600 mg every 6 hours, gabapentin 300 mg every 8 hours x5  days, oxycodone 5 mg 1-2 tabs every 6 hours only as needed  DVT prophylaxis: -Xarelto 10 mg nightly  WB Restrictions / DME needs: -NWB in knee scooter postop, she has this already     No follow-ups on file.

## 2022-04-05 ENCOUNTER — Telehealth: Payer: Self-pay | Admitting: Urology

## 2022-04-05 NOTE — Telephone Encounter (Signed)
DOS - 04/28/22  ARTHRODESIS LIS [facility] RIGHT --- [phone]  [employer]   SPOKE WITH TOSHA WITH [employer] AND SHE STATED THAT FOR CPT CODE 82956 NO PRIOR AUTH IS REQUIRED.  CALL REF # [id number] P. 04/05/22 AT 9:26 AM EST

## 2022-04-19 DIAGNOSIS — R197 Diarrhea, unspecified: Secondary | ICD-10-CM | POA: Diagnosis not present

## 2022-04-19 DIAGNOSIS — K625 Hemorrhage of anus and rectum: Secondary | ICD-10-CM | POA: Diagnosis not present

## 2022-04-21 DIAGNOSIS — R197 Diarrhea, unspecified: Secondary | ICD-10-CM | POA: Diagnosis not present

## 2022-04-24 ENCOUNTER — Telehealth: Payer: Self-pay

## 2022-04-24 NOTE — Telephone Encounter (Signed)
Suzanne Jackson is scheduled for surgery on Friday, 04/28/2022. She has tested positive for C-Diff and will be starting a 14 day antibiotic today. Will her surgery need to be rescheduled?

## 2022-05-03 ENCOUNTER — Encounter: Payer: BC Managed Care – PPO | Admitting: Podiatry

## 2022-05-15 ENCOUNTER — Other Ambulatory Visit: Payer: Self-pay | Admitting: Podiatry

## 2022-05-15 DIAGNOSIS — M216X1 Other acquired deformities of right foot: Secondary | ICD-10-CM | POA: Diagnosis not present

## 2022-05-15 DIAGNOSIS — M21961 Unspecified acquired deformity of right lower leg: Secondary | ICD-10-CM | POA: Diagnosis not present

## 2022-05-15 DIAGNOSIS — Z981 Arthrodesis status: Secondary | ICD-10-CM | POA: Diagnosis not present

## 2022-05-15 DIAGNOSIS — G8918 Other acute postprocedural pain: Secondary | ICD-10-CM | POA: Diagnosis not present

## 2022-05-15 DIAGNOSIS — M89771 Major osseous defect, right ankle and foot: Secondary | ICD-10-CM | POA: Diagnosis not present

## 2022-05-15 MED ORDER — HYDROCODONE-ACETAMINOPHEN 7.5-325 MG PO TABS: 1.0000 | ORAL_TABLET | ORAL | 0 refills | Status: AC | PRN

## 2022-05-15 MED ORDER — RIVAROXABAN 10 MG PO TABS: 10.0000 mg | ORAL_TABLET | Freq: Every day | ORAL | 0 refills | Status: DC

## 2022-05-15 MED ORDER — GABAPENTIN 300 MG PO CAPS: 300.0000 mg | ORAL_CAPSULE | Freq: Three times a day (TID) | ORAL | 0 refills | Status: DC

## 2022-05-15 NOTE — Progress Notes (Signed)
05/15/22 revision of Lapidus

## 2022-05-16 ENCOUNTER — Telehealth: Payer: Self-pay | Admitting: *Deleted

## 2022-05-16 MED ORDER — OXYCODONE-ACETAMINOPHEN 10-325 MG PO TABS: 1.0000 | ORAL_TABLET | ORAL | 0 refills | Status: AC | PRN

## 2022-05-16 NOTE — Telephone Encounter (Signed)
Patient is calling to request another medication ,has been taking hydrocodone since 2010,has built up a tolerance and is in a lot of pain, please advise.

## 2022-05-16 NOTE — Telephone Encounter (Signed)
Patient also having severe pain and hydrocodone is not providing much relief, we discussed alternate pain management, she says she has taken Percocet before after her back surgery, does have a listed allergy for this but is for itching, she says she takes hydroxyzine when this happens and it does not give her any problems.  Has never had any angioedema or anaphylaxis from it.  Rx for Percocet sent to pharmacy advised not to take in addition to hydrocodone she should stop hydrocodone for now.  Also advise she may loosen the Ace wrap and should continue to ice and elevate is much as possible.

## 2022-05-17 ENCOUNTER — Encounter: Payer: BC Managed Care – PPO | Admitting: Podiatry

## 2022-05-23 ENCOUNTER — Ambulatory Visit (INDEPENDENT_AMBULATORY_CARE_PROVIDER_SITE_OTHER): Payer: BC Managed Care – PPO

## 2022-05-23 ENCOUNTER — Ambulatory Visit (INDEPENDENT_AMBULATORY_CARE_PROVIDER_SITE_OTHER): Payer: BC Managed Care – PPO | Admitting: Podiatry

## 2022-05-23 DIAGNOSIS — M960 Pseudarthrosis after fusion or arthrodesis: Secondary | ICD-10-CM

## 2022-05-23 DIAGNOSIS — M21271 Flexion deformity, right ankle and toes: Secondary | ICD-10-CM

## 2022-05-23 MED ORDER — CEPHALEXIN 500 MG PO CAPS: 500.0000 mg | ORAL_CAPSULE | Freq: Three times a day (TID) | ORAL | 0 refills | Status: DC

## 2022-05-28 NOTE — Progress Notes (Signed)
  Subjective:  Patient ID: Suzanne Jackson, female    DOB: 1970-06-24,  MRN: [id number]  Chief Complaint  Patient presents with   Routine Post Op    POV # 1 DOS 05/15/22 --- REVISION OF LAPIDUS RIGHT FOOT WITH BONE GRAFT AND MARROW ASPIRATION      52 y.o. female returns for post-op check.  She states she is doing okay she has been using a knee scooter.  Smoking about 15 cigarettes/day.  Has   not change dressing.  Review of Systems: Negative except as noted in the HPI. Denies N/V/F/Ch.   Objective:  There were no vitals filed for this visit. There is no height or weight on file to calculate BMI. Constitutional Well developed. Well nourished.  Vascular Foot warm and well perfused. Capillary refill normal to all digits.  Calf is soft and supple, no posterior calf or knee pain, negative Homans' sign  Neurologic Normal speech. Oriented to person, place, and time. Epicritic sensation to light touch grossly present bilaterally.  Dermatologic Incision well coapted, there is maceration.  Orthopedic: Moderate edema.  Tenderness to palpation noted about the surgical site.   Multiple view plain film radiographs: Good correction noted, all hardware intact and in good positioning Assessment:   1. Metatarsus primus elevatus, right   2. Pseudarthrosis after joint fusion    Plan:  Patient was evaluated and treated and all questions answered.  S/p foot surgery right -Progressing as expected post-operatively. -Advised to limit smoking -Continue nonweightbearing in cam boot.  May remove boot only to change dressing -Change dressing daily and apply Betadine to incision to control maceration and drainage with gauze -XR: Noted above -Plan remove sutures in 2 weeks -Keflex 500 mg sent to pharmacy as precaution considering maceration, no active signs of infection -CAM Walker boot was dispensed today -Foot redressed. -Begin bone marrow stimulation use 10 hours/day  No follow-ups on file.

## 2022-05-29 ENCOUNTER — Encounter: Payer: Self-pay | Admitting: Podiatry

## 2022-05-29 ENCOUNTER — Ambulatory Visit (INDEPENDENT_AMBULATORY_CARE_PROVIDER_SITE_OTHER): Payer: BC Managed Care – PPO | Admitting: Podiatry

## 2022-05-29 ENCOUNTER — Ambulatory Visit: Payer: BC Managed Care – PPO

## 2022-05-29 DIAGNOSIS — T8189XD Other complications of procedures, not elsewhere classified, subsequent encounter: Secondary | ICD-10-CM

## 2022-05-29 DIAGNOSIS — Z9889 Other specified postprocedural states: Secondary | ICD-10-CM

## 2022-05-29 DIAGNOSIS — R52 Pain, unspecified: Secondary | ICD-10-CM

## 2022-05-29 MED ORDER — DOXYCYCLINE HYCLATE 100 MG PO TABS: 100.0000 mg | ORAL_TABLET | Freq: Two times a day (BID) | ORAL | 0 refills | Status: AC

## 2022-05-29 NOTE — Progress Notes (Signed)
  Subjective:  Patient ID: Suzanne Jackson, female    DOB: Sep 12, 1970,  MRN: [id number]  Chief Complaint  Patient presents with   Foot Problem    Patient has a possible infection in the right mid foot, patient stated that she's seen mcdonald on her 1st follow up and has been following all instructions but has noticed a pus-like drainage, patient states that the site keeps opening up, minimal, has been prescribed a mupirocin cream       52 y.o. female returns for post-op check. Here because she has concern for infection of her foot. Relates she has been putting mupirocin on the area. Relates the whole incision has opened up and has more redness around.   Smoking about 15 cigarettes/day.  Has   not change dressing.  Review of Systems: Negative except as noted in the HPI. Denies N/V/F/Ch.   Objective:  There were no vitals filed for this visit. There is no height or weight on file to calculate BMI. Constitutional Well developed. Well nourished.  Vascular Foot warm and well perfused. Capillary refill normal to all digits.  Calf is soft and supple, no posterior calf or knee pain, negative Homans' sign  Neurologic Normal speech. Oriented to person, place, and time. Epicritic sensation to light touch grossly present bilaterally.  Dermatologic Incision dehisced with fibrinous necrotic tissue present. Plates not visibile and no probe to bone. No purulence noted. Mild erythema surrounding. Lateral incision with hemotoma and dehiscence as well.   Orthopedic: Moderate edema.  Tenderness to palpation noted about the surgical site.     Multiple view plain film radiographs: Good correction noted, all hardware intact and in good positioning Assessment:   1. Post-operative state   2. Delayed surgical wound healing, subsequent encounter     Plan:  Patient was evaluated and treated and all questions answered.  S/p foot surgery right -Advised to limit smoking -Continue nonweightbearing in cam  boot.  May remove boot only to change dressing -Change dressing daily and apply Betadine to incision to control maceration and drainage with betadine soaked gauze -XR: Noted above -Suture removed as provided no support.  -Doxycycline sent to pharmacy,.  -Continue CAM boot.  -Foot redressed. -Begin bone marrow stimulation use 10 hours/day  Follow-up in one week with Dr. Lilian Kapur. Advised if any worsening redness drainage pain nausea vomiting or fever to report to the ED.    Return in about 1 week (around 06/05/2022) for post op.

## 2022-06-04 ENCOUNTER — Other Ambulatory Visit: Payer: Self-pay | Admitting: Podiatry

## 2022-06-06 ENCOUNTER — Telehealth: Payer: Self-pay | Admitting: Urology

## 2022-06-06 ENCOUNTER — Ambulatory Visit (INDEPENDENT_AMBULATORY_CARE_PROVIDER_SITE_OTHER): Payer: BC Managed Care – PPO | Admitting: Podiatry

## 2022-06-06 DIAGNOSIS — T8131XA Disruption of external operation (surgical) wound, not elsewhere classified, initial encounter: Secondary | ICD-10-CM

## 2022-06-06 MED ORDER — GENTAMICIN SULFATE 0.1 % EX OINT: 1.0000 | TOPICAL_OINTMENT | Freq: Three times a day (TID) | CUTANEOUS | 0 refills | Status: DC

## 2022-06-06 MED ORDER — DOXYCYCLINE HYCLATE 100 MG PO TABS: 100.0000 mg | ORAL_TABLET | Freq: Two times a day (BID) | ORAL | 0 refills | Status: DC

## 2022-06-06 MED ORDER — GENTAMICIN SULFATE 0.1 % EX OINT: 1.0000 | TOPICAL_OINTMENT | Freq: Every day | CUTANEOUS | 0 refills | Status: DC

## 2022-06-06 NOTE — Telephone Encounter (Signed)
DOS - 06/09/22  [id number]  [employer] EFFECTIVE DATE - 01/16/22  DEDUCTIBLE - $2,000.00 W/ $0.00 REMIANING OOP - $6,250.00 W/ $3,356.28 REMAINING COINSURANCE - 20%  SPOKE WITH JOCELYN WITH [employer] AND SHE STATED THAT FOR CPT CODES 16109 AND 15275 NO PRIOR AUTH IS REQUIRED.  CALL REF # JOCELYN B. 06/06/22 AT 2:15 PM EST

## 2022-06-06 NOTE — Progress Notes (Addendum)
  Subjective:  Patient ID: Suzanne Jackson, female    DOB: [birth year],  MRN: [id number]  Chief Complaint  Patient presents with   Routine Post Op    POV # 2 DOS 05/15/22 --- REVISION OF [place] RIGHT FOOT WITH BONE GRAFT AND MARROW ASPIRATION - wound care,  surgical site open      52 y.o. female returns for post-op check.  She had return last week for an urgent visit had a wound opening up, Dr. Ralene Cork placed her on doxycycline  Review of Systems: Negative except as noted in the HPI. Denies N/V/F/Ch.   Objective:  There were no vitals filed for this visit. There is no height or weight on file to calculate BMI. Constitutional Well developed. Well nourished.  Vascular Foot warm and well perfused. Capillary refill normal to all digits.  Calf is soft and supple, no posterior calf or knee pain, negative Homans' sign  Neurologic Normal speech. Oriented to person, place, and time. Epicritic sensation to light touch grossly present bilaterally.  Dermatologic Gross dehiscence of both incisions, there is exposed tendon  Orthopedic: Moderate edema.  Tenderness to palpation noted about the surgical site.     Multiple view plain film radiographs: Good correction noted, all hardware intact and in good positioning  Assessment:   1. Dehiscence of operative wound, initial encounter    Plan:  Patient was evaluated and treated and all questions answered.  S/p foot surgery right Unfortunately has a severe dehiscence of the wound with exposed tendinous structures.  I recommend that she have debridement of this and application of a skin substitute with a bilayer wound matrix and a wound VAC.  She is at high risk of complication of further infection and limb loss due to this.  Advised it is critical that she stop smoking will reduce this.  Gentamicin ointment Rx sent to pharmacy apply this daily and change daily.  Continue doxycycline and I sent an extension of this.  Informed consent signed and  reviewed for surgery for debridement, skin substitute application and wound VAC application, will be able to do this as an outpatient advised her on worsening signs and symptoms of infection if any of these develop she should proceed immediately to the emergency room.  Her foot has good capillary fill and a palpable DP pulse, I have recommended evaluating with noninvasive vascular testing as well considering her smoking history and tissue loss  No follow-ups on file.

## 2022-06-06 NOTE — Patient Instructions (Signed)
DUE TO COVID-19 ONLY TWO VISITORS  (aged 53 and older)  ARE ALLOWED TO COME WITH YOU AND STAY IN THE WAITING ROOM ONLY DURING PRE OP AND PROCEDURE.   **NO VISITORS ARE ALLOWED IN THE SHORT STAY AREA OR RECOVERY ROOM!!**  IF YOU WILL BE ADMITTED INTO THE HOSPITAL YOU ARE ALLOWED ONLY FOUR SUPPORT PEOPLE DURING VISITATION HOURS ONLY (7 AM -8PM)   The support person(s) must pass our screening, gel in and out, and wear a mask at all times, including in the patient's room. Patients must also wear a mask when staff or their support person are in the room. Visitors GUEST BADGE MUST BE WORN VISIBLY  One adult visitor may remain with you overnight and MUST be in the room by 8 P.M.     Your procedure is scheduled on: 06/09/22   Report to [facility] Main Entrance    Report to admitting at : 1:45 PM   Call this number if you have problems the morning of surgery [phone]   Do not eat food :After Midnight.   After Midnight you may have the following liquids until: 1:00  PM DAY OF SURGERY  Water Black Coffee (sugar ok, NO MILK/CREAM OR CREAMERS)  Tea (sugar ok, NO MILK/CREAM OR CREAMERS) regular and decaf                             Plain Jell-O (NO RED)                                           Fruit ices (not with fruit pulp, NO RED)                                     Popsicles (NO RED)                                                                  Juice: apple, WHITE grape, WHITE cranberry Sports drinks like Gatorade (NO RED)    Oral Hygiene is also important to reduce your risk of infection.                                    Remember - BRUSH YOUR TEETH THE MORNING OF SURGERY WITH YOUR REGULAR TOOTHPASTE  DENTURES WILL BE REMOVED PRIOR TO SURGERY PLEASE DO NOT APPLY "Poly grip" OR ADHESIVES!!!   Do NOT smoke after Midnight   Take these medicines the morning of surgery with A SIP OF WATER:  montelukast,gabapentin,duloxetine,fexofenadine,amlodipine,cephalexin,doxycycline,disulfiram,Hydroxyzine as needed.                              You may not have any metal on your body including hair pins, jewelry, and body piercing             Do not wear make-up, lotions, powders, perfumes/cologne, or deodorant  Do not wear nail polish including gel and S&S, artificial/acrylic  nails, or any other type of covering on natural nails including finger and toenails. If you have artificial nails, gel coating, etc. that needs to be removed by a nail salon please have this removed prior to surgery or surgery may need to be canceled/ delayed if the surgeon/ anesthesia feels like they are unable to be safely monitored.   Do not shave  48 hours prior to surgery.   Do not bring valuables to the hospital. [facility] IS NOT             RESPONSIBLE   FOR VALUABLES.   Contacts, glasses, or bridgework may not be worn into surgery.   Bring small overnight bag day of surgery.   DO NOT BRING YOUR HOME MEDICATIONS TO THE HOSPITAL. PHARMACY WILL DISPENSE MEDICATIONS LISTED ON YOUR MEDICATION LIST TO YOU DURING YOUR ADMISSION IN THE HOSPITAL!    Patients discharged on the day of surgery will not be allowed to drive home.  Someone NEEDS to stay with you for the first 24 hours after anesthesia.   Special Instructions: Bring a copy of your healthcare power of attorney and living will documents         the day of surgery if you haven't scanned them before.              Please read over the following fact sheets you were given: IF YOU HAVE QUESTIONS ABOUT YOUR PRE-OP INSTRUCTIONS PLEASE CALL [phone]    [facility] - Preparing for Surgery Before surgery, you can play an important role.  Because skin is not sterile, your skin needs to be as free of germs as possible.  You can reduce the number of germs on your skin by washing with CHG (chlorahexidine gluconate) soap before surgery.  CHG is an antiseptic cleaner  which kills germs and bonds with the skin to continue killing germs even after washing. Please DO NOT use if you have an allergy to CHG or antibacterial soaps.  If your skin becomes reddened/irritated stop using the CHG and inform your nurse when you arrive at Short Stay. Do not shave (including legs and underarms) for at least 48 hours prior to the first CHG shower.  You may shave your face/neck. Please follow these instructions carefully:  1.  Shower with CHG Soap the night before surgery and the  morning of Surgery.  2.  If you choose to wash your hair, wash your hair first as usual with your  normal  shampoo.  3.  After you shampoo, rinse your hair and body thoroughly to remove the  shampoo.                           4.  Use CHG as you would any other liquid soap.  You can apply chg directly  to the skin and wash                       Gently with a scrungie or clean washcloth.  5.  Apply the CHG Soap to your body ONLY FROM THE NECK DOWN.   Do not use on face/ open                           Wound or open sores. Avoid contact with eyes, ears mouth and genitals (private parts).  Wash face,  Genitals (private parts) with your normal soap.             6.  Wash thoroughly, paying special attention to the area where your surgery  will be performed.  7.  Thoroughly rinse your body with warm water from the neck down.  8.  DO NOT shower/wash with your normal soap after using and rinsing off  the CHG Soap.                9.  Pat yourself dry with a clean towel.            10.  Wear clean pajamas.            11.  Place clean sheets on your bed the night of your first shower and do not  sleep with pets. Day of Surgery : Do not apply any lotions/deodorants the morning of surgery.  Please wear clean clothes to the hospital/surgery center.  FAILURE TO FOLLOW THESE INSTRUCTIONS MAY RESULT IN THE CANCELLATION OF YOUR SURGERY PATIENT SIGNATURE_________________________________  NURSE  SIGNATURE__________________________________  ________________________________________________________________________

## 2022-06-07 ENCOUNTER — Other Ambulatory Visit: Payer: Self-pay

## 2022-06-07 ENCOUNTER — Encounter (HOSPITAL_COMMUNITY)
Admission: RE | Admit: 2022-06-07 | Discharge: 2022-06-07 | Disposition: A | Discharge status: Home / Self Care | Payer: BC Managed Care – PPO | Source: Ambulatory Visit | Attending: Podiatry | Admitting: Podiatry

## 2022-06-07 ENCOUNTER — Encounter (HOSPITAL_COMMUNITY): Payer: Self-pay

## 2022-06-07 VITALS — BP 140/89 | HR 93 | Temp 98.2°F | Ht 61.0 in | Wt 118.0 lb

## 2022-06-07 DIAGNOSIS — Z01818 Encounter for other preprocedural examination: Secondary | ICD-10-CM | POA: Insufficient documentation

## 2022-06-07 DIAGNOSIS — T8141XA Infection following a procedure, superficial incisional surgical site, initial encounter: Secondary | ICD-10-CM | POA: Diagnosis not present

## 2022-06-07 DIAGNOSIS — I517 Cardiomegaly: Secondary | ICD-10-CM | POA: Insufficient documentation

## 2022-06-07 DIAGNOSIS — Z79899 Other long term (current) drug therapy: Secondary | ICD-10-CM | POA: Diagnosis not present

## 2022-06-07 DIAGNOSIS — F419 Anxiety disorder, unspecified: Secondary | ICD-10-CM | POA: Diagnosis not present

## 2022-06-07 DIAGNOSIS — T8131XA Disruption of external operation (surgical) wound, not elsewhere classified, initial encounter: Secondary | ICD-10-CM | POA: Diagnosis not present

## 2022-06-07 DIAGNOSIS — Y839 Surgical procedure, unspecified as the cause of abnormal reaction of the patient, or of later complication, without mention of misadventure at the time of the procedure: Secondary | ICD-10-CM | POA: Diagnosis not present

## 2022-06-07 DIAGNOSIS — I10 Essential (primary) hypertension: Secondary | ICD-10-CM

## 2022-06-07 DIAGNOSIS — F32A Depression, unspecified: Secondary | ICD-10-CM | POA: Diagnosis not present

## 2022-06-07 DIAGNOSIS — J449 Chronic obstructive pulmonary disease, unspecified: Secondary | ICD-10-CM | POA: Diagnosis not present

## 2022-06-07 DIAGNOSIS — R9431 Abnormal electrocardiogram [ECG] [EKG]: Secondary | ICD-10-CM | POA: Insufficient documentation

## 2022-06-07 DIAGNOSIS — F17200 Nicotine dependence, unspecified, uncomplicated: Secondary | ICD-10-CM | POA: Diagnosis not present

## 2022-06-07 LAB — BASIC METABOLIC PANEL
Anion gap: 10 (ref 5–15)
BUN: 12 mg/dL (ref 6–20)
CO2: 24 mmol/L (ref 22–32)
Calcium: 9.6 mg/dL (ref 8.9–10.3)
Chloride: 101 mmol/L (ref 98–111)
Creatinine, Ser: 0.58 mg/dL (ref 0.44–1.00)
GFR, Estimated: >60 mL/min (ref >60)
Glucose, Bld: 104 mg/dL — ABNORMAL HIGH (ref 70–99)
Potassium: 3.7 mmol/L (ref 3.5–5.1)
Sodium: 135 mmol/L (ref 135–145)

## 2022-06-07 LAB — CBC
HCT: 44.2 % (ref 36.0–46.0)
Hemoglobin: 14.7 g/dL (ref 12.0–15.0)
MCH: 34.3 pg — ABNORMAL HIGH (ref 26.0–34.0)
MCHC: 33.3 g/dL (ref 30.0–36.0)
MCV: 103 fL — ABNORMAL HIGH (ref 80.0–100.0)
Platelets: 381 10*3/uL (ref 150–400)
RBC: 4.29 MIL/uL (ref 3.87–5.11)
RDW: 14.1 % (ref 11.5–15.5)
WBC: 11.4 10*3/uL — ABNORMAL HIGH (ref 4.0–10.5)
nRBC: 0 % (ref 0.0–0.2)

## 2022-06-07 NOTE — Progress Notes (Signed)
Pt. Needs H & P and orders for surgery.

## 2022-06-07 NOTE — Progress Notes (Addendum)
For Short Stay: COVID SWAB appointment date:  Bowel Prep reminder:   For Anesthesia: PCP - Dr. Lupe Carney. Appointment on: 06/08/22 Cardiologist - N/A  Chest x-ray -  EKG - 06/07/22 Stress Test -  ECHO -  Cardiac Cath -  Pacemaker/ICD device last checked: Pacemaker orders received: Device Rep notified:  Spinal Cord Stimulator: N/A  Sleep Study - N/A CPAP -   Fasting Blood Sugar - N/A Checks Blood Sugar _____ times a day Date and result of last Hgb A1c-  Last dose of GLP1 agonist- N/A GLP1 instructions:   Last dose of SGLT-2 inhibitors- N/A SGLT-2 instructions:   Blood Thinner Instructions: Xarelto. Pharmacy recommendations were given to the pt. Also pt. Was advised to call surgeon and ask for instructions. Aspirin Instructions: Last Dose:  Activity level: Can go up a flight of stairs and activities of daily living without stopping and without chest pain and/or shortness of breath   Able to exercise without chest pain and/or shortness of breath  Anesthesia review: Hx: Smoker,HTN.  Patient denies shortness of breath, fever, cough and chest pain at PAT appointment   Patient verbalized understanding of instructions that were given to them at the PAT appointment. Patient was also instructed that they will need to review over the PAT instructions again at home before surgery.

## 2022-06-08 ENCOUNTER — Encounter: Payer: BC Managed Care – PPO | Admitting: Podiatry

## 2022-06-08 ENCOUNTER — Telehealth: Payer: Self-pay | Admitting: Podiatry

## 2022-06-08 DIAGNOSIS — I10 Essential (primary) hypertension: Secondary | ICD-10-CM | POA: Diagnosis not present

## 2022-06-08 DIAGNOSIS — L02611 Cutaneous abscess of right foot: Secondary | ICD-10-CM | POA: Diagnosis not present

## 2022-06-08 DIAGNOSIS — F322 Major depressive disorder, single episode, severe without psychotic features: Secondary | ICD-10-CM | POA: Diagnosis not present

## 2022-06-08 DIAGNOSIS — J45909 Unspecified asthma, uncomplicated: Secondary | ICD-10-CM | POA: Diagnosis not present

## 2022-06-08 NOTE — Progress Notes (Signed)
Pt verbalizes understanding of 2:45 PM arrival and stopping liquids by 2pm and Not eating food after 0800 AM (per Dr Sampson Goon).

## 2022-06-08 NOTE — Telephone Encounter (Signed)
RN from [facility] is calling needing wound measurements to get wound VAC approved. Please call Tereasa Coop at [phone]

## 2022-06-08 NOTE — Telephone Encounter (Signed)
VM left w/ wound measurements, left my cell # so she can contact directly. Main wound is 30 x 15mm and lateral is 15 x 10mm, will have updated post debridement  measurements tomorrrow

## 2022-06-09 ENCOUNTER — Encounter (HOSPITAL_COMMUNITY): Payer: Self-pay | Admitting: Podiatry

## 2022-06-09 ENCOUNTER — Ambulatory Visit (HOSPITAL_COMMUNITY)
Admission: RE | Admit: 2022-06-09 | Discharge: 2022-06-09 | Disposition: A | Discharge status: Home / Self Care | Payer: BC Managed Care – PPO | Attending: Podiatry | Admitting: Podiatry

## 2022-06-09 ENCOUNTER — Ambulatory Visit (HOSPITAL_COMMUNITY): Payer: BC Managed Care – PPO | Admitting: Certified Registered"

## 2022-06-09 ENCOUNTER — Telehealth: Payer: Self-pay | Admitting: Podiatry

## 2022-06-09 ENCOUNTER — Encounter (HOSPITAL_COMMUNITY)
Admission: RE | Disposition: A | Discharge status: Home / Self Care | Payer: Self-pay | Source: Home / Self Care | Attending: Podiatry

## 2022-06-09 DIAGNOSIS — Z79899 Other long term (current) drug therapy: Secondary | ICD-10-CM | POA: Insufficient documentation

## 2022-06-09 DIAGNOSIS — I10 Essential (primary) hypertension: Secondary | ICD-10-CM | POA: Diagnosis not present

## 2022-06-09 DIAGNOSIS — Y839 Surgical procedure, unspecified as the cause of abnormal reaction of the patient, or of later complication, without mention of misadventure at the time of the procedure: Secondary | ICD-10-CM | POA: Insufficient documentation

## 2022-06-09 DIAGNOSIS — F32A Depression, unspecified: Secondary | ICD-10-CM | POA: Insufficient documentation

## 2022-06-09 DIAGNOSIS — T8131XA Disruption of external operation (surgical) wound, not elsewhere classified, initial encounter: Secondary | ICD-10-CM | POA: Diagnosis not present

## 2022-06-09 DIAGNOSIS — S90851A Superficial foreign body, right foot, initial encounter: Secondary | ICD-10-CM

## 2022-06-09 DIAGNOSIS — F17200 Nicotine dependence, unspecified, uncomplicated: Secondary | ICD-10-CM | POA: Diagnosis not present

## 2022-06-09 DIAGNOSIS — F419 Anxiety disorder, unspecified: Secondary | ICD-10-CM | POA: Insufficient documentation

## 2022-06-09 DIAGNOSIS — T8141XA Infection following a procedure, superficial incisional surgical site, initial encounter: Secondary | ICD-10-CM | POA: Insufficient documentation

## 2022-06-09 DIAGNOSIS — T8130XA Disruption of wound, unspecified, initial encounter: Secondary | ICD-10-CM | POA: Diagnosis not present

## 2022-06-09 DIAGNOSIS — S91301A Unspecified open wound, right foot, initial encounter: Secondary | ICD-10-CM

## 2022-06-09 DIAGNOSIS — J449 Chronic obstructive pulmonary disease, unspecified: Secondary | ICD-10-CM | POA: Diagnosis not present

## 2022-06-09 DIAGNOSIS — S93301A Unspecified subluxation of right foot, initial encounter: Secondary | ICD-10-CM | POA: Diagnosis not present

## 2022-06-09 HISTORY — PX: GRAFT APPLICATION: SHX6696

## 2022-06-09 HISTORY — PX: APPLICATION OF WOUND VAC: SHX5189

## 2022-06-09 HISTORY — PX: WOUND DEBRIDEMENT: SHX247

## 2022-06-09 SURGERY — DEBRIDEMENT, WOUND
Anesthesia: Monitor Anesthesia Care | Site: Foot | Laterality: Right

## 2022-06-09 MED ORDER — MIDAZOLAM HCL 2 MG/2ML IJ SOLN
INTRAMUSCULAR | Status: AC
  Filled 2022-06-09: qty 2

## 2022-06-09 MED ORDER — DEXAMETHASONE SODIUM PHOSPHATE 10 MG/ML IJ SOLN
INTRAMUSCULAR | Status: DC | PRN
  Administered 2022-06-09: 10 mg via INTRAVENOUS

## 2022-06-09 MED ORDER — HYDROMORPHONE HCL 1 MG/ML IJ SOLN: 0.2500 mg | INTRAMUSCULAR | Status: DC | PRN

## 2022-06-09 MED ORDER — HYDROXYZINE HCL 25 MG PO TABS
12.5000 mg | ORAL_TABLET | Freq: Once | ORAL | Status: AC
  Administered 2022-06-09: 12.5 mg via ORAL
  Filled 2022-06-09: qty 1

## 2022-06-09 MED ORDER — OXYCODONE HCL 5 MG PO TABS
ORAL_TABLET | ORAL | Status: AC
  Administered 2022-06-09: 5 mg via ORAL
  Filled 2022-06-09: qty 1

## 2022-06-09 MED ORDER — PROMETHAZINE HCL 25 MG/ML IJ SOLN: 6.2500 mg | INTRAMUSCULAR | Status: DC | PRN

## 2022-06-09 MED ORDER — CHLORHEXIDINE GLUCONATE 0.12 % MT SOLN
15.0000 mL | Freq: Once | OROMUCOSAL | Status: AC
  Administered 2022-06-09: 15 mL via OROMUCOSAL

## 2022-06-09 MED ORDER — BUPIVACAINE HCL (PF) 0.5 % IJ SOLN
INTRAMUSCULAR | Status: AC
  Filled 2022-06-09: qty 30

## 2022-06-09 MED ORDER — ORAL CARE MOUTH RINSE: 15.0000 mL | Freq: Once | OROMUCOSAL | Status: AC

## 2022-06-09 MED ORDER — ONDANSETRON HCL 4 MG/2ML IJ SOLN
INTRAMUSCULAR | Status: DC | PRN
  Administered 2022-06-09: 4 mg via INTRAVENOUS

## 2022-06-09 MED ORDER — DOXYCYCLINE HYCLATE 100 MG PO TABS: 100.0000 mg | ORAL_TABLET | Freq: Two times a day (BID) | ORAL | 0 refills | Status: DC

## 2022-06-09 MED ORDER — OXYCODONE HCL 5 MG PO TABS: 5.0000 mg | ORAL_TABLET | Freq: Once | ORAL | Status: AC | PRN

## 2022-06-09 MED ORDER — PROPOFOL 500 MG/50ML IV EMUL
INTRAVENOUS | Status: DC | PRN
  Administered 2022-06-09: 75 ug/kg/min via INTRAVENOUS

## 2022-06-09 MED ORDER — MEPERIDINE HCL 50 MG/ML IJ SOLN: 6.2500 mg | INTRAMUSCULAR | Status: DC | PRN

## 2022-06-09 MED ORDER — FENTANYL CITRATE (PF) 100 MCG/2ML IJ SOLN
INTRAMUSCULAR | Status: AC
  Filled 2022-06-09: qty 2

## 2022-06-09 MED ORDER — CEFAZOLIN SODIUM-DEXTROSE 2-4 GM/100ML-% IV SOLN
2.0000 g | INTRAVENOUS | Status: AC
  Administered 2022-06-09: 2 g via INTRAVENOUS
  Filled 2022-06-09: qty 100

## 2022-06-09 MED ORDER — 0.9 % SODIUM CHLORIDE (POUR BTL) OPTIME
TOPICAL | Status: DC | PRN
  Administered 2022-06-09: 1000 mL

## 2022-06-09 MED ORDER — VANCOMYCIN HCL 1000 MG IV SOLR
INTRAVENOUS | Status: AC
  Filled 2022-06-09: qty 20

## 2022-06-09 MED ORDER — MIDAZOLAM HCL 5 MG/5ML IJ SOLN
INTRAMUSCULAR | Status: DC | PRN
  Administered 2022-06-09: 2 mg via INTRAVENOUS

## 2022-06-09 MED ORDER — FENTANYL CITRATE (PF) 100 MCG/2ML IJ SOLN
INTRAMUSCULAR | Status: DC | PRN
  Administered 2022-06-09: 100 ug via INTRAVENOUS

## 2022-06-09 MED ORDER — MIDAZOLAM HCL 2 MG/2ML IJ SOLN: 0.5000 mg | Freq: Once | INTRAMUSCULAR | Status: DC | PRN

## 2022-06-09 MED ORDER — OXYCODONE HCL 5 MG/5ML PO SOLN: 5.0000 mg | Freq: Once | ORAL | Status: AC | PRN

## 2022-06-09 MED ORDER — LACTATED RINGERS IV SOLN: INTRAVENOUS | Status: DC

## 2022-06-09 MED ORDER — BUPIVACAINE HCL (PF) 0.5 % IJ SOLN
INTRAMUSCULAR | Status: DC | PRN
  Administered 2022-06-09: 10 mL

## 2022-06-09 MED ORDER — ACETAMINOPHEN 500 MG PO TABS: 1000.0000 mg | ORAL_TABLET | Freq: Once | ORAL | Status: DC

## 2022-06-09 MED ORDER — OXYCODONE-ACETAMINOPHEN 5-325 MG PO TABS: 1.0000 | ORAL_TABLET | ORAL | 0 refills | Status: AC | PRN

## 2022-06-09 SURGICAL SUPPLY — 59 items
APL PRP STRL LF DISP 70% ISPRP (MISCELLANEOUS)
BAG COUNTER SPONGE SURGICOUNT (BAG) ×2 IMPLANT
BAG SPNG CNTER NS LX DISP (BAG) ×1
BLADE SURG 15 STRL LF DISP TIS (BLADE) ×2 IMPLANT
BLADE SURG 15 STRL SS (BLADE) ×1
BNDG CMPR 5X3 KNIT ELC UNQ LF (GAUZE/BANDAGES/DRESSINGS) ×1
BNDG CMPR 5X4 KNIT ELC UNQ LF (GAUZE/BANDAGES/DRESSINGS) ×1
BNDG CMPR 9X4 STRL LF SNTH (GAUZE/BANDAGES/DRESSINGS)
BNDG ELASTIC 3INX 5YD STR LF (GAUZE/BANDAGES/DRESSINGS) ×2 IMPLANT
BNDG ELASTIC 4INX 5YD STR LF (GAUZE/BANDAGES/DRESSINGS) ×2 IMPLANT
BNDG ESMARK 4X9 LF (GAUZE/BANDAGES/DRESSINGS) IMPLANT
BNDG GAUZE DERMACEA FLUFF 4 (GAUZE/BANDAGES/DRESSINGS) ×2 IMPLANT
BNDG GZE DERMACEA 4 6PLY (GAUZE/BANDAGES/DRESSINGS) ×1
CHLORAPREP W/TINT 26 (MISCELLANEOUS) IMPLANT
CNTNR URN SCR LID CUP LEK RST (MISCELLANEOUS) IMPLANT
CONT SPEC 4OZ STRL OR WHT (MISCELLANEOUS)
COVER BACK TABLE 60X90IN (DRAPES) ×2 IMPLANT
CUFF TOURN SGL QUICK 18X4 (TOURNIQUET CUFF) IMPLANT
CUFF TOURN SGL QUICK 24 (TOURNIQUET CUFF)
CUFF TRNQT CYL 24X4X16.5-23 (TOURNIQUET CUFF) IMPLANT
DRAPE 3/4 80X56 (DRAPES) ×2 IMPLANT
DRAPE EXTREMITY T 121X128X90 (DISPOSABLE) ×2 IMPLANT
DRAPE SHEET LG 3/4 BI-LAMINATE (DRAPES) ×2 IMPLANT
DRAPE U-SHAPE 47X51 STRL (DRAPES) ×2 IMPLANT
DRESSING MATRIX 2X2 BILAYER (Tissue) IMPLANT
DRSG MATRIX 2X2 BILAYER (Tissue) ×1 IMPLANT
ELECT REM PT RETURN 15FT ADLT (MISCELLANEOUS) ×2 IMPLANT
GAUZE SPONGE 4X4 12PLY STRL (GAUZE/BANDAGES/DRESSINGS) ×2 IMPLANT
GAUZE XEROFORM 1X8 LF (GAUZE/BANDAGES/DRESSINGS) IMPLANT
GLOVE BIO SURGEON STRL SZ7.5 (GLOVE) ×2 IMPLANT
GLOVE BIOGEL PI IND STRL 8 (GLOVE) ×2 IMPLANT
GOWN STRL REUS W/ TWL XL LVL3 (GOWN DISPOSABLE) ×2 IMPLANT
GOWN STRL REUS W/TWL XL LVL3 (GOWN DISPOSABLE) ×1
KIT BASIN OR (CUSTOM PROCEDURE TRAY) ×2 IMPLANT
KIT TURNOVER KIT A (KITS) IMPLANT
MANIFOLD NEPTUNE II (INSTRUMENTS) ×2 IMPLANT
NDL HYPO 25X1 1.5 SAFETY (NEEDLE) ×2 IMPLANT
NEEDLE HYPO 25X1 1.5 SAFETY (NEEDLE) ×1 IMPLANT
NS IRRIG 1000ML POUR BTL (IV SOLUTION) IMPLANT
PADDING CAST ABS COTTON 4X4 ST (CAST SUPPLIES) ×2 IMPLANT
SET IRRIG Y TYPE TUR BLADDER L (SET/KITS/TRAYS/PACK) IMPLANT
SPONGE T-LAP 4X18 ~~LOC~~+RFID (SPONGE) ×2 IMPLANT
STAPLER VISISTAT 35W (STAPLE) IMPLANT
STOCKINETTE 6  STRL (DRAPES) ×1
STOCKINETTE 6 STRL (DRAPES) ×2 IMPLANT
SUCTION FRAZIER HANDLE 10FR (MISCELLANEOUS) ×1
SUCTION TUBE FRAZIER 10FR DISP (MISCELLANEOUS) IMPLANT
SUT ETHILON 3 0 PS 1 (SUTURE) IMPLANT
SUT ETHILON 4 0 PS 2 18 (SUTURE) IMPLANT
SUT MNCRL AB 3-0 PS2 18 (SUTURE) IMPLANT
SUT MNCRL AB 4-0 PS2 18 (SUTURE) IMPLANT
SUT VIC AB 2-0 SH 27 (SUTURE)
SUT VIC AB 2-0 SH 27XBRD (SUTURE) IMPLANT
SYR BULB EAR ULCER 3OZ GRN STR (SYRINGE) ×2 IMPLANT
SYR CONTROL 10ML LL (SYRINGE) ×2 IMPLANT
TUBE IRRIGATION SET MISONIX (TUBING) IMPLANT
TUBING CONNECTING 10 (TUBING) IMPLANT
UNDERPAD 30X36 HEAVY ABSORB (UNDERPADS AND DIAPERS) ×2 IMPLANT
YANKAUER SUCT BULB TIP NO VENT (SUCTIONS) ×2 IMPLANT

## 2022-06-09 NOTE — Transfer of Care (Signed)
Immediate Anesthesia Transfer of Care Note  Patient: Suzanne Jackson  Procedure(s) Performed: DEBRIDEMENT WOUND (Right: Foot) GRAFT APPLICATION (Right: Foot) APPLICATION OF WOUND VAC (Right: Foot)  Patient Location: PACU  Anesthesia Type:MAC  Level of Consciousness: awake, alert , and oriented  Airway & Oxygen Therapy: Patient Spontanous Breathing and Patient connected to face mask oxygen  Post-op Assessment: Report given to RN and Post -op Vital signs reviewed and stable  Post vital signs: Reviewed and stable  Last Vitals:  Vitals Value Taken Time  BP    Temp    Pulse 82 06/09/22 1711  Resp 19 06/09/22 1711  SpO2 100 % 06/09/22 1711  Vitals shown include unvalidated device data.  Last Pain:  Vitals:   06/09/22 1519  TempSrc: Oral  PainSc: 5          Complications: No notable events documented.

## 2022-06-09 NOTE — Telephone Encounter (Signed)
Tereasa Coop, nurse from [facility], called and said she had gotten length/width measurements from Dr. Lilian Kapur for the patient's two wounds, but never got the depth measurement.  Reviewed patient's last visit note and obtained depth from there.  She does not need anything further at this time.  -Nare Gaspari

## 2022-06-09 NOTE — H&P (Signed)
History and Physical Interval Note:  06/09/2022 4:09 PM  Suzanne Jackson  has presented today for surgery, with the diagnosis of left foot wound.  The various methods of treatment have been discussed with the patient and family. After consideration of risks, benefits and other options for treatment, the patient has consented to   Procedure(s): DEBRIDEMENT WOUND (Right) GRAFT APPLICATION (Right) APPLICATION OF WOUND VAC (Right) as a surgical intervention.  The patient's history has been reviewed, patient examined, no change in status, stable for surgery.  I have reviewed the patient's chart and labs.  Questions were answered to the patient's satisfaction.     Edwin Cap

## 2022-06-09 NOTE — Anesthesia Preprocedure Evaluation (Addendum)
Anesthesia Evaluation  Patient identified by MRN, date of birth, ID band Patient awake    Reviewed: Allergy & Precautions, NPO status , Patient's Chart, lab work & pertinent test results  History of Anesthesia Complications Negative for: history of anesthetic complications  Airway Mallampati: II  TM Distance: >3 FB Neck ROM: Full    Dental  (+) Poor Dentition, Missing, Dental Advisory Given   Pulmonary COPD,  COPD inhaler, Current SmokerPatient did not abstain from smoking.   breath sounds clear to auscultation       Cardiovascular hypertension, Pt. on medications (-) angina  Rhythm:Regular Rate:Normal     Neuro/Psych   Anxiety Depression       GI/Hepatic negative GI ROS,,,(+)     substance abuse  alcohol use and marijuana useH/o alcoholic pancreatitis H/o alcohol withdrawal   Endo/Other  negative endocrine ROS    Renal/GU H/o stones     Musculoskeletal   Abdominal   Peds  Hematology   Anesthesia Other Findings   Reproductive/Obstetrics                             Anesthesia Physical Anesthesia Plan  ASA: 3  Anesthesia Plan: MAC   Post-op Pain Management: Tylenol PO (pre-op)*   Induction:   PONV Risk Score and Plan: 1 and Ondansetron  Airway Management Planned: Natural Airway and Simple Face Mask  Additional Equipment: None  Intra-op Plan:   Post-operative Plan:   Informed Consent: I have reviewed the patients History and Physical, chart, labs and discussed the procedure including the risks, benefits and alternatives for the proposed anesthesia with the patient or authorized representative who has indicated his/her understanding and acceptance.     Dental advisory given  Plan Discussed with: CRNA and Surgeon  Anesthesia Plan Comments: (MAC with block by podiatrist)        Anesthesia Quick Evaluation

## 2022-06-09 NOTE — Discharge Instructions (Addendum)
Post-Surgery Instructions  1. If you are recuperating from surgery anywhere other than home, please be sure to leave [place] a number where you can be reached. 2. Go directly home and rest. 3. The keep operated foot (or feet) elevated six inches above the hip when sitting or lying down. 4. Support the elevated foot and leg with pillows under the calf. DO NOT PLACE PILLOWS UNDER THE KNEE. 5. DO NOT REMOVE or get your bandages wet. This will increase your chances of getting an infection. 6. Wear your surgical boot at all times when you are up. Can remove when not up and moving. Use the knee scooter to get around 7. A limited amount of pain and swelling may occur. The skin may take on a bruised appearance. This is no cause for alarm. 8. For slight pain and swelling, apply an ice pack directly over the bandage or behind the knee for 15 minutes every hour. Continue icing until seen in the office. DO NOT apply any form of heat to the area. 9. Have prescription(s) filled immediately and take as directed. 10. Drink lots of liquids, water, and juice. 11. CALL THE OFFICE IMMEDIATELY IF: a. Bleeding continues b. Pain increases and/or does not respond to medication c. Bandage or cast appears too tight d. Any liquids (water, coffee, etc.) have spilled on your bandages. e. Tripping, falling, or stubbing the surgical foot f. If your temperature rises above 101 g. If you have ANY questions at all 12. Please use the crutches, knee scooter, or walker you have prescribed, rented, or purchased. If you are non-weight bearing DO NOT put weight on the operated foot for _________ days. If you are weight-bearing, follow your physician's instructions. You are expected to be:   ? non-weight bearing 13. Special Instructions:  _____________________________________________________________ _________________________________________________________________________________ _________________________________________________________________________________  14. Your next appointment is: 06/15/22  If you need to reach the nurse for any reason, please call: [place]: [phone] [place]: [phone] [place]: [phone]

## 2022-06-09 NOTE — Anesthesia Postprocedure Evaluation (Signed)
Anesthesia Post Note  Patient: TASHEIKA LOFT  Procedure(s) Performed: DEBRIDEMENT WOUND (Right: Foot) GRAFT APPLICATION (Right: Foot) APPLICATION OF WOUND VAC (Right: Foot)     Patient location during evaluation: PACU Anesthesia Type: MAC Level of consciousness: awake and alert, patient cooperative and oriented Pain management: pain level controlled Vital Signs Assessment: post-procedure vital signs reviewed and stable Respiratory status: spontaneous breathing, nonlabored ventilation and respiratory function stable Cardiovascular status: stable and blood pressure returned to baseline Postop Assessment: no apparent nausea or vomiting Anesthetic complications: no   No notable events documented.  Last Vitals:  Vitals:   06/09/22 1730 06/09/22 1745  BP: (!) 148/80 (!) 150/87  Pulse: 74 77  Resp: 19 (!) 21  Temp:    SpO2: 90% 90%    Last Pain:  Vitals:   06/09/22 1715  TempSrc:   PainSc: 0-No pain                 Deunte Bledsoe,E. Zayyan Mullen

## 2022-06-09 NOTE — Progress Notes (Signed)
Patient asked about home health Rn for wound vac writer called Dr.McDonald reported left contact numbers for supplies and services related to wound vac with Husband. The Wound Vac Rn would be set up for Tuesday at home thru his office. Patient updated no further changes noted.

## 2022-06-09 NOTE — Brief Op Note (Signed)
06/09/2022  5:08 PM  PATIENT:  Octaviano Batty  52 y.o. female  PRE-OPERATIVE DIAGNOSIS:  Dehiscence of operative wound  POST-OPERATIVE DIAGNOSIS:  Dehiscence of operative wound  PROCEDURE:  Procedure(s): DEBRIDEMENT WOUND (Right) GRAFT APPLICATION (Right) APPLICATION OF WOUND VAC (Right)  SURGEON:  Surgeon(s) and Role:    * Mclain Freer, Rachelle Hora, DPM - Primary  PHYSICIAN ASSISTANT:   ASSISTANTS: none   ANESTHESIA:   local and MAC  EBL:  10 mL   BLOOD ADMINISTERED: none  DRAINS: VAC applied   LOCAL MEDICATIONS USED:  MARCAINE    and Amount: 10 ml  SPECIMEN:  deep wound culture  DISPOSITION OF SPECIMEN:  microbiology  COUNTS:  YES  TOURNIQUET:  * Missing tourniquet times found for documented tourniquets in log: [id number] *  DICTATION: .Note written in EPIC  PLAN OF CARE: Discharge to home after PACU  PATIENT DISPOSITION:  PACU - hemodynamically stable.   Delay start of Pharmacological VTE agent (>24hrs) due to surgical blood loss or risk of bleeding: no

## 2022-06-10 ENCOUNTER — Other Ambulatory Visit: Payer: Self-pay | Admitting: Podiatry

## 2022-06-10 DIAGNOSIS — S93301A Unspecified subluxation of right foot, initial encounter: Secondary | ICD-10-CM | POA: Diagnosis not present

## 2022-06-10 DIAGNOSIS — S91301A Unspecified open wound, right foot, initial encounter: Secondary | ICD-10-CM

## 2022-06-10 DIAGNOSIS — T84629A Infection and inflammatory reaction due to internal fixation device of unspecified bone of leg, initial encounter: Secondary | ICD-10-CM

## 2022-06-10 DIAGNOSIS — T8131XA Disruption of external operation (surgical) wound, not elsewhere classified, initial encounter: Secondary | ICD-10-CM

## 2022-06-10 NOTE — Op Note (Signed)
Patient Name: Suzanne Jackson DOB: Jan 12, 1971  MRN: [id number]   Date of Service: 06/09/2022  Surgeon: Dr. Sharl Ma, DPM Assistants: None Pre-operative Diagnosis:  Dehiscence of operative wound  Post-operative Diagnosis:  Dehiscence of operative wound Deep surgical site infection with exposed orthopedic hardware Procedures: Procedures:   * DEBRIDEMENT WOUND   * GRAFT APPLICATION   * APPLICATION OF WOUND VAC Pathology/Specimens: ID Type Source Tests Collected by Time Destination  A : RIGHT FOOT Wound Wound FUNGUS CULTURE WITH STAIN, AEROBIC/ANAEROBIC CULTURE W Romie Minus STAIN (SURGICAL/DEEP WOUND) (Canceled) Edwin Cap, DPM 06/09/2022 1640    Anesthesia: MAC Hemostasis: * Missing tourniquet times found for documented tourniquets in log: [id number] * Estimated Blood Loss: 10 mL Materials:  Implant Name Type Inv. Item Serial No. Manufacturer Lot No. LRB No. Used Action  [employer]TE[employer] BILAYER MATRIX WOUND DRESSING     [id number] Right 1 Implanted   Medications: 10cc 0.5% marcaine plain Complications: No complication noted Findings: Gross deep wound dehiscence to the level of bone and implant, no overt purulence, serous drainage adjacent, no loosening of implant, bone appeared healthy and viable  Indications for Procedure:  This is a 52 y.o. female with a history of significant tobacco use and had undergone a previous bunionectomy.  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  She returned to the OR for revision April 29 of this year and presented to the office for her second and third postoperative visits with gross wound dehiscence and surgical site infection.  On my evaluation this week I recommended debridement of the necrotic tissue and application of a skin substitute to facilitate wound healing. All risks, benefits and potential complications discussed prior to the procedure. All questions addressed. Informed  consent signed and reviewed.      Procedure in Detail: Patient was identified in pre-operative holding area. Formal consent was signed and the right lower extremity was marked. Patient was brought back to the operating room. Anesthesia was induced. The extremity was prepped and draped in the usual sterile fashion. Timeout was taken to confirm patient name, laterality, and procedure prior to incision.   Attention was then directed to the right foot which exhibited a gross wound dehiscence of both forefoot incisions.  I began by evaluating the tissue integrity of the fibrous tissue that was still present.  Distally the tissue was very thin and friable and manipulating this led to emanation of an amount of serous fluid.  A culture of this fluid was taken.  This directly communicated to the bone and underlying implant.  The thin friable tissue was nonviable and I began by debriding the wound bed of all nonviable tissue, this nonviable tissue was the only tissue covering the plate over the first metatarsal.  Once I was satisfied with complete debridement of all nonviable tissues it was thoroughly irrigated with normal sterile saline.  Hemostasis was achieved.  There were granular tissue in the margins beginning to form along the perimeter.  Debridement is also carried out followed by irrigation of the smaller lateral wound, this did not penetrate into the deep tissues and had significant granulation tissue present in the wound bed.  Postdebridement, the measurements of the wounds were 4.3 cm x 1.5 cm x 0.7 cm for the primary Lapidus incision and 1.5 x 1.1 x 0.3 cm for the secondary incision.  An Integra bilayer wound matrix was then selected and cut to size and shape.  It  was fenestrated with a knife and secured to the skin margins with staples.  A negative pressure wound therapy VAC was then applied and set to continuous pressure at 125 mmHg to facilitate granulation tissue and integration of the graft into the  wound bed.  The foot was then dressed with an Ace wrap under light compression. Patient tolerated the procedure well.   Disposition: Following a period of post-operative monitoring, patient will be transferred to home.  I discussed the findings of the surgery with her husband, we communicated that it is absolutely critical that she cease smoking and use nicotine supplementation as this is likely the primary reason of her wound dehiscence and infection.  She does have noninvasive vascular testing ordered and we will follow-up on this.  I am referring her to infectious disease for IV antibiotic therapy due to the exposed hardware, she does not have sufficient bony healing to facilitate removal of the hardware at this time.  I also discussed with him she may need to return to the operating room for further tissue substitute application and possible autologous skin grafting.  I also discussed with him that she is at risk of limb loss if this continues to worsen and we are not able to achieve a satisfactory outcome.

## 2022-06-11 DIAGNOSIS — T8131XA Disruption of external operation (surgical) wound, not elsewhere classified, initial encounter: Secondary | ICD-10-CM | POA: Diagnosis not present

## 2022-06-11 DIAGNOSIS — S93301A Unspecified subluxation of right foot, initial encounter: Secondary | ICD-10-CM | POA: Diagnosis not present

## 2022-06-11 LAB — AEROBIC CULTURE W GRAM STAIN (SUPERFICIAL SPECIMEN)

## 2022-06-12 DIAGNOSIS — T8131XA Disruption of external operation (surgical) wound, not elsewhere classified, initial encounter: Secondary | ICD-10-CM | POA: Diagnosis not present

## 2022-06-12 DIAGNOSIS — S93301A Unspecified subluxation of right foot, initial encounter: Secondary | ICD-10-CM | POA: Diagnosis not present

## 2022-06-12 LAB — AEROBIC CULTURE W GRAM STAIN (SUPERFICIAL SPECIMEN)
Culture: NO GROWTH
Gram Stain: NONE SEEN

## 2022-06-13 ENCOUNTER — Encounter (HOSPITAL_COMMUNITY): Payer: Self-pay | Admitting: Podiatry

## 2022-06-13 ENCOUNTER — Other Ambulatory Visit: Payer: Self-pay

## 2022-06-13 ENCOUNTER — Ambulatory Visit (INDEPENDENT_AMBULATORY_CARE_PROVIDER_SITE_OTHER): Payer: BC Managed Care – PPO | Admitting: Podiatry

## 2022-06-13 ENCOUNTER — Telehealth: Payer: Self-pay | Admitting: Podiatry

## 2022-06-13 DIAGNOSIS — L97516 Non-pressure chronic ulcer of other part of right foot with bone involvement without evidence of necrosis: Secondary | ICD-10-CM

## 2022-06-13 DIAGNOSIS — T8131XA Disruption of external operation (surgical) wound, not elsewhere classified, initial encounter: Secondary | ICD-10-CM

## 2022-06-13 DIAGNOSIS — S93301A Unspecified subluxation of right foot, initial encounter: Secondary | ICD-10-CM | POA: Diagnosis not present

## 2022-06-13 NOTE — Progress Notes (Signed)
  Subjective:  Patient ID: Suzanne Jackson, female    DOB: 1970/12/20,  MRN: [id number]  Chief Complaint  Patient presents with   Post-op Problem    POV #1 DOS 06/09/2022 RT FOOT WOUND DEBRIDEMENT & GRAFT APPLICATION - wound vac malfunction     52 y.o. female returns for post-op check.  She returns earlier than planned due to her wound VAC malfunctioning says that the suction cup had ripped off of the sponge, I advised her to remove the [facility] and apply it clean dressing which she did.  She says that the OR staff did not send her home with the [facility] supplies that have been delivered and so she does not have any more dressing supplies there.  Review of Systems: Negative except as noted in the HPI. Denies N/V/F/Ch.   Objective:  There were no vitals filed for this visit. There is no height or weight on file to calculate BMI. Constitutional Well developed. Well nourished.  Vascular Foot warm and well perfused. Capillary refill normal to all digits.  Calf is soft and supple, no posterior calf or knee pain, negative Homans' sign  Neurologic Normal speech. Oriented to person, place, and time. Epicritic sensation to light touch grossly present bilaterally.  Dermatologic There is no cellulitis or erythema.  No active drainage.  Graft intact and in position with staples in place, no signs of infection  Orthopedic: Tenderness to palpation noted about the surgical site.    Assessment:   1. Dehiscence of operative wound, initial encounter   2. Ulcer of right foot with bone involvement without evidence of necrosis [facility])    Plan:  Patient was evaluated and treated and all questions answered.  S/p foot surgery right -Wound VAC was reapplied today in the office.  I gave her an additional sponge and drapes to take with her in case there are any further leaks or complications.  I have spoken with the OR manager at [facility] and they are going to look for the supplies to get to the  patient -Aerobic culture shows no growth.  The anaerobic culture currently does not show up in the chart, I spoke with the OR manager as well as the microbiology lab and they state that the anaerobic culture was not sent.  Safety zone has been filed for investigation.  Hopefully does not hamper therapy, encouraging there is no growth and no clinical signs of active infection -ID referral has been placed and is scheduled for next week -Continue nonweightbearing, I recommended she stay in the boot essentially at all times so that the sponge and VAC does not get disrupted again -Referral for home health care for nursing changes for wound VAC has been sent as well -We will keep her current appointment, if home therapy is able to come change the wound VAC on Friday we will follow-up next week or the week after.  Return in about 2 days (around 06/15/2022) for post op (no x-rays), wound care.

## 2022-06-13 NOTE — Telephone Encounter (Signed)
Patient called answering service overnight, the suction pad for the wound VAC sponge has come off she says the sponge and drapes remain but the suction pad tore off somehow.  I advised her to remove the remaining dressing gently with saline and that she should come in today for a dressing change and reevaluation, she says she does not have any supplies at home that they were not given to her at the OR which they were supposed to be sent home with her otherwise we could reapply a new suction pad.  Also has a nurse coming out today

## 2022-06-14 DIAGNOSIS — S93301A Unspecified subluxation of right foot, initial encounter: Secondary | ICD-10-CM | POA: Diagnosis not present

## 2022-06-14 DIAGNOSIS — T8131XA Disruption of external operation (surgical) wound, not elsewhere classified, initial encounter: Secondary | ICD-10-CM | POA: Diagnosis not present

## 2022-06-15 ENCOUNTER — Ambulatory Visit (INDEPENDENT_AMBULATORY_CARE_PROVIDER_SITE_OTHER): Payer: BC Managed Care – PPO | Admitting: Podiatry

## 2022-06-15 DIAGNOSIS — S93301A Unspecified subluxation of right foot, initial encounter: Secondary | ICD-10-CM | POA: Diagnosis not present

## 2022-06-15 DIAGNOSIS — T8131XA Disruption of external operation (surgical) wound, not elsewhere classified, initial encounter: Secondary | ICD-10-CM | POA: Diagnosis not present

## 2022-06-15 DIAGNOSIS — T84629A Infection and inflammatory reaction due to internal fixation device of unspecified bone of leg, initial encounter: Secondary | ICD-10-CM

## 2022-06-15 DIAGNOSIS — L97516 Non-pressure chronic ulcer of other part of right foot with bone involvement without evidence of necrosis: Secondary | ICD-10-CM

## 2022-06-15 NOTE — Progress Notes (Signed)
  Subjective:  Patient ID: Suzanne Jackson, female    DOB: [birth year],  MRN: [id number]  Chief Complaint  Patient presents with   Routine Post Op    POV #2 DOS 06/09/2022 RT FOOT WOUND DEBRIDEMENT & GRAFT APPLICATION     52 y.o. female returns for post-op check.  She returns earlier than planned due to her wound VAC malfunctioning says that the suctio returns for follow-up.  No issues with the VAC this time.  Home nursing is scheduled to return next week to begin Monday Wednesday Friday VAC changes  Review of Systems: Negative except as noted in the HPI. Denies N/V/F/Ch.   Objective:  There were no vitals filed for this visit. There is no height or weight on file to calculate BMI. Constitutional Well developed. Well nourished.  Vascular Foot warm and well perfused. Capillary refill normal to all digits.  Calf is soft and supple, no posterior calf or knee pain, negative Homans' sign  Neurologic Normal speech. Oriented to person, place, and time. Epicritic sensation to light touch grossly present bilaterally.  Dermatologic There is no cellulitis or erythema.  Very mild serous drainage.  Graft intact and in position with staples in place, no signs of infection.  Good granulation tissue forming on lateral wound.  Good granulation tissue forming on the periphery.  Minimal coverage over plate so far  Orthopedic: Tenderness to palpation noted about the surgical site.   Aerobic culture 06/09/2022 final no growth Anaerobic and fungal cultures from this date were lost from operating room to microbiology  Assessment:   1. Ulcer of right foot with bone involvement without evidence of necrosis (HCC)   2. Dehiscence of operative wound, initial encounter   3. Infection associated with internal fixation device of bone of lower extremity, initial encounter [facility])    Plan:  Patient was evaluated and treated and all questions answered.  S/p foot surgery right -Wound VAC was reapplied today in the  office.  Home nursing is arranged to begin changes next week -Aerobic culture shows no growth and is finalized.  Yesterday I discussed her anaerobic culture which was lost from the operating room to the microbiology lab to the operating room manager.  I had confirmed the name and types of specimen I wanted send at the end of the surgery with the circulator.  Circulator confirmed that these were sent uneventfully.  Microbiology did not receive them.  I have opened a safety zone portal in order to investigate and for process improvement.  Unfortunately I was also notified yesterday that they did not receive the fungal culture either which was the same swab as anaerobe.  She had minimal serous drainage today.  I did culture this as well and this was sent for aerobic anaerobic culture -ID referral has been placed and is scheduled for next week -Continue nonweightbearing, I recommended she stay in the boot essentially at all times so that the sponge and VAC does not get disrupted again -Continue noninvasive bone growth stimulation -At next visit we will take new x-rays and reassess the bone healing -Advised to continue smoking cessation which so far she has maintained in the last week  Return in about 2 weeks (around 06/29/2022) for wound care, post op (new x-rays).

## 2022-06-16 DIAGNOSIS — S93301A Unspecified subluxation of right foot, initial encounter: Secondary | ICD-10-CM | POA: Diagnosis not present

## 2022-06-16 DIAGNOSIS — T8131XA Disruption of external operation (surgical) wound, not elsewhere classified, initial encounter: Secondary | ICD-10-CM | POA: Diagnosis not present

## 2022-06-16 LAB — WOUND CULTURE: SPECIMEN QUALITY:: ADEQUATE

## 2022-06-17 DIAGNOSIS — S93301A Unspecified subluxation of right foot, initial encounter: Secondary | ICD-10-CM | POA: Diagnosis not present

## 2022-06-17 DIAGNOSIS — T8131XA Disruption of external operation (surgical) wound, not elsewhere classified, initial encounter: Secondary | ICD-10-CM | POA: Diagnosis not present

## 2022-06-18 DIAGNOSIS — T8131XA Disruption of external operation (surgical) wound, not elsewhere classified, initial encounter: Secondary | ICD-10-CM | POA: Diagnosis not present

## 2022-06-18 DIAGNOSIS — S93301A Unspecified subluxation of right foot, initial encounter: Secondary | ICD-10-CM | POA: Diagnosis not present

## 2022-06-18 LAB — WOUND CULTURE: RESULT:: NO GROWTH

## 2022-06-19 ENCOUNTER — Telehealth: Payer: Self-pay | Admitting: Podiatry

## 2022-06-19 DIAGNOSIS — I10 Essential (primary) hypertension: Secondary | ICD-10-CM | POA: Diagnosis not present

## 2022-06-19 DIAGNOSIS — T8131XA Disruption of external operation (surgical) wound, not elsewhere classified, initial encounter: Secondary | ICD-10-CM | POA: Diagnosis not present

## 2022-06-19 DIAGNOSIS — F10230 Alcohol dependence with withdrawal, uncomplicated: Secondary | ICD-10-CM | POA: Diagnosis not present

## 2022-06-19 DIAGNOSIS — Z7901 Long term (current) use of anticoagulants: Secondary | ICD-10-CM | POA: Diagnosis not present

## 2022-06-19 DIAGNOSIS — S93301A Unspecified subluxation of right foot, initial encounter: Secondary | ICD-10-CM | POA: Diagnosis not present

## 2022-06-19 DIAGNOSIS — F32A Depression, unspecified: Secondary | ICD-10-CM | POA: Diagnosis not present

## 2022-06-19 DIAGNOSIS — Z7951 Long term (current) use of inhaled steroids: Secondary | ICD-10-CM | POA: Diagnosis not present

## 2022-06-19 LAB — WOUND CULTURE: MICRO NUMBER:: 15020375

## 2022-06-19 NOTE — Telephone Encounter (Signed)
Home Health called again with more questions:  1)  They need you to give them the "frequency" that the home health nurse needs to be there.  2)  The patient only has 2 Xarelto pills left.  Do you want her to continue with it?  If so, she'll need more sent in.  3)  Do you want them to accept orders from ANY provider at [facility] (I think that one pertains to me answering their earlier question about Xeroform as temporary replacement for Adaptic until they get a deliver)?  Please call her at [phone] and let her know.  Thanks.

## 2022-06-19 NOTE — Telephone Encounter (Signed)
Nurse Lillia Abed called, noting she was driving the the patient's home right now, but she does not have any Adaptic at the moment.  She has ordered some, but was wondering if it was okay to replace with Xeroform gauze until the Adaptic comes in.    She was given the verbal approval for this over the phone,  and will switch it back to Adaptic once it's delivered.

## 2022-06-20 ENCOUNTER — Telehealth: Payer: Self-pay | Admitting: Podiatry

## 2022-06-20 DIAGNOSIS — S93301A Unspecified subluxation of right foot, initial encounter: Secondary | ICD-10-CM | POA: Diagnosis not present

## 2022-06-20 DIAGNOSIS — T8131XA Disruption of external operation (surgical) wound, not elsewhere classified, initial encounter: Secondary | ICD-10-CM | POA: Diagnosis not present

## 2022-06-20 NOTE — Telephone Encounter (Signed)
Lillia Abed from [facility] called again today asking for a response regarding yesterday's call.    She then asked if it's okay to wrap with Kerlex and Ace wrap, noting that bone is exposed.   Informed her that I could not provide her with verbal orders since she questioned yesterday if Dr. Lilian Kapur gave the okay for other doctors in the group to give verbal orders on his behalf.  Informed her that I would forward the message to him at her request.

## 2022-06-20 NOTE — Telephone Encounter (Signed)
I spoke with her and clarified that Xeroform is fine she is going to order Adaptic, they will continue Monday Wednesday Friday changes and we will plan for about a 6-week course for now and then reevaluate.  Will not need further Xarelto.  Discussed also that I will be out of the office for a few weeks and any provider is able to provide verbal orders in managing her care and recommendations

## 2022-06-21 ENCOUNTER — Ambulatory Visit: Payer: BC Managed Care – PPO | Admitting: Infectious Diseases

## 2022-06-21 DIAGNOSIS — Z7901 Long term (current) use of anticoagulants: Secondary | ICD-10-CM | POA: Diagnosis not present

## 2022-06-21 DIAGNOSIS — F10230 Alcohol dependence with withdrawal, uncomplicated: Secondary | ICD-10-CM | POA: Diagnosis not present

## 2022-06-21 DIAGNOSIS — F32A Depression, unspecified: Secondary | ICD-10-CM | POA: Diagnosis not present

## 2022-06-21 DIAGNOSIS — I10 Essential (primary) hypertension: Secondary | ICD-10-CM | POA: Diagnosis not present

## 2022-06-21 DIAGNOSIS — M869 Osteomyelitis, unspecified: Secondary | ICD-10-CM | POA: Insufficient documentation

## 2022-06-21 DIAGNOSIS — Z7951 Long term (current) use of inhaled steroids: Secondary | ICD-10-CM | POA: Diagnosis not present

## 2022-06-21 DIAGNOSIS — T8131XA Disruption of external operation (surgical) wound, not elsewhere classified, initial encounter: Secondary | ICD-10-CM | POA: Diagnosis not present

## 2022-06-21 DIAGNOSIS — S93301A Unspecified subluxation of right foot, initial encounter: Secondary | ICD-10-CM | POA: Diagnosis not present

## 2022-06-21 DIAGNOSIS — T84498A Other mechanical complication of other internal orthopedic devices, implants and grafts, initial encounter: Secondary | ICD-10-CM | POA: Insufficient documentation

## 2022-06-21 DIAGNOSIS — F1090 Alcohol use, unspecified, uncomplicated: Secondary | ICD-10-CM | POA: Insufficient documentation

## 2022-06-22 ENCOUNTER — Encounter (HOSPITAL_BASED_OUTPATIENT_CLINIC_OR_DEPARTMENT_OTHER): Payer: Self-pay | Admitting: Emergency Medicine

## 2022-06-22 ENCOUNTER — Telehealth: Payer: Self-pay | Admitting: Podiatry

## 2022-06-22 ENCOUNTER — Emergency Department (HOSPITAL_BASED_OUTPATIENT_CLINIC_OR_DEPARTMENT_OTHER)
Admission: EM | Admit: 2022-06-22 | Discharge: 2022-06-22 | Disposition: A | Discharge status: Home / Self Care | Payer: BC Managed Care – PPO | Attending: Emergency Medicine | Admitting: Emergency Medicine

## 2022-06-22 ENCOUNTER — Ambulatory Visit: Payer: BC Managed Care – PPO | Admitting: Internal Medicine

## 2022-06-22 ENCOUNTER — Other Ambulatory Visit: Payer: Self-pay

## 2022-06-22 DIAGNOSIS — F17210 Nicotine dependence, cigarettes, uncomplicated: Secondary | ICD-10-CM | POA: Diagnosis not present

## 2022-06-22 DIAGNOSIS — J45909 Unspecified asthma, uncomplicated: Secondary | ICD-10-CM | POA: Insufficient documentation

## 2022-06-22 DIAGNOSIS — I10 Essential (primary) hypertension: Secondary | ICD-10-CM | POA: Insufficient documentation

## 2022-06-22 DIAGNOSIS — T8131XA Disruption of external operation (surgical) wound, not elsewhere classified, initial encounter: Secondary | ICD-10-CM | POA: Diagnosis not present

## 2022-06-22 DIAGNOSIS — T7840XA Allergy, unspecified, initial encounter: Secondary | ICD-10-CM

## 2022-06-22 DIAGNOSIS — S93301A Unspecified subluxation of right foot, initial encounter: Secondary | ICD-10-CM | POA: Diagnosis not present

## 2022-06-22 DIAGNOSIS — L500 Allergic urticaria: Secondary | ICD-10-CM | POA: Diagnosis not present

## 2022-06-22 DIAGNOSIS — R21 Rash and other nonspecific skin eruption: Secondary | ICD-10-CM | POA: Diagnosis not present

## 2022-06-22 MED ORDER — DIPHENHYDRAMINE HCL 25 MG PO CAPS
50.0000 mg | ORAL_CAPSULE | Freq: Once | ORAL | Status: AC
  Administered 2022-06-22: 50 mg via ORAL
  Filled 2022-06-22: qty 2

## 2022-06-22 MED ORDER — DEXAMETHASONE SODIUM PHOSPHATE 10 MG/ML IJ SOLN
10.0000 mg | Freq: Once | INTRAMUSCULAR | Status: AC
  Administered 2022-06-22: 10 mg via INTRAMUSCULAR
  Filled 2022-06-22: qty 1

## 2022-06-22 MED ORDER — FAMOTIDINE 20 MG PO TABS
20.0000 mg | ORAL_TABLET | Freq: Once | ORAL | Status: AC
  Administered 2022-06-22: 20 mg via ORAL
  Filled 2022-06-22: qty 1

## 2022-06-22 MED ORDER — DOXYCYCLINE HYCLATE 100 MG PO TABS: 100.0000 mg | ORAL_TABLET | Freq: Two times a day (BID) | ORAL | 0 refills | Status: DC

## 2022-06-22 NOTE — Telephone Encounter (Signed)
Pt called and wanted to let you know she had to go to the emergency room last night due to extreme itching and whelps all over her body.  Also she is out of the doxycycline and xarelto. She was not sure if you wanted her to continue the xarelto or not.  It looks like Dr Carlota Raspberry did call in the doxycyline in for her this morning.  Her home health nurse said she should still be on antibiotics and she wanted your opinion on that?

## 2022-06-22 NOTE — ED Provider Notes (Signed)
[facility]ERGENC[facility] Emergency Department Provider Note MRN:  [id number]  Arrival date & time: 06/22/22     Chief Complaint   Urticaria   History of Present Illness   Suzanne Jackson is a 52 y.o. year-old female with no pertinent past medical history presenting to the ED with chief complaint of urticaria.  Total body hives and itchy rash for the past 1 to 2 days.  Denies any new medications or soaps or detergents or foods.  No trouble breathing, no nausea vomiting or diarrhea, no throat swelling or facial swelling.  Review of Systems  A thorough review of systems was obtained and all systems are negative except as noted in the HPI and PMH.   Patient's Health History    Past Medical History:  Diagnosis Date   Alcohol dependence with uncomplicated withdrawal (HCC) 06/15/2020   Alcoholic pancreatitis    Allergies    Arthritis    BACK   Asthma    Depression    Frequency of urination    History of kidney stones    Hypertension    Leg pain    Left   Marijuana abuse 06/15/2020   Microhematuria    Right ureteral stone    Tobacco dependence 06/15/2020   Wears contact lenses     Past Surgical History:  Procedure Laterality Date   ANTERIOR LAT LUMBAR FUSION N/A 08/29/2017   Procedure: LUMBAR FOUR-FIVE LATERAL INTERBODY FUSION WITH INSTRUMENTATION AND ALLOGRAFT;  Surgeon: Estill Bamberg, MD;  Location: [facility] OR;  Service: Orthopedics;  Laterality: N/A;   APPLICATION OF WOUND VAC Right 06/09/2022   Procedure: APPLICATION OF WOUND VAC;  Surgeon: Edwin Cap, DPM;  Location: [facility] ORS;  Service: Podiatry;  Laterality: Right;   CYSTO/  URETEROSCOPIC STONE EXTRACTION  1998   CYSTOSCOPY WITH RETROGRADE PYELOGRAM, URETEROSCOPY AND STENT PLACEMENT Right 02/12/2015   Procedure: CYSTOSCOPY WITH RETROGRADE PYELOGRAM, URETEROSCOPY;  Surgeon: Jethro Bolus, MD;  Location[facility];  Service: Urology;  Laterality: Right;   DILITATION & CURRETTAGE/HYSTROSCOPY  WITH NOVASURE ABLATION N/A 12/05/2017   Procedure: DILATATION & CURETTAGE/HYSTEROSCOPY WITH NOVASURE ABLATION;  Surgeon: Myna Hidalgo, DO;  Location: [facility];  Service: Gynecology;  Laterality: N/A;   EXTRACORPOREAL SHOCK WAVE LITHOTRIPSY Right 08/08/2018   Procedure: EXTRACORPOREAL SHOCK WAVE LITHOTRIPSY (ESWL);  Surgeon: Heloise Purpura, MD;  Location: [facility];  Service: Urology;  Laterality: Right;   GRAFT APPLICATION Right 06/09/2022   Procedure: GRAFT APPLICATION;  Surgeon: Edwin Cap, DPM;  Location: [facility];  Service: Podiatry;  Laterality: Right;   LAPAROSCOPIC CHOLECYSTECTOMY  1994   LAPAROSCOPIC TUBAL LIGATION Bilateral 05-06-2009   fulgeration   POSTERIOR FUSION LUMBAR SPINE  10-28-2008   L5 - S1   STONE EXTRACTION WITH BASKET Right 02/12/2015   Procedure: STONE EXTRACTION WITH BASKET AND LASER LITHOTRIPSY;  Surgeon: Jethro Bolus, MD;  Location: [facility] ;  Service: Urology;  Laterality: Right;   TUBAL LIGATION     WOUND DEBRIDEMENT Right 06/09/2022   Procedure: DEBRIDEMENT WOUND;  Surgeon: Edwin Cap, DPM;  Location: [facility];  Service: Podiatry;  Laterality: Right;    Family History  Problem Relation Age of Onset   Pancreatitis Neg Hx     Social History   Socioeconomic History   Marital status: Married    Spouse name: Not on file   Number of children: Not on file   Years of education: Not on file   Highest education level: Not on file  Occupational  History   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  Tobacco Use   Smoking status: Every Day    Packs/day: 2.00    Years: 25.00    Additional pack years: 0.00    Total pack years: 50.00    Types: Cigarettes   Smokeless tobacco: Never  Vaping Use   Vaping Use: Never used  Substance and Sexual Activity   Alcohol use: Yes    Alcohol/week: 4.0 - 5.0 standard drinks of alcohol    Types: 4 - 5 Shots of liquor per week    Comment: binge drinking; recently drinking 5-7 days a week, heavily; +  tremors with withdrawal   Drug use: Yes    Types: Marijuana    Comment: regular use, last dose more than 1 month from 05/2022   Sexual activity: Not on file  Other Topics Concern   Not on file  Social History Narrative   Not on file   Social Determinants of Health   Financial Resource Strain: Not on file  Food Insecurity: Not on file  Transportation Needs: Not on file  Physical Activity: Not on file  Stress: Not on file  Social Connections: Not on file  Intimate Partner Violence: Not on file     Physical Exam   Vitals:   06/22/22 0200  BP: 137/86  Pulse: (!) 109  Resp: 19  Temp: 98.1 F (36.7 C)  SpO2: 98%    CONSTITUTIONAL: Well-appearing, NAD NEURO/PSYCH:  Alert and oriented x 3, no focal deficits EYES:  eyes equal and reactive ENT/NECK:  no LAD, no JVD CARDIO: Regular rate, well-perfused, normal S1 and S2 PULM:  CTAB no wheezing or rhonchi GI/GU:  non-distended, non-tender MSK/SPINE:  No gross deformities, no edema SKIN:  no rash, atraumatic   *Additional and/or pertinent findings included in MDM below  Diagnostic and Interventional Summary    EKG Interpretation  Date/Time:    Ventricular Rate:    PR Interval:    QRS Duration:   QT Interval:    QTC Calculation:   R Axis:     Text Interpretation:         Labs Reviewed - No data to display  No orders to display    Medications  dexamethasone (DECADRON) injection 10 mg (10 mg Intramuscular Given 06/22/22 0356)  diphenhydrAMINE (BENADRYL) capsule 50 mg (50 mg Oral Given 06/22/22 0355)  famotidine (PEPCID) tablet 20 mg (20 mg Oral Given 06/22/22 0355)     Procedures  /  Critical Care Procedures  ED Course and Medical Decision Making  Initial Impression and Ddx Suspect allergic hive-like rash, unknown trigger.  Patient has a cam boot and is also wearing a wound VAC on her right foot, poor healing from a bunion surgery.  I do not see any signs of cellulitis, the rash is diffuse and on her back so I do  not think that the rash is related to the dressing.  No signs or symptoms of angioedema or anaphylaxis.  Past medical/surgical history that increases complexity of ED encounter: None  Interpretation of Diagnostics Laboratory and/or imaging options to aid in the diagnosis/care of the patient were considered.  After careful history and physical examination, it was determined that there was no indication for diagnostics at this time.  Patient Reassessment and Ultimate Disposition/Management     Looks better and feels better after Decadron, antihistamines, appropriate for discharge with return precautions.  Patient management required discussion with the following services or consulting groups:  None  Complexity of Problems Addressed Acute  illness or injury that poses threat of life of bodily function  Additional Data Reviewed and Analyzed Further history obtained from: Further history from spouse/family member  Additional Factors Impacting ED Encounter Risk None  Elmer Sow. Pilar Plate, MD [facility] Emergency Medicine [facility] [email or web address]  Final Clinical Impressions(s) / ED Diagnoses     ICD-10-CM   1. Allergic reaction, initial encounter  T78.40XA       ED Discharge Orders     None        Discharge Instructions Discussed with and Provided to Patient:    Discharge Instructions      You were evaluated in the Emergency Department and after careful evaluation, we did not find any emergent condition requiring admission or further testing in the hospital.  Your exam/testing today is overall reassuring.  Symptoms seem to be due to an allergic reaction.  We gave you a steroid shot here in the emergency department.  Continue Benadryl every 4-6 hours as needed for rash.  Follow-up with your regular doctors/surgeon.  Please return to the Emergency Department if you experience any worsening of your condition.   Thank you for allowing [place] to be a part of your  care.      Sabas Sous, MD 06/22/22 [phone]

## 2022-06-22 NOTE — ED Triage Notes (Signed)
Noticed hives on back legs buttock. Itchy. Noticed yesterday.

## 2022-06-22 NOTE — Discharge Instructions (Signed)
You were evaluated in the Emergency Department and after careful evaluation, we did not find any emergent condition requiring admission or further testing in the hospital.  Your exam/testing today is overall reassuring.  Symptoms seem to be due to an allergic reaction.  We gave you a steroid shot here in the emergency department.  Continue Benadryl every 4-6 hours as needed for rash.  Follow-up with your regular doctors/surgeon.  Please return to the Emergency Department if you experience any worsening of your condition.   Thank you for allowing [place] to be a part of your care.

## 2022-06-22 NOTE — Telephone Encounter (Signed)
Home health called, stating the patient ran out of doxycycline on Tuesday and needs more called in. I will take care of this.  She had an appointment with Infectious Disease this week, but missed it due to not feeling well enough to go.  She called back and also noted the patient went to the ED this morning due to allergic rxn / rash all over her back.  They could not determine cause of rash and discharged her after treating with antihistamines.  I'll call Lillia Abed back and let her know the antibiotic was renewed.  -Ilan Kahrs

## 2022-06-23 DIAGNOSIS — S93301A Unspecified subluxation of right foot, initial encounter: Secondary | ICD-10-CM | POA: Diagnosis not present

## 2022-06-23 DIAGNOSIS — I10 Essential (primary) hypertension: Secondary | ICD-10-CM | POA: Diagnosis not present

## 2022-06-23 DIAGNOSIS — Z7901 Long term (current) use of anticoagulants: Secondary | ICD-10-CM | POA: Diagnosis not present

## 2022-06-23 DIAGNOSIS — F10230 Alcohol dependence with withdrawal, uncomplicated: Secondary | ICD-10-CM | POA: Diagnosis not present

## 2022-06-23 DIAGNOSIS — Z7951 Long term (current) use of inhaled steroids: Secondary | ICD-10-CM | POA: Diagnosis not present

## 2022-06-23 DIAGNOSIS — F32A Depression, unspecified: Secondary | ICD-10-CM | POA: Diagnosis not present

## 2022-06-23 DIAGNOSIS — T8131XA Disruption of external operation (surgical) wound, not elsewhere classified, initial encounter: Secondary | ICD-10-CM | POA: Diagnosis not present

## 2022-06-24 DIAGNOSIS — T8131XA Disruption of external operation (surgical) wound, not elsewhere classified, initial encounter: Secondary | ICD-10-CM | POA: Diagnosis not present

## 2022-06-24 DIAGNOSIS — S93301A Unspecified subluxation of right foot, initial encounter: Secondary | ICD-10-CM | POA: Diagnosis not present

## 2022-06-25 DIAGNOSIS — S93301A Unspecified subluxation of right foot, initial encounter: Secondary | ICD-10-CM | POA: Diagnosis not present

## 2022-06-25 DIAGNOSIS — T8131XA Disruption of external operation (surgical) wound, not elsewhere classified, initial encounter: Secondary | ICD-10-CM | POA: Diagnosis not present

## 2022-06-26 DIAGNOSIS — Z7901 Long term (current) use of anticoagulants: Secondary | ICD-10-CM | POA: Diagnosis not present

## 2022-06-26 DIAGNOSIS — S91311D Laceration without foreign body, right foot, subsequent encounter: Secondary | ICD-10-CM | POA: Diagnosis not present

## 2022-06-26 DIAGNOSIS — Z7951 Long term (current) use of inhaled steroids: Secondary | ICD-10-CM | POA: Diagnosis not present

## 2022-06-26 DIAGNOSIS — F32A Depression, unspecified: Secondary | ICD-10-CM | POA: Diagnosis not present

## 2022-06-26 DIAGNOSIS — S93301A Unspecified subluxation of right foot, initial encounter: Secondary | ICD-10-CM | POA: Diagnosis not present

## 2022-06-26 DIAGNOSIS — F10230 Alcohol dependence with withdrawal, uncomplicated: Secondary | ICD-10-CM | POA: Diagnosis not present

## 2022-06-26 DIAGNOSIS — I10 Essential (primary) hypertension: Secondary | ICD-10-CM | POA: Diagnosis not present

## 2022-06-26 DIAGNOSIS — T8131XA Disruption of external operation (surgical) wound, not elsewhere classified, initial encounter: Secondary | ICD-10-CM | POA: Diagnosis not present

## 2022-06-27 ENCOUNTER — Encounter: Payer: BC Managed Care – PPO | Admitting: Podiatry

## 2022-06-27 DIAGNOSIS — S93301A Unspecified subluxation of right foot, initial encounter: Secondary | ICD-10-CM | POA: Diagnosis not present

## 2022-06-27 DIAGNOSIS — T8131XA Disruption of external operation (surgical) wound, not elsewhere classified, initial encounter: Secondary | ICD-10-CM | POA: Diagnosis not present

## 2022-06-28 DIAGNOSIS — S93301A Unspecified subluxation of right foot, initial encounter: Secondary | ICD-10-CM | POA: Diagnosis not present

## 2022-06-28 DIAGNOSIS — Z7901 Long term (current) use of anticoagulants: Secondary | ICD-10-CM | POA: Diagnosis not present

## 2022-06-28 DIAGNOSIS — F10230 Alcohol dependence with withdrawal, uncomplicated: Secondary | ICD-10-CM | POA: Diagnosis not present

## 2022-06-28 DIAGNOSIS — T8131XA Disruption of external operation (surgical) wound, not elsewhere classified, initial encounter: Secondary | ICD-10-CM | POA: Diagnosis not present

## 2022-06-28 DIAGNOSIS — F32A Depression, unspecified: Secondary | ICD-10-CM | POA: Diagnosis not present

## 2022-06-28 DIAGNOSIS — I10 Essential (primary) hypertension: Secondary | ICD-10-CM | POA: Diagnosis not present

## 2022-06-28 DIAGNOSIS — Z7951 Long term (current) use of inhaled steroids: Secondary | ICD-10-CM | POA: Diagnosis not present

## 2022-06-29 ENCOUNTER — Ambulatory Visit (INDEPENDENT_AMBULATORY_CARE_PROVIDER_SITE_OTHER): Payer: BC Managed Care – PPO

## 2022-06-29 ENCOUNTER — Ambulatory Visit (INDEPENDENT_AMBULATORY_CARE_PROVIDER_SITE_OTHER): Payer: BC Managed Care – PPO | Admitting: Podiatry

## 2022-06-29 ENCOUNTER — Encounter: Payer: BC Managed Care – PPO | Admitting: Podiatry

## 2022-06-29 DIAGNOSIS — L97516 Non-pressure chronic ulcer of other part of right foot with bone involvement without evidence of necrosis: Secondary | ICD-10-CM

## 2022-06-29 DIAGNOSIS — S93301A Unspecified subluxation of right foot, initial encounter: Secondary | ICD-10-CM | POA: Diagnosis not present

## 2022-06-29 DIAGNOSIS — T8131XA Disruption of external operation (surgical) wound, not elsewhere classified, initial encounter: Secondary | ICD-10-CM | POA: Diagnosis not present

## 2022-06-29 DIAGNOSIS — Z9889 Other specified postprocedural states: Secondary | ICD-10-CM | POA: Diagnosis not present

## 2022-06-29 DIAGNOSIS — M960 Pseudarthrosis after fusion or arthrodesis: Secondary | ICD-10-CM

## 2022-06-30 DIAGNOSIS — Z7901 Long term (current) use of anticoagulants: Secondary | ICD-10-CM | POA: Diagnosis not present

## 2022-06-30 DIAGNOSIS — T8131XA Disruption of external operation (surgical) wound, not elsewhere classified, initial encounter: Secondary | ICD-10-CM | POA: Diagnosis not present

## 2022-06-30 DIAGNOSIS — S93301A Unspecified subluxation of right foot, initial encounter: Secondary | ICD-10-CM | POA: Diagnosis not present

## 2022-06-30 DIAGNOSIS — F32A Depression, unspecified: Secondary | ICD-10-CM | POA: Diagnosis not present

## 2022-06-30 DIAGNOSIS — Z7951 Long term (current) use of inhaled steroids: Secondary | ICD-10-CM | POA: Diagnosis not present

## 2022-06-30 DIAGNOSIS — I10 Essential (primary) hypertension: Secondary | ICD-10-CM | POA: Diagnosis not present

## 2022-06-30 DIAGNOSIS — F10230 Alcohol dependence with withdrawal, uncomplicated: Secondary | ICD-10-CM | POA: Diagnosis not present

## 2022-07-01 DIAGNOSIS — T8131XA Disruption of external operation (surgical) wound, not elsewhere classified, initial encounter: Secondary | ICD-10-CM | POA: Diagnosis not present

## 2022-07-01 DIAGNOSIS — S93301A Unspecified subluxation of right foot, initial encounter: Secondary | ICD-10-CM | POA: Diagnosis not present

## 2022-07-02 DIAGNOSIS — S93301A Unspecified subluxation of right foot, initial encounter: Secondary | ICD-10-CM | POA: Diagnosis not present

## 2022-07-02 DIAGNOSIS — T8131XA Disruption of external operation (surgical) wound, not elsewhere classified, initial encounter: Secondary | ICD-10-CM | POA: Diagnosis not present

## 2022-07-02 NOTE — Progress Notes (Signed)
  Subjective:  Patient ID: Suzanne Jackson, female    DOB: May 01, 1970,  MRN: [id number]  Chief Complaint  Patient presents with   Routine Post Op    POV # 3 DOS 05/15/22 --- REVISION OF LAPIDUS RIGHT FOOT WITH BONE GRAFT AND MARROW ASPIRATION     52 y.o. female returns for post-op check.  She returns for follow-up, the nurse has been changing the wound VAC and dressing.  She had to reschedule her infectious disease follow-up due to not feeling well  Review of Systems: Negative except as noted in the HPI. Denies N/V/F/Ch.   Objective:  There were no vitals filed for this visit. There is no height or weight on file to calculate BMI. Constitutional Well developed. Well nourished.  Vascular Foot warm and well perfused. Capillary refill normal to all digits.  Calf is soft and supple, no posterior calf or knee pain, negative Homans' sign  Neurologic Normal speech. Oriented to person, place, and time. Epicritic sensation to light touch grossly present bilaterally.  Dermatologic Following removal of the silicone layer there is good granulation tissue forming on the periphery.  Graft is still intact over the plate but shows minimal granulation, no signs of infection are noted  Orthopedic: Tenderness to palpation noted about the surgical site.    Aerobic culture 06/09/2022 final no growth Anaerobic and fungal cultures from this date were lost from operating room to microbiology  New radiographs taken today show unchanged alignment, no complication of hardware, appears to have early consolidation of graft site  No growth from wound culture 06/15/2022 6  Assessment:   1. Status post right foot surgery   2. Ulcer of right foot with bone involvement without evidence of necrosis (HCC)   3. Pseudarthrosis after joint fusion    Plan:  Patient was evaluated and treated and all questions answered.  S/p foot surgery right -We reviewed her x-rays.  I removed the silicone layer from the Integra  graft today.  Shows good granulation tissue on the periphery.  Likely still along the course of the head for soft tissue healing.  I would like to reevaluate the percentage of bony healing with a CT scan and this has been ordered.  If there is good consolidation around the graft sites we will be able to remove the internal hardware and radiographed the soft tissue defect.  Return to have it reevaluated in 3 weeks.  Return in about 3 weeks (around 07/20/2022) for wound care.

## 2022-07-03 ENCOUNTER — Telehealth: Payer: Self-pay | Admitting: Podiatry

## 2022-07-03 DIAGNOSIS — F32A Depression, unspecified: Secondary | ICD-10-CM | POA: Diagnosis not present

## 2022-07-03 DIAGNOSIS — Z7951 Long term (current) use of inhaled steroids: Secondary | ICD-10-CM | POA: Diagnosis not present

## 2022-07-03 DIAGNOSIS — I10 Essential (primary) hypertension: Secondary | ICD-10-CM | POA: Diagnosis not present

## 2022-07-03 DIAGNOSIS — F10230 Alcohol dependence with withdrawal, uncomplicated: Secondary | ICD-10-CM | POA: Diagnosis not present

## 2022-07-03 DIAGNOSIS — T8131XA Disruption of external operation (surgical) wound, not elsewhere classified, initial encounter: Secondary | ICD-10-CM | POA: Diagnosis not present

## 2022-07-03 DIAGNOSIS — Z7901 Long term (current) use of anticoagulants: Secondary | ICD-10-CM | POA: Diagnosis not present

## 2022-07-03 DIAGNOSIS — S93301A Unspecified subluxation of right foot, initial encounter: Secondary | ICD-10-CM | POA: Diagnosis not present

## 2022-07-03 NOTE — Telephone Encounter (Signed)
Pt is requesting a Dr note to get a refund on tickets since she's unable to travel due to her foot.   Please advise

## 2022-07-04 ENCOUNTER — Encounter: Payer: Self-pay | Admitting: Podiatry

## 2022-07-04 DIAGNOSIS — S93301A Unspecified subluxation of right foot, initial encounter: Secondary | ICD-10-CM | POA: Diagnosis not present

## 2022-07-04 DIAGNOSIS — T8131XA Disruption of external operation (surgical) wound, not elsewhere classified, initial encounter: Secondary | ICD-10-CM | POA: Diagnosis not present

## 2022-07-05 ENCOUNTER — Ambulatory Visit (HOSPITAL_COMMUNITY)
Admission: RE | Admit: 2022-07-05 | Discharge: 2022-07-05 | Disposition: A | Discharge status: Home / Self Care | Payer: BC Managed Care – PPO | Source: Ambulatory Visit | Attending: Podiatry | Admitting: Podiatry

## 2022-07-05 ENCOUNTER — Telehealth: Payer: Self-pay | Admitting: Podiatry

## 2022-07-05 DIAGNOSIS — S93301A Unspecified subluxation of right foot, initial encounter: Secondary | ICD-10-CM | POA: Diagnosis not present

## 2022-07-05 DIAGNOSIS — T8131XA Disruption of external operation (surgical) wound, not elsewhere classified, initial encounter: Secondary | ICD-10-CM | POA: Diagnosis not present

## 2022-07-05 DIAGNOSIS — I10 Essential (primary) hypertension: Secondary | ICD-10-CM | POA: Diagnosis not present

## 2022-07-05 DIAGNOSIS — F10230 Alcohol dependence with withdrawal, uncomplicated: Secondary | ICD-10-CM | POA: Diagnosis not present

## 2022-07-05 DIAGNOSIS — Z7901 Long term (current) use of anticoagulants: Secondary | ICD-10-CM | POA: Diagnosis not present

## 2022-07-05 DIAGNOSIS — F32A Depression, unspecified: Secondary | ICD-10-CM | POA: Diagnosis not present

## 2022-07-05 DIAGNOSIS — Z7951 Long term (current) use of inhaled steroids: Secondary | ICD-10-CM | POA: Diagnosis not present

## 2022-07-05 LAB — VAS US PAD ABI
Left ABI: 1.25
Right ABI: 1.21

## 2022-07-05 NOTE — Telephone Encounter (Signed)
Meagan with [facility] called saying pt's wound is now less than 3 cm in length by their measurement. Wants to know if they can discontinue the wound vac and start doing wet to dry dressings daily.

## 2022-07-06 NOTE — Telephone Encounter (Signed)
Thank you Dr. Wagoner!

## 2022-07-07 ENCOUNTER — Other Ambulatory Visit: Payer: BC Managed Care – PPO

## 2022-07-09 NOTE — Progress Notes (Deleted)
Reason for Infectious Disease Consult:  Requesting Physician: Sharl Ma, DPM   Subjective:    Patient ID: Suzanne Jackson, female    DOB: 1970/12/12, 52 y.o.   MRN: [id number]  HPI  52 y.o. female with a history of significant tobacco use and had undergone a previous bunionectomy.    She developed a postoperative abscess in late December of 2023 and underwent I and D on doxy + cipro with MSSA isolated.     this resulted in malunion of the metatarsal.  She returned to the OR for revision April 29 of this year and presented to the office for her second and third postoperative visits with gross wound dehiscence and surgical site infection.   She was taken to the OR on 06/09/2022 and underwent.  Procedures: Procedures:   * DEBRIDEMENT WOUND   * GRAFT APPLICATION   * APPLICATION OF WOUND VAC  In addition to the dehisced wound there was exposed hardware.   Cultures were taken but no organism had grown   Past Medical History:  Diagnosis Date   Alcohol dependence with uncomplicated withdrawal (HCC) 06/15/2020   Alcoholic pancreatitis    Allergies    Arthritis    BACK   Asthma    Depression    Frequency of urination    History of kidney stones    Hypertension    Leg pain    Left   Marijuana abuse 06/15/2020   Microhematuria    Right ureteral stone    Tobacco dependence 06/15/2020   Wears contact lenses     Past Surgical History:  Procedure Laterality Date   ANTERIOR LAT LUMBAR FUSION N/A 08/29/2017   Procedure: LUMBAR FOUR-FIVE LATERAL INTERBODY FUSION WITH INSTRUMENTATION AND ALLOGRAFT;  Surgeon: Estill Bamberg, MD;  Location: [facility];  Service: Orthopedics;  Laterality: N/A;   APPLICATION OF WOUND VAC Right 06/09/2022   Procedure: APPLICATION OF WOUND VAC;  Surgeon: Edwin Cap, DPM;  Location: [facility];  Service: Podiatry;  Laterality: Right;   CYSTO/  URETEROSCOPIC STONE EXTRACTION  1998   CYSTOSCOPY WITH RETROGRADE PYELOGRAM, URETEROSCOPY AND STENT  PLACEMENT Right 02/12/2015   Procedure: CYSTOSCOPY WITH RETROGRADE PYELOGRAM, URETEROSCOPY;  Surgeon: Jethro Bolus, MD;  Location: [facility];  Service: Urology;  Laterality: Right;   DILITATION & CURRETTAGE/HYSTROSCOPY WITH NOVASURE ABLATION N/A 12/05/2017   Procedure: DILATATION & CURETTAGE/HYSTEROSCOPY WITH NOVASURE ABLATION;  Surgeon: Myna Hidalgo, DO;  Location: [facility];  Service: Gynecology;  Laterality: N/A;   EXTRACORPOREAL SHOCK WAVE LITHOTRIPSY Right 08/08/2018   Procedure: EXTRACORPOREAL SHOCK WAVE LITHOTRIPSY (ESWL);  Surgeon: Heloise Purpura, MD;  Location: [facility];  Service: Urology;  Laterality: Right;   GRAFT APPLICATION Right 06/09/2022   Procedure: GRAFT APPLICATION;  Surgeon: Edwin Cap, DPM;  Location: [facility];  Service: Podiatry;  Laterality: Right;   LAPAROSCOPIC CHOLECYSTECTOMY  1994   LAPAROSCOPIC TUBAL LIGATION Bilateral 05-06-2009   fulgeration   POSTERIOR FUSION LUMBAR SPINE  10-28-2008   L5 - S1   STONE EXTRACTION WITH BASKET Right 02/12/2015   Procedure: STONE EXTRACTION WITH BASKET AND LASER LITHOTRIPSY;  Surgeon: Jethro Bolus, MD;  Location: [facility];  Service: Urology;  Laterality: Right;   TUBAL LIGATION     WOUND DEBRIDEMENT Right 06/09/2022   Procedure: DEBRIDEMENT WOUND;  Surgeon: Edwin Cap, DPM;  Location: [facility];  Service: Podiatry;  Laterality: Right;    Family History  Problem Relation Age of Onset   Pancreatitis Neg Hx  Social History   Socioeconomic History   Marital status: Married    Spouse name: Not on file   Number of children: Not on file   Years of education: Not on file   Highest education level: Not on file  Occupational History   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  Tobacco Use   Smoking status: Every Day    Packs/day: 2.00    Years: 25.00    Additional pack years: 0.00    Total pack years: 50.00    Types: Cigarettes   Smokeless tobacco: Never  Vaping Use    Vaping Use: Never used  Substance and Sexual Activity   Alcohol use: Yes    Alcohol/week: 4.0 - 5.0 standard drinks of alcohol    Types: 4 - 5 Shots of liquor per week    Comment: binge drinking; recently drinking 5-7 days a week, heavily; + tremors with withdrawal   Drug use: Yes    Types: Marijuana    Comment: regular use, last dose more than 1 month from 05/2022   Sexual activity: Not on file  Other Topics Concern   Not on file  Social History Narrative   Not on file   Social Determinants of Health   Financial Resource Strain: Not on file  Food Insecurity: Not on file  Transportation Needs: Not on file  Physical Activity: Not on file  Stress: Not on file  Social Connections: Not on file    Allergies  Allergen Reactions   Aspirin Anaphylaxis   Ibuprofen Anaphylaxis   Percocet [Oxycodone-Acetaminophen] Itching    Pt states okay to take with hydroxyzine   Aleve [Naproxen]     Other reaction(s): wheeze   Bupropion     Other reaction(s): anger     Current Outpatient Medications:    amLODipine (NORVASC) 10 MG tablet, Take 10 mg by mouth daily. , Disp: , Rfl: 6   budesonide-formoterol (SYMBICORT) 160-4.5 MCG/ACT inhaler, Inhale 1 puff into the lungs 2 (two) times daily., Disp: , Rfl:    cephALEXin (KEFLEX) 500 MG capsule, Take 1 capsule (500 mg total) by mouth 3 (three) times daily. (Patient not taking: Reported on 06/07/2022), Disp: 30 capsule, Rfl: 0   Disulfiram 500 MG TABS, Take 1 tablet (500 mg total) by mouth daily. (Patient not taking: Reported on 06/07/2022), Disp: 30 tablet, Rfl: 0   doxycycline (VIBRA-TABS) 100 MG tablet, Take 1 tablet (100 mg total) by mouth 2 (two) times daily., Disp: 30 tablet, Rfl: 0   DULoxetine (CYMBALTA) 60 MG capsule, Take 60 mg by mouth daily., Disp: , Rfl:    fexofenadine (ALLEGRA) 180 MG tablet, Take 180 mg by mouth daily., Disp: , Rfl:    gabapentin (NEURONTIN) 300 MG capsule, Take 1 capsule (300 mg total) by mouth 3 (three) times daily  for 7 days., Disp: 21 capsule, Rfl: 0   gentamicin ointment (GARAMYCIN) 0.1 %, Apply 1 Application topically daily., Disp: 30 g, Rfl: 0   hydrochlorothiazide (MICROZIDE) 12.5 MG capsule, Take 12.5 mg by mouth daily., Disp: , Rfl: 0   hydrOXYzine (ATARAX/VISTARIL) 50 MG tablet, Take 1 tablet (50 mg total) by mouth every 6 (six) hours as needed (refractory itching)., Disp: 30 tablet, Rfl: 0   montelukast (SINGULAIR) 10 MG tablet, Take 10 mg by mouth every morning. , Disp: , Rfl:    mupirocin ointment (BACTROBAN) 2 %, Apply 1 Application topically 2 (two) times daily. (Patient not taking: Reported on 06/07/2022), Disp: 30 g, Rfl: 2   oxyCODONE-acetaminophen (PERCOCET) 5-325 MG tablet,  Take 1 tablet by mouth every 4 (four) hours as needed for severe pain., Disp: 20 tablet, Rfl: 0   potassium chloride SA (K-DUR,KLOR-CON) 20 MEQ tablet, Take 20 mEq by mouth daily., Disp: , Rfl:    rivaroxaban (XARELTO) 10 MG TABS tablet, Take 1 tablet (10 mg total) by mouth daily., Disp: 30 tablet, Rfl: 0   Review of Systems     Objective:   Physical Exam        Assessment & Plan:

## 2022-07-10 ENCOUNTER — Ambulatory Visit: Payer: BC Managed Care – PPO | Admitting: Infectious Disease

## 2022-07-10 ENCOUNTER — Encounter: Payer: Self-pay | Admitting: Infectious Disease

## 2022-07-10 DIAGNOSIS — S91301A Unspecified open wound, right foot, initial encounter: Secondary | ICD-10-CM

## 2022-07-10 DIAGNOSIS — M86379 Chronic multifocal osteomyelitis, unspecified ankle and foot: Secondary | ICD-10-CM

## 2022-07-10 DIAGNOSIS — F17200 Nicotine dependence, unspecified, uncomplicated: Secondary | ICD-10-CM

## 2022-07-10 DIAGNOSIS — T84498A Other mechanical complication of other internal orthopedic devices, implants and grafts, initial encounter: Secondary | ICD-10-CM

## 2022-07-10 DIAGNOSIS — Z8619 Personal history of other infectious and parasitic diseases: Secondary | ICD-10-CM

## 2022-07-10 HISTORY — DX: Personal history of other infectious and parasitic diseases: Z86.19

## 2022-07-12 DIAGNOSIS — T8131XA Disruption of external operation (surgical) wound, not elsewhere classified, initial encounter: Secondary | ICD-10-CM | POA: Diagnosis not present

## 2022-07-12 DIAGNOSIS — Z7951 Long term (current) use of inhaled steroids: Secondary | ICD-10-CM | POA: Diagnosis not present

## 2022-07-12 DIAGNOSIS — F32A Depression, unspecified: Secondary | ICD-10-CM | POA: Diagnosis not present

## 2022-07-12 DIAGNOSIS — I10 Essential (primary) hypertension: Secondary | ICD-10-CM | POA: Diagnosis not present

## 2022-07-12 DIAGNOSIS — Z7901 Long term (current) use of anticoagulants: Secondary | ICD-10-CM | POA: Diagnosis not present

## 2022-07-12 DIAGNOSIS — F10230 Alcohol dependence with withdrawal, uncomplicated: Secondary | ICD-10-CM | POA: Diagnosis not present

## 2022-07-13 ENCOUNTER — Telehealth: Payer: Self-pay | Admitting: *Deleted

## 2022-07-13 ENCOUNTER — Ambulatory Visit
Admission: RE | Admit: 2022-07-13 | Discharge: 2022-07-13 | Disposition: A | Discharge status: Home / Self Care | Payer: BC Managed Care – PPO | Source: Ambulatory Visit | Attending: Podiatry | Admitting: Podiatry

## 2022-07-13 DIAGNOSIS — M960 Pseudarthrosis after fusion or arthrodesis: Secondary | ICD-10-CM

## 2022-07-13 DIAGNOSIS — Z969 Presence of functional implant, unspecified: Secondary | ICD-10-CM | POA: Diagnosis not present

## 2022-07-13 DIAGNOSIS — M810 Age-related osteoporosis without current pathological fracture: Secondary | ICD-10-CM | POA: Diagnosis not present

## 2022-07-13 DIAGNOSIS — L97516 Non-pressure chronic ulcer of other part of right foot with bone involvement without evidence of necrosis: Secondary | ICD-10-CM

## 2022-07-13 NOTE — Telephone Encounter (Signed)
Megan w/ home health is wanting to let the physician know that  they removed the wound vac last week, completed the wet to dry dressing . They had noticed during a visit that the foot is more swollen w/ increased pain. She is 4 days post doxycycline. She did the edema management with patient, had a fall one day ago but patient said that she did not injure foot, is scheduled for CT today ,is not bearing weight on the foot. F/u appointment is 07/26/22.

## 2022-07-13 NOTE — Telephone Encounter (Signed)
Patient is wanting to know if she may take a shower.

## 2022-07-17 NOTE — Telephone Encounter (Signed)
Called and spoke with Aundra Millet, giving recommendations to wait until her f/u appointment before showering.

## 2022-07-17 NOTE — Telephone Encounter (Signed)
Yes, she is scheduled to see Dr Ralene Cork on 07/10

## 2022-07-19 DIAGNOSIS — Z7951 Long term (current) use of inhaled steroids: Secondary | ICD-10-CM | POA: Diagnosis not present

## 2022-07-19 DIAGNOSIS — Z7901 Long term (current) use of anticoagulants: Secondary | ICD-10-CM | POA: Diagnosis not present

## 2022-07-19 DIAGNOSIS — F10230 Alcohol dependence with withdrawal, uncomplicated: Secondary | ICD-10-CM | POA: Diagnosis not present

## 2022-07-19 DIAGNOSIS — I10 Essential (primary) hypertension: Secondary | ICD-10-CM | POA: Diagnosis not present

## 2022-07-19 DIAGNOSIS — F32A Depression, unspecified: Secondary | ICD-10-CM | POA: Diagnosis not present

## 2022-07-19 DIAGNOSIS — T8131XA Disruption of external operation (surgical) wound, not elsewhere classified, initial encounter: Secondary | ICD-10-CM | POA: Diagnosis not present

## 2022-07-25 DIAGNOSIS — R197 Diarrhea, unspecified: Secondary | ICD-10-CM | POA: Diagnosis not present

## 2022-07-25 DIAGNOSIS — R000 Tachycardia, unspecified: Secondary | ICD-10-CM | POA: Diagnosis not present

## 2022-07-25 DIAGNOSIS — E860 Dehydration: Secondary | ICD-10-CM | POA: Diagnosis not present

## 2022-07-26 ENCOUNTER — Encounter: Payer: Self-pay | Admitting: Podiatry

## 2022-07-26 ENCOUNTER — Ambulatory Visit (INDEPENDENT_AMBULATORY_CARE_PROVIDER_SITE_OTHER): Payer: BC Managed Care – PPO | Admitting: Podiatry

## 2022-07-26 VITALS — BP 144/86 | HR 97

## 2022-07-26 DIAGNOSIS — Z9889 Other specified postprocedural states: Secondary | ICD-10-CM

## 2022-07-26 DIAGNOSIS — T8189XD Other complications of procedures, not elsewhere classified, subsequent encounter: Secondary | ICD-10-CM

## 2022-07-26 NOTE — Progress Notes (Signed)
  Subjective:  Patient ID: Suzanne Jackson, female    DOB: 04-02-1970,  MRN: [id number]  Chief Complaint  Patient presents with   Post-op Follow-up     52 y.o. female returns for post-op check.  She returns for follow-up, the nurses discotninued the vac and have been doing wet to dry dressing and she has had her CT. Has appointment coming up with ID.   Review of Systems: Negative except as noted in the HPI. Denies N/V/F/Ch.   Objective:   Vitals:   07/26/22 1119  BP: (!) 144/86  Pulse: 97   There is no height or weight on file to calculate BMI. Constitutional Well developed. Well nourished.  Vascular Foot warm and well perfused. Capillary refill normal to all digits.  Calf is soft and supple, no posterior calf or knee pain, negative Homans' sign  Neurologic Normal speech. Oriented to person, place, and time. Epicritic sensation to light touch grossly present bilaterally.  Dermatologic Dehsicence wound with granular base and appears to be filling in somewhat. There is rolled edges of wound. No signs of infection are noted  Orthopedic: Tenderness to palpation noted about the surgical site.    Aerobic culture 06/09/2022 final no growth Anaerobic and fungal cultures from this date were lost from operating room to microbiology   No growth from wound culture 06/15/2022    IMPRESSION: 1. Early partial osseous bridging between the base of the first metatarsal and the bone graft. Minimal osseous bridging between the bone graft and the medial cuneiform. 2. Fixating hardware in place without complicating features. 3. Moderate diffuse osteoporosis which may be disuse related. 4. No acute bony findings.  Assessment:   1. Status post right foot surgery   2. Delayed surgical wound healing, subsequent encounter     Plan:  Patient was evaluated and treated and all questions answered.  S/p foot surgery right -CT scan reviewed with patient. Discussed she is getting some fusion at  part of the joint but not fully fused. Will discuss with Dr. Lilian Kapur and she will discuss with her at next visits next steps moving forward.  -Dressing changed  -Continue with dressing changes daily and continue to keep the foot dry and not get wet.   Return to have it reevaluated in 3 weeks.  No follow-ups on file.

## 2022-07-27 DIAGNOSIS — S91311D Laceration without foreign body, right foot, subsequent encounter: Secondary | ICD-10-CM | POA: Diagnosis not present

## 2022-07-27 DIAGNOSIS — T8131XA Disruption of external operation (surgical) wound, not elsewhere classified, initial encounter: Secondary | ICD-10-CM | POA: Diagnosis not present

## 2022-07-27 DIAGNOSIS — Z7901 Long term (current) use of anticoagulants: Secondary | ICD-10-CM | POA: Diagnosis not present

## 2022-07-27 DIAGNOSIS — F10230 Alcohol dependence with withdrawal, uncomplicated: Secondary | ICD-10-CM | POA: Diagnosis not present

## 2022-07-27 DIAGNOSIS — I10 Essential (primary) hypertension: Secondary | ICD-10-CM | POA: Diagnosis not present

## 2022-07-27 DIAGNOSIS — F32A Depression, unspecified: Secondary | ICD-10-CM | POA: Diagnosis not present

## 2022-07-27 DIAGNOSIS — Z7951 Long term (current) use of inhaled steroids: Secondary | ICD-10-CM | POA: Diagnosis not present

## 2022-08-01 DIAGNOSIS — Z7951 Long term (current) use of inhaled steroids: Secondary | ICD-10-CM | POA: Diagnosis not present

## 2022-08-01 DIAGNOSIS — F10230 Alcohol dependence with withdrawal, uncomplicated: Secondary | ICD-10-CM | POA: Diagnosis not present

## 2022-08-01 DIAGNOSIS — T8131XA Disruption of external operation (surgical) wound, not elsewhere classified, initial encounter: Secondary | ICD-10-CM | POA: Diagnosis not present

## 2022-08-01 DIAGNOSIS — I10 Essential (primary) hypertension: Secondary | ICD-10-CM | POA: Diagnosis not present

## 2022-08-01 DIAGNOSIS — Z7901 Long term (current) use of anticoagulants: Secondary | ICD-10-CM | POA: Diagnosis not present

## 2022-08-01 DIAGNOSIS — F32A Depression, unspecified: Secondary | ICD-10-CM | POA: Diagnosis not present

## 2022-08-01 NOTE — Progress Notes (Signed)
DOS  12/23/2021  Bunion correction of right foot with Lapidus procedure; second hammertoe correction and metatarsal bone cut; bone graft from heel

## 2022-08-09 DIAGNOSIS — Z7901 Long term (current) use of anticoagulants: Secondary | ICD-10-CM | POA: Diagnosis not present

## 2022-08-09 DIAGNOSIS — Z7951 Long term (current) use of inhaled steroids: Secondary | ICD-10-CM | POA: Diagnosis not present

## 2022-08-09 DIAGNOSIS — T8131XA Disruption of external operation (surgical) wound, not elsewhere classified, initial encounter: Secondary | ICD-10-CM | POA: Diagnosis not present

## 2022-08-09 DIAGNOSIS — F10230 Alcohol dependence with withdrawal, uncomplicated: Secondary | ICD-10-CM | POA: Diagnosis not present

## 2022-08-09 DIAGNOSIS — F32A Depression, unspecified: Secondary | ICD-10-CM | POA: Diagnosis not present

## 2022-08-09 DIAGNOSIS — I10 Essential (primary) hypertension: Secondary | ICD-10-CM | POA: Diagnosis not present

## 2022-08-14 ENCOUNTER — Telehealth: Payer: Self-pay | Admitting: Podiatry

## 2022-08-14 DIAGNOSIS — Z7901 Long term (current) use of anticoagulants: Secondary | ICD-10-CM | POA: Diagnosis not present

## 2022-08-14 DIAGNOSIS — I10 Essential (primary) hypertension: Secondary | ICD-10-CM | POA: Diagnosis not present

## 2022-08-14 DIAGNOSIS — T8131XA Disruption of external operation (surgical) wound, not elsewhere classified, initial encounter: Secondary | ICD-10-CM | POA: Diagnosis not present

## 2022-08-14 DIAGNOSIS — F32A Depression, unspecified: Secondary | ICD-10-CM | POA: Diagnosis not present

## 2022-08-14 DIAGNOSIS — Z7951 Long term (current) use of inhaled steroids: Secondary | ICD-10-CM | POA: Diagnosis not present

## 2022-08-14 DIAGNOSIS — F10230 Alcohol dependence with withdrawal, uncomplicated: Secondary | ICD-10-CM | POA: Diagnosis not present

## 2022-08-14 DIAGNOSIS — L97516 Non-pressure chronic ulcer of other part of right foot with bone involvement without evidence of necrosis: Secondary | ICD-10-CM

## 2022-08-14 NOTE — Telephone Encounter (Signed)
The nurse called [facility], pt needs a  new order for week home health please contact nurse for wound care changes Suzanne Jackson [phone] please call

## 2022-08-14 NOTE — Addendum Note (Signed)
Addended byLilian Kapur, Kimyata Milich R on: 08/14/2022 04:53 PM   Modules accepted: Orders

## 2022-08-15 ENCOUNTER — Ambulatory Visit: Payer: BC Managed Care – PPO | Admitting: Internal Medicine

## 2022-08-16 DIAGNOSIS — S91311D Laceration without foreign body, right foot, subsequent encounter: Secondary | ICD-10-CM | POA: Diagnosis not present

## 2022-08-17 ENCOUNTER — Ambulatory Visit (INDEPENDENT_AMBULATORY_CARE_PROVIDER_SITE_OTHER): Payer: BC Managed Care – PPO

## 2022-08-17 ENCOUNTER — Ambulatory Visit (INDEPENDENT_AMBULATORY_CARE_PROVIDER_SITE_OTHER): Payer: BC Managed Care – PPO | Admitting: Podiatry

## 2022-08-17 ENCOUNTER — Encounter: Payer: Self-pay | Admitting: Podiatry

## 2022-08-17 DIAGNOSIS — T8131XA Disruption of external operation (surgical) wound, not elsewhere classified, initial encounter: Secondary | ICD-10-CM

## 2022-08-17 DIAGNOSIS — Z9889 Other specified postprocedural states: Secondary | ICD-10-CM

## 2022-08-17 DIAGNOSIS — L97516 Non-pressure chronic ulcer of other part of right foot with bone involvement without evidence of necrosis: Secondary | ICD-10-CM

## 2022-08-17 DIAGNOSIS — M960 Pseudarthrosis after fusion or arthrodesis: Secondary | ICD-10-CM

## 2022-08-20 ENCOUNTER — Encounter: Payer: Self-pay | Admitting: Podiatry

## 2022-08-20 NOTE — Progress Notes (Signed)
  Subjective:  Patient ID: Suzanne Jackson, female    DOB: 10-07-1970,  MRN: [id number]  Chief Complaint  Patient presents with   Routine Post Op    "It's been hurting."     52 y.o. female returns for post-op check.  She returns for follow-up, she feels that it is doing okay, still has not put any weight on it, they are changing the dressing daily with wet-to-dry dressings.  Review of Systems: Negative except as noted in the HPI. Denies N/V/F/Ch.   Objective:  There were no vitals filed for this visit. There is no height or weight on file to calculate BMI. Constitutional Well developed. Well nourished.  Vascular Foot warm and well perfused. Capillary refill normal to all digits.  Calf is soft and supple, no posterior calf or knee pain, negative Homans' sign  Neurologic Normal speech. Oriented to person, place, and time. Epicritic sensation to light touch grossly present bilaterally.  Dermatologic Small area of ulceration remaining over the dorsal incision, 12 mm x 3 mm x 3 mm.  No signs of infection.  Subcutaneous tissue exposed.  Orthopedic: Very little edema or pain to palpation    Aerobic culture 06/09/2022 final no growth Anaerobic and fungal cultures from this date were lost from operating room to microbiology  New radiographs taken today show unchanged alignment, no complication of hardware, appears to have early consolidation of graft site  No growth from wound culture 06/15/2022 6  CT shows good consolidation across the metatarsal to distal graft site, the cuneiform to proximal graft site only has partial consolidation  Assessment:   1. Dehiscence of operative wound, initial encounter   2. Ulcer of right foot with bone involvement without evidence of necrosis (HCC)   3. Pseudarthrosis after joint fusion    Plan:  Patient was evaluated and treated and all questions answered.  S/p foot surgery right -We reviewed the results of today's x-rays and her CT scan.  She  has good consolidation happening from the graft to the metatarsal but less on the cuneiform to the graft site.  Thankfully there is no loss of correction or hardware complication.  Her wound is healing well and is quite small at this point.  I debrided the wound of nonviable tissue and a collagen powder was applied.  She will changes daily.  She still has not followed up with infectious disease to any of her appointments that have been rescheduled, at this point I will recheck her lab work to see if there are any persistent elevations in her inflammatory markers which I am doubtful there will be due to her appropriate soft tissue healing, she does have delayed union of the proximal graft site unfortunately she will continue to lysing the bone stimulation and I will let her know if the lab work is concerning for infection which could also be a source of potential nonunion.  Follow-up in 3 weeks for ongoing wound care.  She may begin utilizing the heel for transfers and partial weightbearing with crutches, no weight through the forefoot yet or walking without support  Return in about 3 weeks (around 09/07/2022) for wound care.

## 2022-08-21 DIAGNOSIS — F10230 Alcohol dependence with withdrawal, uncomplicated: Secondary | ICD-10-CM | POA: Diagnosis not present

## 2022-08-21 DIAGNOSIS — F32A Depression, unspecified: Secondary | ICD-10-CM | POA: Diagnosis not present

## 2022-08-21 DIAGNOSIS — I10 Essential (primary) hypertension: Secondary | ICD-10-CM | POA: Diagnosis not present

## 2022-08-21 DIAGNOSIS — Z7951 Long term (current) use of inhaled steroids: Secondary | ICD-10-CM | POA: Diagnosis not present

## 2022-08-21 DIAGNOSIS — T8131XD Disruption of external operation (surgical) wound, not elsewhere classified, subsequent encounter: Secondary | ICD-10-CM | POA: Diagnosis not present

## 2022-08-23 DIAGNOSIS — S91311D Laceration without foreign body, right foot, subsequent encounter: Secondary | ICD-10-CM | POA: Diagnosis not present

## 2022-08-30 DIAGNOSIS — I10 Essential (primary) hypertension: Secondary | ICD-10-CM | POA: Diagnosis not present

## 2022-08-30 DIAGNOSIS — F32A Depression, unspecified: Secondary | ICD-10-CM | POA: Diagnosis not present

## 2022-08-30 DIAGNOSIS — F10230 Alcohol dependence with withdrawal, uncomplicated: Secondary | ICD-10-CM | POA: Diagnosis not present

## 2022-08-30 DIAGNOSIS — T8131XD Disruption of external operation (surgical) wound, not elsewhere classified, subsequent encounter: Secondary | ICD-10-CM | POA: Diagnosis not present

## 2022-08-30 DIAGNOSIS — Z7951 Long term (current) use of inhaled steroids: Secondary | ICD-10-CM | POA: Diagnosis not present

## 2022-09-04 DIAGNOSIS — T8131XD Disruption of external operation (surgical) wound, not elsewhere classified, subsequent encounter: Secondary | ICD-10-CM | POA: Diagnosis not present

## 2022-09-04 DIAGNOSIS — F10230 Alcohol dependence with withdrawal, uncomplicated: Secondary | ICD-10-CM | POA: Diagnosis not present

## 2022-09-04 DIAGNOSIS — F32A Depression, unspecified: Secondary | ICD-10-CM | POA: Diagnosis not present

## 2022-09-04 DIAGNOSIS — I10 Essential (primary) hypertension: Secondary | ICD-10-CM | POA: Diagnosis not present

## 2022-09-04 DIAGNOSIS — Z7951 Long term (current) use of inhaled steroids: Secondary | ICD-10-CM | POA: Diagnosis not present

## 2022-09-07 ENCOUNTER — Ambulatory Visit: Payer: BC Managed Care – PPO | Admitting: Podiatry

## 2022-09-20 DIAGNOSIS — I10 Essential (primary) hypertension: Secondary | ICD-10-CM | POA: Diagnosis not present

## 2022-09-20 DIAGNOSIS — F32A Depression, unspecified: Secondary | ICD-10-CM | POA: Diagnosis not present

## 2022-09-20 DIAGNOSIS — T8131XD Disruption of external operation (surgical) wound, not elsewhere classified, subsequent encounter: Secondary | ICD-10-CM | POA: Diagnosis not present

## 2022-09-20 DIAGNOSIS — F10230 Alcohol dependence with withdrawal, uncomplicated: Secondary | ICD-10-CM | POA: Diagnosis not present

## 2022-09-20 DIAGNOSIS — Z7951 Long term (current) use of inhaled steroids: Secondary | ICD-10-CM | POA: Diagnosis not present

## 2022-09-25 ENCOUNTER — Ambulatory Visit (INDEPENDENT_AMBULATORY_CARE_PROVIDER_SITE_OTHER): Payer: BC Managed Care – PPO

## 2022-09-25 ENCOUNTER — Ambulatory Visit (INDEPENDENT_AMBULATORY_CARE_PROVIDER_SITE_OTHER): Payer: BC Managed Care – PPO | Admitting: Podiatry

## 2022-09-25 DIAGNOSIS — M960 Pseudarthrosis after fusion or arthrodesis: Secondary | ICD-10-CM | POA: Diagnosis not present

## 2022-09-25 DIAGNOSIS — L97516 Non-pressure chronic ulcer of other part of right foot with bone involvement without evidence of necrosis: Secondary | ICD-10-CM | POA: Diagnosis not present

## 2022-09-25 DIAGNOSIS — T8131XA Disruption of external operation (surgical) wound, not elsewhere classified, initial encounter: Secondary | ICD-10-CM | POA: Diagnosis not present

## 2022-09-25 NOTE — Progress Notes (Signed)
  Subjective:  Patient ID: Suzanne Jackson, female    DOB: 02-Feb-1970,  MRN: [id number]  Chief Complaint  Patient presents with   Wound Check    Wound from post op     52 y.o. female returns for post-op check.  She returns for follow-up, she is still having some pain and swelling she is focusing putting weight on her right heel in the boot only  Review of Systems: Negative except as noted in the HPI. Denies N/V/F/Ch.   Objective:  There were no vitals filed for this visit. There is no height or weight on file to calculate BMI. Constitutional Well developed. Well nourished.  Vascular Foot warm and well perfused. Capillary refill normal to all digits.  Calf is soft and supple, no posterior calf or knee pain, negative Homans' sign  Neurologic Normal speech. Oriented to person, place, and time. Epicritic sensation to light touch grossly present bilaterally.  Dermatologic Ulcer has completely healed with complete epithelialization no drainage or signs of infection  Orthopedic: No edema to examination today, no pain to palpation    Aerobic culture 06/09/2022 final no growth Anaerobic and fungal cultures from this date were lost from operating room to microbiology  New radiographs taken today show unchanged alignment, no complication of hardware, slight increase in proximal graft integration site No growth from wound culture 06/15/2022 6  CT shows good consolidation across the metatarsal to distal graft site, the cuneiform to proximal graft site only has partial consolidation  Assessment:   1. Ulcer of right foot with bone involvement without evidence of necrosis (HCC)   2. Pseudarthrosis after joint fusion    Plan:  Patient was evaluated and treated and all questions answered.  S/p foot surgery right -Reviewed today's radiographs.  There is some increased bony fusion.  I do think a new CT scan 3 months after her last 1 is warranted to reassess the percentage of healing to allow  her to transition to full weightbearing in the boot.  I have ordered this and she will schedule it for 4 weeks from now.  I will see her back in 6 weeks to reevaluate.  She did not go get her lab work done after the last visit as they had an outburst of COVID in her family.  She will go today to get it done.  Her wound is healed and there are no signs of infection she may resume regular showering bathing does not need to dress the foot and may swim in a pool but cannot put full pressure on the forefoot without the boot on just yet but floating and putting her legs in the water is fine.  Also discussed short drives 10 to 15 minutes only are acceptable in a surgical shoe but if any of these activities increased pain swelling or discomfort she should cease immediately.  We also again reiterated the importance of ceasing smoking which she has struggled with.  I will see her back in 6 weeks after the CT  Return in about 6 weeks (around 11/06/2022) for surgery follow up and CT review.

## 2022-09-26 ENCOUNTER — Encounter: Payer: Self-pay | Admitting: Podiatry

## 2022-09-26 NOTE — Addendum Note (Signed)
Addended byLilian Kapur, Montravious Weigelt R on: 09/26/2022 09:17 AM   Modules accepted: Orders

## 2022-09-27 ENCOUNTER — Other Ambulatory Visit: Payer: BC Managed Care – PPO

## 2022-09-27 ENCOUNTER — Encounter: Payer: Self-pay | Admitting: Podiatry

## 2022-09-29 DIAGNOSIS — F10230 Alcohol dependence with withdrawal, uncomplicated: Secondary | ICD-10-CM | POA: Diagnosis not present

## 2022-09-29 DIAGNOSIS — T8131XD Disruption of external operation (surgical) wound, not elsewhere classified, subsequent encounter: Secondary | ICD-10-CM | POA: Diagnosis not present

## 2022-09-29 DIAGNOSIS — F32A Depression, unspecified: Secondary | ICD-10-CM | POA: Diagnosis not present

## 2022-09-29 DIAGNOSIS — Z7951 Long term (current) use of inhaled steroids: Secondary | ICD-10-CM | POA: Diagnosis not present

## 2022-09-29 DIAGNOSIS — I10 Essential (primary) hypertension: Secondary | ICD-10-CM | POA: Diagnosis not present

## 2022-10-19 ENCOUNTER — Other Ambulatory Visit: Payer: BC Managed Care – PPO

## 2022-10-23 ENCOUNTER — Other Ambulatory Visit: Payer: BC Managed Care – PPO

## 2022-10-25 ENCOUNTER — Ambulatory Visit
Admission: RE | Admit: 2022-10-25 | Discharge: 2022-10-25 | Disposition: A | Discharge status: Home / Self Care | Payer: BC Managed Care – PPO | Source: Ambulatory Visit | Attending: Podiatry | Admitting: Podiatry

## 2022-10-25 DIAGNOSIS — Z4789 Encounter for other orthopedic aftercare: Secondary | ICD-10-CM | POA: Diagnosis not present

## 2022-10-25 DIAGNOSIS — M960 Pseudarthrosis after fusion or arthrodesis: Secondary | ICD-10-CM

## 2022-11-06 ENCOUNTER — Encounter: Payer: Self-pay | Admitting: Podiatry

## 2022-11-06 ENCOUNTER — Ambulatory Visit (INDEPENDENT_AMBULATORY_CARE_PROVIDER_SITE_OTHER): Payer: BC Managed Care – PPO | Admitting: Podiatry

## 2022-11-06 ENCOUNTER — Ambulatory Visit (INDEPENDENT_AMBULATORY_CARE_PROVIDER_SITE_OTHER): Payer: BC Managed Care – PPO

## 2022-11-06 VITALS — Ht 61.0 in | Wt 118.0 lb

## 2022-11-06 DIAGNOSIS — M960 Pseudarthrosis after fusion or arthrodesis: Secondary | ICD-10-CM

## 2022-11-06 DIAGNOSIS — L97516 Non-pressure chronic ulcer of other part of right foot with bone involvement without evidence of necrosis: Secondary | ICD-10-CM

## 2022-11-08 NOTE — Progress Notes (Signed)
Subjective:  Patient ID: Suzanne Jackson, female    DOB: January 15, 1971,  MRN: [id number]  Chief Complaint  Patient presents with   Routine Post Op    6 week surgery f/u and CT scan review     52 y.o. female returns for post-op check.  She returns for follow-up, she is not having much pain has not had any drainage the scar still does not look like completely normal skin yet  Review of Systems: Negative except as noted in the HPI. Denies N/V/F/Ch.   Objective:  There were no vitals filed for this visit. Body mass index is 22.3 kg/m. Constitutional Well developed. Well nourished.  Vascular Foot warm and well perfused. Capillary refill normal to all digits.  Calf is soft and supple, no posterior calf or knee pain, negative Homans' sign  Neurologic Normal speech. Oriented to person, place, and time. Epicritic sensation to light touch grossly present bilaterally.  Dermatologic Ulceration has healed without drainage or signs of infection  Orthopedic: No edema to examination today, no pain to palpation    Aerobic culture 06/09/2022 final no growth Anaerobic and fungal cultures from this date were lost from operating room to microbiology  New radiographs taken today show unchanged alignment, no complication of hardware, slight increase in proximal graft integration site No growth from wound culture 06/15/2022 6  CT shows good consolidation across the metatarsal to distal graft site, the cuneiform to proximal graft site only has partial consolidation  Follow-up CT scan on 10/25/2022 completed but not read yet my evaluation of this shows complete consolidation of metatarsal to graft, graft to cuneiform consolidation is present in the dorsal portion of the joint but still has quite a ways to go in the plantar portions of the joint.  Assessment:   1. Ulcer of right foot with bone involvement without evidence of necrosis (HCC)   2. Pseudarthrosis after joint fusion    Plan:  Patient was  evaluated and treated and all questions answered.  S/p foot surgery right -We reviewed her CT scan, there is some increasing consolidation no complication of hardware fracture or hardware loosening.  She has no signs of infection.  Her recent lab work was normal in regards to her ESR CRP and CBC.  I do think she can begin to gradually have full body weight on the boot as tolerated.  She will let me know if there are any worsening pain swelling or complications from this.  I will see her back in 8 weeks for new radiographs.  Return in about 8 weeks (around 01/01/2023) for surgery follow up with new xrays.

## 2022-11-16 DIAGNOSIS — Z01419 Encounter for gynecological examination (general) (routine) without abnormal findings: Secondary | ICD-10-CM | POA: Diagnosis not present

## 2022-11-16 DIAGNOSIS — Z124 Encounter for screening for malignant neoplasm of cervix: Secondary | ICD-10-CM | POA: Diagnosis not present

## 2023-01-02 ENCOUNTER — Ambulatory Visit: Payer: BC Managed Care – PPO | Admitting: Podiatry

## 2023-01-29 ENCOUNTER — Ambulatory Visit: Payer: BC Managed Care – PPO | Admitting: Podiatry

## 2023-02-06 ENCOUNTER — Ambulatory Visit: Payer: BC Managed Care – PPO | Admitting: Podiatry

## 2023-02-12 ENCOUNTER — Ambulatory Visit: Payer: BC Managed Care – PPO | Admitting: Podiatry

## 2023-02-12 DIAGNOSIS — F1020 Alcohol dependence, uncomplicated: Secondary | ICD-10-CM | POA: Diagnosis not present

## 2023-02-13 DIAGNOSIS — F1020 Alcohol dependence, uncomplicated: Secondary | ICD-10-CM | POA: Diagnosis not present

## 2023-02-19 DIAGNOSIS — F1020 Alcohol dependence, uncomplicated: Secondary | ICD-10-CM | POA: Diagnosis not present

## 2023-03-06 ENCOUNTER — Ambulatory Visit: Payer: BC Managed Care – PPO | Admitting: Podiatry

## 2023-03-09 ENCOUNTER — Other Ambulatory Visit: Payer: Self-pay

## 2023-03-09 ENCOUNTER — Telehealth: Payer: Self-pay | Admitting: Acute Care

## 2023-03-09 DIAGNOSIS — F17210 Nicotine dependence, cigarettes, uncomplicated: Secondary | ICD-10-CM

## 2023-03-09 DIAGNOSIS — Z87891 Personal history of nicotine dependence: Secondary | ICD-10-CM

## 2023-03-09 DIAGNOSIS — Z122 Encounter for screening for malignant neoplasm of respiratory organs: Secondary | ICD-10-CM

## 2023-03-09 NOTE — Telephone Encounter (Signed)
Lung Cancer Screening Narrative/Criteria Questionnaire (Cigarette Smokers Only- No Cigars/Pipes/vapes)   Suzanne Jackson   SDMV:04/10/23 at 1230p /KRISTEN                                           April 28, 1970              LDCT: 04/11/23 at 3pm / [name]    53 y.o.   Phone: [phone]  Lung Screening Narrative (confirm age 57-77 yrs Medicare / 50-80 yrs Private pay insurance)   Insurance information:BCBS   Referring Provider:Dean   This screening involves an initial phone call with a team member from our program. It is called a shared decision making visit. The initial meeting is required by insurance and Medicare to make sure you understand the program. This appointment takes about 15-20 minutes to complete. The CT scan will completed at a separate date/time. This scan takes about 5-10 minutes to complete and you may eat and drink before and after the scan.  Criteria questions for Lung Cancer Screening:   Are you a current or former smoker? Current Age began smoking: 53 yo  (quit x 3 yrs and restarted)   If you are a former smoker, what year did you quit smoking?NA (within 15 yrs)   To calculate your smoking history, I need an accurate estimate of how many packs of cigarettes you smoked per day and for how many years. (Not just the number of PPD you are now smoking)   Years smoking 36 x Packs per day 1-2 = Pack years 53   (at least 20 pack yrs)   (Make sure they understand that we need to know how much they have smoked in the past, not just the number of PPD they are smoking now)  Do you have a personal history of cancer?  No    Do you have a family history of cancer? Yes  (cancer type and and relative) father/colon  Are you coughing up blood?  No  Have you had unexplained weight loss of 15 lbs or more in the last 6 months? No  It looks like you meet all criteria.     Additional information: N/A

## 2023-03-12 DIAGNOSIS — L570 Actinic keratosis: Secondary | ICD-10-CM | POA: Diagnosis not present

## 2023-03-12 DIAGNOSIS — L821 Other seborrheic keratosis: Secondary | ICD-10-CM | POA: Diagnosis not present

## 2023-03-12 DIAGNOSIS — D492 Neoplasm of unspecified behavior of bone, soft tissue, and skin: Secondary | ICD-10-CM | POA: Diagnosis not present

## 2023-03-12 DIAGNOSIS — C44722 Squamous cell carcinoma of skin of right lower limb, including hip: Secondary | ICD-10-CM | POA: Diagnosis not present

## 2023-03-13 DIAGNOSIS — Q453 Other congenital malformations of pancreas and pancreatic duct: Secondary | ICD-10-CM | POA: Diagnosis not present

## 2023-03-13 DIAGNOSIS — Z13220 Encounter for screening for lipoid disorders: Secondary | ICD-10-CM | POA: Diagnosis not present

## 2023-03-13 DIAGNOSIS — I10 Essential (primary) hypertension: Secondary | ICD-10-CM | POA: Diagnosis not present

## 2023-03-15 DIAGNOSIS — E876 Hypokalemia: Secondary | ICD-10-CM | POA: Diagnosis not present

## 2023-03-15 DIAGNOSIS — F322 Major depressive disorder, single episode, severe without psychotic features: Secondary | ICD-10-CM | POA: Diagnosis not present

## 2023-03-15 DIAGNOSIS — I10 Essential (primary) hypertension: Secondary | ICD-10-CM | POA: Diagnosis not present

## 2023-03-15 DIAGNOSIS — J45909 Unspecified asthma, uncomplicated: Secondary | ICD-10-CM | POA: Diagnosis not present

## 2023-03-26 DIAGNOSIS — C44722 Squamous cell carcinoma of skin of right lower limb, including hip: Secondary | ICD-10-CM | POA: Diagnosis not present

## 2023-04-10 ENCOUNTER — Encounter: Payer: BC Managed Care – PPO | Admitting: Acute Care

## 2023-04-10 ENCOUNTER — Ambulatory Visit: Payer: BC Managed Care – PPO | Admitting: Podiatry

## 2023-04-10 ENCOUNTER — Ambulatory Visit (INDEPENDENT_AMBULATORY_CARE_PROVIDER_SITE_OTHER)

## 2023-04-10 ENCOUNTER — Encounter: Payer: Self-pay | Admitting: Podiatry

## 2023-04-10 DIAGNOSIS — M960 Pseudarthrosis after fusion or arthrodesis: Secondary | ICD-10-CM | POA: Diagnosis not present

## 2023-04-10 DIAGNOSIS — L97516 Non-pressure chronic ulcer of other part of right foot with bone involvement without evidence of necrosis: Secondary | ICD-10-CM

## 2023-04-10 NOTE — Progress Notes (Signed)
  Subjective:  Patient ID: Suzanne Jackson, female    DOB: 17-May-1970,  MRN: [id number]  Chief Complaint  Patient presents with   Routine Post Op    Rm1: Return in about 8 weeks (around 01/01/2023) for surgery follow up with new xrays. Pt states she has pain whenever she walks more than usual and the pain increases to about a 5. Right foot is a little swollen she says when she wears a certain pair of shoes she notices the swelling.She recently underwent therapy for skin cancer on right thigh.     53 y.o. female returns for post-op check.  She returns for follow-up, she wore the boot and surgical shoe until about January and then is back in regular shoes. Review of Systems: Negative except as noted in the HPI. Denies N/V/F/Ch.   Objective:  There were no vitals filed for this visit. There is no height or weight on file to calculate BMI. Constitutional Well developed. Well nourished.  Vascular Foot warm and well perfused. Capillary refill normal to all digits.  Calf is soft and supple, no posterior calf or knee pain, negative Homans' sign  Neurologic Normal speech. Oriented to person, place, and time. Epicritic sensation to light touch grossly present bilaterally.  Dermatologic Incision is well healed with minimal hypertrophy  Orthopedic: No edema to examination today, no pain to palpation    Aerobic culture 06/09/2022 final no growth Anaerobic and fungal cultures from this date were lost from operating room to microbiology  New radiographs taken today show increased consolidation no fracture of hardware or aseptic loosening  No growth from wound culture 06/15/2022   CT shows good consolidation across the metatarsal to distal graft site, the cuneiform to proximal graft site only has partial consolidation  Follow-up CT scan on 10/25/2022 completed but not read yet my evaluation of this shows complete consolidation of metatarsal to graft, graft to cuneiform consolidation is present in  the dorsal portion of the joint but still has quite a ways to go in the plantar portions of the joint.  Assessment:   1. Ulcer of right foot with bone involvement without evidence of necrosis (HCC)   2. Pseudarthrosis after joint fusion    Plan:  Patient was evaluated and treated and all questions answered.  S/p foot surgery right -We reviewed today's radiographs.  She does have increasing consolidation there likely is some partial nonunion of the graft metatarsal interface.  This seems to be minimally symptomatic for her.  She is fairly comfortable in regular shoe gear with only occasional pain.  She may continue regular shoe gear as tolerated.  Likely does not need to continue bone stimulation at this point.  If any worsening pain here or in the MTP joint return to see me as needed.  Return if symptoms worsen or fail to improve.

## 2023-04-10 NOTE — Progress Notes (Signed)
 Attempted to reach pt 3 times, left message to call office to reschedule [facility]

## 2023-04-11 ENCOUNTER — Ambulatory Visit: Payer: BC Managed Care – PPO

## 2023-04-26 DIAGNOSIS — C44729 Squamous cell carcinoma of skin of left lower limb, including hip: Secondary | ICD-10-CM | POA: Diagnosis not present

## 2023-04-26 DIAGNOSIS — D225 Melanocytic nevi of trunk: Secondary | ICD-10-CM | POA: Diagnosis not present

## 2023-04-26 DIAGNOSIS — Z7189 Other specified counseling: Secondary | ICD-10-CM | POA: Diagnosis not present

## 2023-04-26 DIAGNOSIS — L821 Other seborrheic keratosis: Secondary | ICD-10-CM | POA: Diagnosis not present

## 2023-04-26 DIAGNOSIS — D492 Neoplasm of unspecified behavior of bone, soft tissue, and skin: Secondary | ICD-10-CM | POA: Diagnosis not present

## 2023-04-26 DIAGNOSIS — L814 Other melanin hyperpigmentation: Secondary | ICD-10-CM | POA: Diagnosis not present

## 2023-05-07 ENCOUNTER — Encounter (HOSPITAL_BASED_OUTPATIENT_CLINIC_OR_DEPARTMENT_OTHER): Payer: Self-pay | Admitting: Emergency Medicine

## 2023-05-07 ENCOUNTER — Other Ambulatory Visit: Payer: Self-pay

## 2023-05-07 ENCOUNTER — Emergency Department (HOSPITAL_BASED_OUTPATIENT_CLINIC_OR_DEPARTMENT_OTHER)

## 2023-05-07 ENCOUNTER — Emergency Department (HOSPITAL_BASED_OUTPATIENT_CLINIC_OR_DEPARTMENT_OTHER): Admission: EM | Admit: 2023-05-07 | Discharge: 2023-05-07 | Disposition: A | Discharge status: Home / Self Care

## 2023-05-07 DIAGNOSIS — J45901 Unspecified asthma with (acute) exacerbation: Secondary | ICD-10-CM | POA: Diagnosis not present

## 2023-05-07 DIAGNOSIS — J4521 Mild intermittent asthma with (acute) exacerbation: Secondary | ICD-10-CM | POA: Diagnosis not present

## 2023-05-07 DIAGNOSIS — R0602 Shortness of breath: Secondary | ICD-10-CM | POA: Diagnosis not present

## 2023-05-07 LAB — CBC
HCT: 37.8 % (ref 36.0–46.0)
Hemoglobin: 13.3 g/dL (ref 12.0–15.0)
MCH: 32.1 pg (ref 26.0–34.0)
MCHC: 35.2 g/dL (ref 30.0–36.0)
MCV: 91.3 fL (ref 80.0–100.0)
Platelets: 130 10*3/uL — ABNORMAL LOW (ref 150–400)
RBC: 4.14 MIL/uL (ref 3.87–5.11)
RDW: 18.6 % — ABNORMAL HIGH (ref 11.5–15.5)
WBC: 8.7 10*3/uL (ref 4.0–10.5)
nRBC: 0 % (ref 0.0–0.2)

## 2023-05-07 LAB — BASIC METABOLIC PANEL WITH GFR
Anion gap: 15 (ref 5–15)
BUN: <5 mg/dL — ABNORMAL LOW (ref 6–20)
CO2: 25 mmol/L (ref 22–32)
Calcium: 8.5 mg/dL — ABNORMAL LOW (ref 8.9–10.3)
Chloride: 96 mmol/L — ABNORMAL LOW (ref 98–111)
Creatinine, Ser: 0.44 mg/dL (ref 0.44–1.00)
GFR, Estimated: >60 mL/min (ref >60)
Glucose, Bld: 105 mg/dL — ABNORMAL HIGH (ref 70–99)
Potassium: 2.6 mmol/L — CL (ref 3.5–5.1)
Sodium: 136 mmol/L (ref 135–145)

## 2023-05-07 MED ORDER — ONDANSETRON 4 MG PO TBDP
4.0000 mg | ORAL_TABLET | Freq: Once | ORAL | Status: AC
  Administered 2023-05-07: 4 mg via ORAL
  Filled 2023-05-07: qty 1

## 2023-05-07 MED ORDER — ALBUTEROL SULFATE (2.5 MG/3ML) 0.083% IN NEBU: 2.5000 mg | INHALATION_SOLUTION | Freq: Once | RESPIRATORY_TRACT | Status: AC

## 2023-05-07 MED ORDER — ALBUTEROL SULFATE (2.5 MG/3ML) 0.083% IN NEBU
INHALATION_SOLUTION | RESPIRATORY_TRACT | Status: AC
  Administered 2023-05-07: 2.5 mg via RESPIRATORY_TRACT
  Filled 2023-05-07: qty 3

## 2023-05-07 MED ORDER — ALBUTEROL SULFATE HFA 108 (90 BASE) MCG/ACT IN AERS
2.0000 | INHALATION_SPRAY | RESPIRATORY_TRACT | Status: DC | PRN
  Administered 2023-05-07: 2 via RESPIRATORY_TRACT
  Filled 2023-05-07: qty 6.7

## 2023-05-07 MED ORDER — POTASSIUM CHLORIDE CRYS ER 20 MEQ PO TBCR
40.0000 meq | EXTENDED_RELEASE_TABLET | Freq: Once | ORAL | Status: AC
  Administered 2023-05-07: 40 meq via ORAL
  Filled 2023-05-07: qty 2

## 2023-05-07 MED ORDER — IPRATROPIUM-ALBUTEROL 0.5-2.5 (3) MG/3ML IN SOLN
RESPIRATORY_TRACT | Status: AC
Start: 2023-05-07 — End: 2023-05-07
  Administered 2023-05-07: 3 mL via RESPIRATORY_TRACT
  Filled 2023-05-07: qty 3

## 2023-05-07 MED ORDER — PREDNISONE 10 MG PO TABS: 40.0000 mg | ORAL_TABLET | Freq: Every day | ORAL | 0 refills | Status: AC

## 2023-05-07 MED ORDER — PREDNISONE 50 MG PO TABS
60.0000 mg | ORAL_TABLET | Freq: Once | ORAL | Status: AC
  Administered 2023-05-07: 60 mg via ORAL
  Filled 2023-05-07: qty 1

## 2023-05-07 MED ORDER — IPRATROPIUM-ALBUTEROL 0.5-2.5 (3) MG/3ML IN SOLN: 3.0000 mL | Freq: Once | RESPIRATORY_TRACT | Status: AC

## 2023-05-07 NOTE — ED Provider Notes (Signed)
 [place] EMERGENCY DEPARTMENT AT [facility] Provider Note   CSN: [id number] Arrival date & time: 05/07/23  1054     History  Chief Complaint  Patient presents with   Shortness of Breath    Suzanne Jackson is a 53 y.o. female.  Patient is a 53 year old female presents emergency department for shortness of breath.  Symptoms started overnight.  Taking home rescue inhalers without improvement.  Has been outside several times the past few days.  This feels like typical asthma exacerbation for her.  Not having chest pain lightheadedness, no abdominal pain.  Went to [facility] urgent [facility] and was sent here.   Shortness of Breath      Home Medications Prior to Admission medications   Medication Sig Start Date End Date Taking? Authorizing Provider  predniSONE  (DELTASONE ) 10 MG tablet Take 4 tablets (40 mg total) by mouth daily for 3 days. 05/08/23 05/11/23 Yes Peace Noyes, Lajean Pike, DO  amLODipine  (NORVASC ) 10 MG tablet Take 10 mg by mouth daily.  10/11/16   [provider]  budesonide-formoterol  (SYMBICORT) 160-4.5 MCG/ACT inhaler Inhale 1 puff into the lungs 2 (two) times daily.    [provider]  Disulfiram  500 MG TABS Take 1 tablet (500 mg total) by mouth daily. 06/18/20   Vann, Jessica U, DO  DULoxetine  (CYMBALTA ) 60 MG capsule Take 60 mg by mouth daily. 09/18/19   [provider]  fexofenadine (ALLEGRA) 180 MG tablet Take 180 mg by mouth daily.    [provider]  hydrochlorothiazide  (MICROZIDE ) 12.5 MG capsule Take 12.5 mg by mouth daily. 10/11/16   [provider]  hydrOXYzine  (ATARAX /VISTARIL ) 50 MG tablet Take 1 tablet (50 mg total) by mouth every 6 (six) hours as needed (refractory itching). 08/31/17   McKenzie, Kayla J, PA-C  montelukast  (SINGULAIR ) 10 MG tablet Take 10 mg by mouth every morning.  11/21/12   [provider]  mupirocin  ointment (BACTROBAN ) 2 % Apply 1 Application topically 2 (two) times daily. 01/18/22   McDonald,  Olive Better, DPM  oxyCODONE -acetaminophen  (PERCOCET) 5-325 MG tablet Take 1 tablet by mouth every 4 (four) hours as needed for severe pain. 06/09/22 06/09/23  McDonald, Olive Better, DPM  potassium chloride  SA (K-DUR,KLOR-CON ) 20 MEQ tablet Take 20 mEq by mouth daily.    [provider]      Allergies    Aspirin, Ibuprofen, Percocet [oxycodone -acetaminophen ], Aleve [naproxen], and Bupropion    Review of Systems   Review of Systems  Respiratory:  Positive for shortness of breath.     Physical Exam Updated Vital Signs BP (!) 147/88   Pulse 95   Temp 99.1 F (37.3 C)   Resp 17   SpO2 93%  Physical Exam Vitals and nursing note reviewed.  Constitutional:      General: She is not in acute distress.    Appearance: She is not toxic-appearing.  HENT:     Head: Normocephalic.  Cardiovascular:     Rate and Rhythm: Normal rate and regular rhythm.  Pulmonary:     Effort: Tachypnea and accessory muscle usage present.     Breath sounds: Decreased breath sounds present. No wheezing.  Musculoskeletal:     Cervical back: Normal range of motion.     Right lower leg: No edema.     Left lower leg: No edema.  Skin:    General: Skin is warm.     Capillary Refill: Capillary refill takes less than 2 seconds.  Neurological:     Mental Status:  She is alert and oriented to person, place, and time.  Psychiatric:        Mood and Affect: Mood normal.        Behavior: Behavior normal.     ED Results / Procedures / Treatments   Labs (all labs ordered are listed, but only abnormal results are displayed) Labs Reviewed  CBC - Abnormal; Notable for the following components:      Result Value   RDW 18.6 (*)    Platelets 130 (*)    All other components within normal limits  BASIC METABOLIC PANEL WITH GFR - Abnormal; Notable for the following components:   Potassium 2.6 (*)    Chloride 96 (*)    Glucose, Bld 105 (*)    BUN <5 (*)    Calcium 8.5 (*)    All other components within normal limits     EKG EKG Interpretation Date/Time:  Monday May 07 2023 11:02:59 EDT Ventricular Rate:  102 PR Interval:  174 QRS Duration:  89 QT Interval:  367 QTC Calculation: 479 R Axis:   21  Text Interpretation: Sinus tachycardia Ventricular premature complex LAE, consider biatrial enlargement Left ventricular hypertrophy Anterior infarct, old Abnormal T, consider ischemia, lateral leads Confirmed by Elise Guile [phone]) on 05/07/2023 3:35:37 PM  Radiology DG Chest Portable 1 View Result Date: 05/07/2023 CLINICAL DATA:  SHOB, asthma EXAM: PORTABLE CHEST 1 VIEW COMPARISON:  Jun 15, 2020 FINDINGS: Trace bilateral pleural effusions. Small region of nodularity in the right lung base adjacent to the costophrenic angle blunting. No pneumothorax. Mild cardiomegaly. Aortic atherosclerosis. No acute fracture or destructive lesions. Multilevel thoracic osteophytosis. IMPRESSION: Trace bilateral pleural effusions. Small area of nodularity in the right lung base adjacent to the costophrenic angle blunting, possibly adjacent atelectasis. A small lung nodule could also have this appearance in the correct clinical context. A nonemergent chest CT with IV contrast is recommended for further characterization. Electronically Signed   By: Rance Burrows M.D.   On: 05/07/2023 14:41    Procedures Procedures    Medications Ordered in ED Medications  albuterol  (VENTOLIN  HFA) 108 (90 Base) MCG/ACT inhaler 2 puff (2 puffs Inhalation Given by Other 05/07/23 1134)  albuterol  (PROVENTIL ) (2.5 MG/3ML) 0.083% nebulizer solution 2.5 mg (2.5 mg Nebulization Given 05/07/23 1106)  ipratropium-albuterol  (DUONEB) 0.5-2.5 (3) MG/3ML nebulizer solution 3 mL (3 mLs Nebulization Given 05/07/23 1107)  predniSONE  (DELTASONE ) tablet 60 mg (60 mg Oral Given 05/07/23 1135)  potassium chloride  SA (KLOR-CON  M) CR tablet 40 mEq (40 mEq Oral Given 05/07/23 1231)  ondansetron  (ZOFRAN -ODT) disintegrating tablet 4 mg (4 mg Oral Given 05/07/23 1231)     ED Course/ Medical Decision Making/ A&P Clinical Course as of 05/07/23 1536  Mon May 07, 2023  1217 Hemoglobin: 13.3 Not currently having melena or blood in stools.  [TY]  1217 Potassium(!!): 2.6 Suspect transient secondary to albuterol  use. [TY]  1302 Reevaluated.  Feels back to baseline in terms of her shortness of breath. [TY]  1534 Continues to feel well.  Discussed chest x-ray findings with pulmonary nodule.  She has upcoming CT scan.  She has upcoming appointment with GI regarding her blood in stool.  Will give short course of steroids.  Discussed albuterol  use and was given strict return precautions. [TY]    Clinical Course User Index [TY] Rolinda Climes, DO  Medical Decision Making Is a 53 year old female presenting emergency department for shortness of breath in the setting of asthma.  She is afebrile, mildly elevated heart rate.  Appears to be in mild respiratory distress with increased work of breathing, poor air movement.  Will give breathing treatment and prednisone .  Patient's husband stated that she has had some intermittent bloody bowel movements.  Patient notes that it has been several days since she has had maroon-colored stool.  Will get CBC/BMP.  Amount and/or Complexity of Data Reviewed Independent Historian:     Details: See above External Data Reviewed:     Details: Has upcoming CT chest given her smoking history Labs: ordered. Decision-making details documented in ED Course.    Details: See ED course Radiology: ordered.    Details: Will get chest x-ray to evaluate for superimposed pneumonia  Risk Prescription drug management. Decision regarding hospitalization. Diagnosis or treatment significantly limited by social determinants of health. Risk Details: Poor health literacy          Final Clinical Impression(s) / ED Diagnoses Final diagnoses:  Mild asthma with exacerbation, unspecified whether persistent    Rx  / DC Orders ED Discharge Orders          Ordered    predniSONE  (DELTASONE ) 10 MG tablet  Daily        05/07/23 1457              Rolinda Climes, DO 05/07/23 1536

## 2023-05-07 NOTE — ED Triage Notes (Signed)
 C/o SHOB since yesterday. Hx of asthma., NAD. Denies CP.

## 2023-05-07 NOTE — Discharge Instructions (Signed)
 Please take your albuterol  inhaler every 4 hours for the next 24 hours and then every 6 hours as needed thereafter.  We are prescribing you steroids.  It does appear on your chest x-ray there was a lung nodule.  Please follow-up with your primary doctor as radiology is recommending a CT of your chest on an outpatient basis.  Please return if develop fevers, chills, chest pain, worsening shortness of breath, abdominal pain, persistent blood in stool or you pass out.  You may also return if develop any new or worsening symptoms that are concerning to you.

## 2023-05-07 NOTE — ED Notes (Signed)
 Pt asked to speak with provider prior to d/c to discuss results. EDP bedside speaking to patient at this time

## 2023-05-07 NOTE — ED Notes (Signed)
 Rt educated the Pt on a mask spacer.

## 2023-05-08 ENCOUNTER — Ambulatory Visit (INDEPENDENT_AMBULATORY_CARE_PROVIDER_SITE_OTHER): Admitting: Acute Care

## 2023-05-08 ENCOUNTER — Encounter

## 2023-05-08 DIAGNOSIS — F17210 Nicotine dependence, cigarettes, uncomplicated: Secondary | ICD-10-CM

## 2023-05-08 NOTE — Progress Notes (Addendum)
  Provider Attestation I agree with the documentation of the Shared Decision Making visit,  smoking cessation counseling if appropriate, and verification or eligibility for lung cancer screening as documented by the RN Nurse Navigator.   Raejean Bullock, MSN, AGACNP-BC [place] [facility] See Amion for personal pager PCCM on call pager [phone]    Virtual Visit via Telephone Note  I connected with Suzanne Jackson on 05/08/23 at  1:00 PM EDT by telephone and verified that I am speaking with the correct person using two identifiers.  Location: Patient: Suzanne Jackson Provider: Alyse Bach, RN   I discussed the limitations, risks, security and privacy concerns of performing an evaluation and management service by telephone and the availability of in person appointments. I also discussed with the patient that there may be a patient responsible charge related to this service. The patient expressed understanding and agreed to proceed.   Shared Decision Making Visit Lung Cancer Screening Program [phone])   Eligibility: Age 53 y.o. Pack Years Smoking History Calculation 54 (# packs/per year x # years smoked) Recent History of coughing up blood  no Unexplained weight loss? no ( >Than 15 pounds within the last 6 months ) Prior History Lung / other cancer yes (Diagnosis within the last 5 years already requiring surveillance chest CT Scans). Smoking Status Current Smoker Former Smokers: Years since quit: n/a  Quit Date: n/a  Visit Components: Discussion included one or more decision making aids. yes Discussion included risk/benefits of screening. yes Discussion included potential follow up diagnostic testing for abnormal scans. yes Discussion included meaning and risk of over diagnosis. yes Discussion included meaning and risk of False Positives. yes Discussion included meaning of total radiation exposure. yes  Counseling Included: Importance of  adherence to annual lung cancer LDCT screening. yes Impact of comorbidities on ability to participate in the program. yes Ability and willingness to under diagnostic treatment. yes  Smoking Cessation Counseling: Current Smokers:  Discussed importance of smoking cessation. yes Information about tobacco cessation classes and interventions provided to patient. yes Patient provided with "ticket" for LDCT Scan. no Symptomatic Patient. yes  Counseling(Intermediate counseling: > three minutes) 99406 Diagnosis Code: Tobacco Use Z72.0 Asymptomatic Patient yes  Counseling (Intermediate counseling: > three minutes counseling) B1478 Former Smokers:  Discussed the importance of maintaining cigarette abstinence. yes Diagnosis Code: Personal History of Nicotine  Dependence. G95.621 Information about tobacco cessation classes and interventions provided to patient. Yes Patient provided with "ticket" for LDCT Scan. no Written Order for Lung Cancer Screening with LDCT placed in Epic. Yes (CT Chest Lung Cancer Screening Low Dose W/O CM) [id number] Z12.2-Screening of respiratory organs Z87.891-Personal history of nicotine  dependence   Alyse Bach, RN

## 2023-05-08 NOTE — Patient Instructions (Signed)

## 2023-05-09 ENCOUNTER — Ambulatory Visit
Admission: RE | Admit: 2023-05-09 | Discharge: 2023-05-09 | Disposition: A | Discharge status: Home / Self Care | Source: Ambulatory Visit | Attending: Family Medicine | Admitting: Family Medicine

## 2023-05-09 DIAGNOSIS — Z87891 Personal history of nicotine dependence: Secondary | ICD-10-CM

## 2023-05-09 DIAGNOSIS — Z122 Encounter for screening for malignant neoplasm of respiratory organs: Secondary | ICD-10-CM

## 2023-05-09 DIAGNOSIS — F17210 Nicotine dependence, cigarettes, uncomplicated: Secondary | ICD-10-CM

## 2023-05-11 ENCOUNTER — Other Ambulatory Visit: Payer: Self-pay | Admitting: Family Medicine

## 2023-05-11 DIAGNOSIS — R9389 Abnormal findings on diagnostic imaging of other specified body structures: Secondary | ICD-10-CM

## 2023-05-16 ENCOUNTER — Ambulatory Visit
Admission: RE | Admit: 2023-05-16 | Discharge: 2023-05-16 | Disposition: A | Discharge status: Home / Self Care | Source: Ambulatory Visit | Attending: Family Medicine | Admitting: Family Medicine

## 2023-05-16 DIAGNOSIS — J439 Emphysema, unspecified: Secondary | ICD-10-CM | POA: Diagnosis not present

## 2023-05-16 DIAGNOSIS — I700 Atherosclerosis of aorta: Secondary | ICD-10-CM | POA: Diagnosis not present

## 2023-05-16 DIAGNOSIS — R9389 Abnormal findings on diagnostic imaging of other specified body structures: Secondary | ICD-10-CM

## 2023-05-16 MED ORDER — IOPAMIDOL (ISOVUE-300) INJECTION 61%
75.0000 mL | Freq: Once | INTRAVENOUS | Status: AC | PRN
  Administered 2023-05-16: 75 mL via INTRAVENOUS

## 2023-05-17 DIAGNOSIS — F1010 Alcohol abuse, uncomplicated: Secondary | ICD-10-CM | POA: Diagnosis not present

## 2023-05-17 DIAGNOSIS — J45909 Unspecified asthma, uncomplicated: Secondary | ICD-10-CM | POA: Diagnosis not present

## 2023-05-17 DIAGNOSIS — E876 Hypokalemia: Secondary | ICD-10-CM | POA: Diagnosis not present

## 2023-05-17 DIAGNOSIS — Z720 Tobacco use: Secondary | ICD-10-CM | POA: Diagnosis not present

## 2023-05-28 DIAGNOSIS — R9431 Abnormal electrocardiogram [ECG] [EKG]: Secondary | ICD-10-CM | POA: Diagnosis not present

## 2023-05-28 DIAGNOSIS — I10 Essential (primary) hypertension: Secondary | ICD-10-CM | POA: Diagnosis not present

## 2023-05-28 DIAGNOSIS — R0600 Dyspnea, unspecified: Secondary | ICD-10-CM | POA: Diagnosis not present

## 2023-05-28 DIAGNOSIS — I253 Aneurysm of heart: Secondary | ICD-10-CM | POA: Diagnosis not present

## 2023-05-28 DIAGNOSIS — I083 Combined rheumatic disorders of mitral, aortic and tricuspid valves: Secondary | ICD-10-CM | POA: Diagnosis not present

## 2023-06-04 ENCOUNTER — Other Ambulatory Visit: Payer: Self-pay | Admitting: Acute Care

## 2023-06-04 DIAGNOSIS — F17210 Nicotine dependence, cigarettes, uncomplicated: Secondary | ICD-10-CM

## 2023-06-04 DIAGNOSIS — C44729 Squamous cell carcinoma of skin of left lower limb, including hip: Secondary | ICD-10-CM | POA: Diagnosis not present

## 2023-06-04 DIAGNOSIS — Z87891 Personal history of nicotine dependence: Secondary | ICD-10-CM

## 2023-06-07 ENCOUNTER — Ambulatory Visit: Payer: Self-pay | Admitting: Cardiology

## 2023-06-22 DIAGNOSIS — I10 Essential (primary) hypertension: Secondary | ICD-10-CM | POA: Diagnosis not present

## 2023-06-22 DIAGNOSIS — I513 Intracardiac thrombosis, not elsewhere classified: Secondary | ICD-10-CM | POA: Diagnosis not present

## 2023-06-22 DIAGNOSIS — I25118 Atherosclerotic heart disease of native coronary artery with other forms of angina pectoris: Secondary | ICD-10-CM | POA: Diagnosis not present

## 2023-06-22 DIAGNOSIS — I429 Cardiomyopathy, unspecified: Secondary | ICD-10-CM | POA: Diagnosis not present

## 2023-06-22 DIAGNOSIS — I253 Aneurysm of heart: Secondary | ICD-10-CM | POA: Diagnosis not present

## 2023-06-26 DIAGNOSIS — F1010 Alcohol abuse, uncomplicated: Secondary | ICD-10-CM | POA: Diagnosis not present

## 2023-06-26 DIAGNOSIS — I10 Essential (primary) hypertension: Secondary | ICD-10-CM | POA: Diagnosis not present

## 2023-06-26 DIAGNOSIS — Z720 Tobacco use: Secondary | ICD-10-CM | POA: Diagnosis not present

## 2023-06-26 DIAGNOSIS — I729 Aneurysm of unspecified site: Secondary | ICD-10-CM | POA: Diagnosis not present

## 2023-06-26 DIAGNOSIS — Q453 Other congenital malformations of pancreas and pancreatic duct: Secondary | ICD-10-CM | POA: Diagnosis not present

## 2023-06-26 DIAGNOSIS — K625 Hemorrhage of anus and rectum: Secondary | ICD-10-CM | POA: Diagnosis not present

## 2023-07-02 DIAGNOSIS — I420 Dilated cardiomyopathy: Secondary | ICD-10-CM | POA: Diagnosis not present

## 2023-07-02 DIAGNOSIS — Z720 Tobacco use: Secondary | ICD-10-CM | POA: Diagnosis not present

## 2023-07-02 DIAGNOSIS — I509 Heart failure, unspecified: Secondary | ICD-10-CM | POA: Diagnosis not present

## 2023-07-02 DIAGNOSIS — I5022 Chronic systolic (congestive) heart failure: Secondary | ICD-10-CM | POA: Diagnosis not present

## 2023-07-02 DIAGNOSIS — I513 Intracardiac thrombosis, not elsewhere classified: Secondary | ICD-10-CM | POA: Diagnosis not present

## 2023-07-23 ENCOUNTER — Other Ambulatory Visit: Payer: Self-pay

## 2023-07-23 ENCOUNTER — Emergency Department (HOSPITAL_COMMUNITY)

## 2023-07-23 ENCOUNTER — Encounter (HOSPITAL_COMMUNITY): Payer: Self-pay

## 2023-07-23 ENCOUNTER — Emergency Department (HOSPITAL_COMMUNITY)
Admission: EM | Admit: 2023-07-23 | Discharge: 2023-07-24 | Disposition: A | Discharge status: Home / Self Care | Attending: Emergency Medicine | Admitting: Emergency Medicine

## 2023-07-23 DIAGNOSIS — J90 Pleural effusion, not elsewhere classified: Secondary | ICD-10-CM | POA: Diagnosis not present

## 2023-07-23 DIAGNOSIS — I509 Heart failure, unspecified: Secondary | ICD-10-CM | POA: Diagnosis not present

## 2023-07-23 DIAGNOSIS — R079 Chest pain, unspecified: Secondary | ICD-10-CM | POA: Diagnosis not present

## 2023-07-23 DIAGNOSIS — R0989 Other specified symptoms and signs involving the circulatory and respiratory systems: Secondary | ICD-10-CM | POA: Diagnosis not present

## 2023-07-23 DIAGNOSIS — F17200 Nicotine dependence, unspecified, uncomplicated: Secondary | ICD-10-CM | POA: Diagnosis not present

## 2023-07-23 DIAGNOSIS — Z7951 Long term (current) use of inhaled steroids: Secondary | ICD-10-CM | POA: Insufficient documentation

## 2023-07-23 DIAGNOSIS — Z79899 Other long term (current) drug therapy: Secondary | ICD-10-CM | POA: Diagnosis not present

## 2023-07-23 DIAGNOSIS — I110 Hypertensive heart disease with heart failure: Secondary | ICD-10-CM | POA: Diagnosis not present

## 2023-07-23 DIAGNOSIS — R0602 Shortness of breath: Secondary | ICD-10-CM | POA: Diagnosis not present

## 2023-07-23 DIAGNOSIS — J45909 Unspecified asthma, uncomplicated: Secondary | ICD-10-CM | POA: Insufficient documentation

## 2023-07-23 DIAGNOSIS — R509 Fever, unspecified: Secondary | ICD-10-CM | POA: Diagnosis not present

## 2023-07-23 DIAGNOSIS — R110 Nausea: Secondary | ICD-10-CM | POA: Diagnosis not present

## 2023-07-23 DIAGNOSIS — R0689 Other abnormalities of breathing: Secondary | ICD-10-CM | POA: Diagnosis not present

## 2023-07-23 LAB — CBC
HCT: 33.4 % — ABNORMAL LOW (ref 36.0–46.0)
Hemoglobin: 11.3 g/dL — ABNORMAL LOW (ref 12.0–15.0)
MCH: 32.8 pg (ref 26.0–34.0)
MCHC: 33.8 g/dL (ref 30.0–36.0)
MCV: 96.8 fL (ref 80.0–100.0)
Platelets: 231 K/uL (ref 150–400)
RBC: 3.45 MIL/uL — ABNORMAL LOW (ref 3.87–5.11)
RDW: 15.6 % — ABNORMAL HIGH (ref 11.5–15.5)
WBC: 15 K/uL — ABNORMAL HIGH (ref 4.0–10.5)
nRBC: 0 % (ref 0.0–0.2)

## 2023-07-23 NOTE — ED Triage Notes (Signed)
 Pt BIB EMS with c/o SOB that started today. Pt reports nausea, CP, and worsens with exertion.    12mg  albuterol  2mg  atrovent  Nitroglycerin  x1   100.4 temp HR 110 RR 30 130/68

## 2023-07-23 NOTE — ED Provider Triage Note (Signed)
 Emergency Medicine Provider Triage Evaluation Note  Suzanne Jackson , a 53 y.o. female  was evaluated in triage.  Pt complains of chest pain and shortness of breath.  Patient states symptoms began this morning.  She reports using 12 mg of albuterol  throughout the day.  The patient does have a history of a blood clot and states she has reduced ejection fraction in her left ventricle  Review of Systems  Positive:  Negative:   Physical Exam  BP 127/80 (BP Location: Left Arm)   Pulse (!) 108   Temp 98.7 F (37.1 C)   Resp 18   Wt 53.5 kg   SpO2 94%   BMI 22.30 kg/m  Gen:   Awake, no distress   Resp:  Normal effort  MSK:   Moves extremities without difficulty  Other:    Medical Decision Making  Medically screening exam initiated at 11:40 PM.  Appropriate orders placed.  Suzanne Jackson was informed that the remainder of the evaluation will be completed by another provider, this initial triage assessment does not replace that evaluation, and the importance of remaining in the ED until their evaluation is complete.     Logan Ubaldo NOVAK, [place] 07/23/23 2341

## 2023-07-24 LAB — BASIC METABOLIC PANEL WITH GFR
Anion gap: 15 (ref 5–15)
BUN: 12 mg/dL (ref 6–20)
CO2: 19 mmol/L — ABNORMAL LOW (ref 22–32)
Calcium: 9 mg/dL (ref 8.9–10.3)
Chloride: 101 mmol/L (ref 98–111)
Creatinine, Ser: 0.61 mg/dL (ref 0.44–1.00)
GFR, Estimated: >60 mL/min (ref >60)
Glucose, Bld: 146 mg/dL — ABNORMAL HIGH (ref 70–99)
Potassium: 3.1 mmol/L — ABNORMAL LOW (ref 3.5–5.1)
Sodium: 135 mmol/L (ref 135–145)

## 2023-07-24 LAB — TROPONIN I (HIGH SENSITIVITY)
Troponin I (High Sensitivity): 28 ng/L — ABNORMAL HIGH (ref <18)
Troponin I (High Sensitivity): 27 ng/L — ABNORMAL HIGH (ref <18)

## 2023-07-24 LAB — BRAIN NATRIURETIC PEPTIDE: B Natriuretic Peptide: 2032.5 pg/mL — ABNORMAL HIGH (ref 0.0–100.0)

## 2023-07-24 MED ORDER — FUROSEMIDE 20 MG PO TABS: 20.0000 mg | ORAL_TABLET | Freq: Every day | ORAL | 0 refills | Status: DC

## 2023-07-24 MED ORDER — FUROSEMIDE 10 MG/ML IJ SOLN
40.0000 mg | Freq: Once | INTRAMUSCULAR | Status: AC
  Administered 2023-07-24: 40 mg via INTRAVENOUS
  Filled 2023-07-24: qty 4

## 2023-07-24 NOTE — Discharge Instructions (Addendum)
 Labs and imaging are consistent with congestive heart failure. I am going to have you take a fluid pill to help with shortness of breath.   Please call cardiology office to set up appointment. Please take Lasix  and then have labs rechecked in 5 days. Return to ER with new or worsening symptoms.

## 2023-07-24 NOTE — ED Notes (Signed)
 Walked PT with Pulse Ox PT O2 sat stayed at 93-95%, her HR went to 119 other wise PT had no other complaints. States she gt a little SOB. But other wise was fine and VSS

## 2023-07-24 NOTE — ED Provider Notes (Signed)
 [place] EMERGENCY DEPARTMENT AT [facility] Provider Note   CSN: [id number] Arrival date & time: 07/23/23  2310     Patient presents with: Shortness of Breath   Suzanne Jackson is a 53 y.o. female.  With past medical history of asthma, hypertension, alcohol  dependence, tobacco dependence, congestive heart failure with EF of 25% presents to emergency room with complaint of chest pain shortness of breath and nausea.  Notes this is worse lying down flat and with exertion.  She has noted some improvement in her shortness of breath after receiving nitro and breathing treatment.  Notes her chest pain is no longer there and seems to be left shoulder and arm.  She notes some cough, not productive.  Denies any fever.    Shortness of Breath      Prior to Admission medications   Medication Sig Start Date End Date Taking? Authorizing Provider  amLODipine  (NORVASC ) 10 MG tablet Take 10 mg by mouth daily.  10/11/16   [provider]  budesonide-formoterol  (SYMBICORT) 160-4.5 MCG/ACT inhaler Inhale 1 puff into the lungs 2 (two) times daily.    [provider]  Disulfiram  500 MG TABS Take 1 tablet (500 mg total) by mouth daily. 06/18/20   Vann, Jessica U, DO  DULoxetine  (CYMBALTA ) 60 MG capsule Take 60 mg by mouth daily. 09/18/19   [provider]  fexofenadine (ALLEGRA) 180 MG tablet Take 180 mg by mouth daily.    [provider]  hydrochlorothiazide  (MICROZIDE ) 12.5 MG capsule Take 12.5 mg by mouth daily. 10/11/16   [provider]  hydrOXYzine  (ATARAX /VISTARIL ) 50 MG tablet Take 1 tablet (50 mg total) by mouth every 6 (six) hours as needed (refractory itching). 08/31/17   McKenzie, Kayla J, PA-C  montelukast  (SINGULAIR ) 10 MG tablet Take 10 mg by mouth every morning.  11/21/12   [provider]  mupirocin  ointment (BACTROBAN ) 2 % Apply 1 Application topically 2 (two) times daily. 01/18/22   McDonald, Juliene SAUNDERS, DPM  potassium chloride  SA  (K-DUR,KLOR-CON ) 20 MEQ tablet Take 20 mEq by mouth daily.    [provider]    Allergies: Aspirin, Ibuprofen, Percocet [oxycodone -acetaminophen ], Aleve [naproxen], and Bupropion    Review of Systems  Respiratory:  Positive for shortness of breath.     Updated Vital Signs BP 123/76   Pulse 95   Temp 97.9 F (36.6 C)   Resp (!) 26   Wt 53.5 kg   SpO2 93%   BMI 22.30 kg/m   Physical Exam Vitals and nursing note reviewed.  Constitutional:      General: She is not in acute distress.    Appearance: She is not toxic-appearing.  HENT:     Head: Normocephalic and atraumatic.  Eyes:     General: No scleral icterus.    Conjunctiva/sclera: Conjunctivae normal.  Cardiovascular:     Rate and Rhythm: Normal rate and regular rhythm.     Pulses: Normal pulses.     Heart sounds: Normal heart sounds.  Pulmonary:     Effort: Pulmonary effort is normal. No respiratory distress.     Breath sounds: Rales present. No wheezing.  Abdominal:     General: Abdomen is flat. Bowel sounds are normal.     Palpations: Abdomen is soft.     Tenderness: There is no abdominal tenderness.  Musculoskeletal:     Right lower leg: No edema.     Left lower leg: No edema.  Skin:    General: Skin is warm  and dry.     Findings: No lesion.  Neurological:     General: No focal deficit present.     Mental Status: She is alert and oriented to person, place, and time. Mental status is at baseline.     (all labs ordered are listed, but only abnormal results are displayed) Labs Reviewed  BASIC METABOLIC PANEL WITH GFR - Abnormal; Notable for the following components:      Result Value   Potassium 3.1 (*)    CO2 19 (*)    Glucose, Bld 146 (*)    All other components within normal limits  CBC - Abnormal; Notable for the following components:   WBC 15.0 (*)    RBC 3.45 (*)    Hemoglobin 11.3 (*)    HCT 33.4 (*)    RDW 15.6 (*)    All other components within normal limits  BRAIN NATRIURETIC  PEPTIDE - Abnormal; Notable for the following components:   B Natriuretic Peptide 2,032.5 (*)    All other components within normal limits  TROPONIN I (HIGH SENSITIVITY) - Abnormal; Notable for the following components:   Troponin I (High Sensitivity) 28 (*)    All other components within normal limits  TROPONIN I (HIGH SENSITIVITY) - Abnormal; Notable for the following components:   Troponin I (High Sensitivity) 27 (*)    All other components within normal limits    EKG: None  Radiology: DG Chest Portable 1 View Result Date: 07/23/2023 CLINICAL DATA:  Chest pain and shortness of breath EXAM: PORTABLE CHEST 1 VIEW COMPARISON:  05/07/2023 FINDINGS: Cardiac enlargement with mild pulmonary vascular congestion. Small bilateral pleural effusions. No pneumothorax. Mediastinal contours appear intact. No focal consolidation. IMPRESSION: Cardiac enlargement with mild vascular congestion and small bilateral pleural effusions. No definite edema or consolidation. Electronically Signed   By: Elsie Gravely M.D.   On: 07/23/2023 23:33     Procedures   Medications Ordered in the ED  furosemide  (LASIX ) injection 40 mg (40 mg Intravenous Given 07/24/23 0408)                                    Medical Decision Making Amount and/or Complexity of Data Reviewed Labs: ordered. Radiology: ordered.  Risk Prescription drug management.   This patient presents to the ED for concern of SOB, this involves an extensive number of treatment options, and is a complaint that carries with it a high risk of complications and morbidity.  The differential diagnosis includes PE, ACS, CHF, COPD exacerbation, asthma exacerbation, aortic dissection, pneumonia, pneumothorax   Co morbidities that complicate the patient evaluation  CHF COPD   Additional history obtained:  Additional history obtained from Echo 25-30% EF,    Lab Tests:  I personally interpreted labs.  The pertinent results include:   CBC with  mild leukocytosis.  BNP elevated at 2000.  Troponins flat at 27. No AKI.   Imaging Studies ordered:  I ordered imaging studies including chest x-ray   I independently visualized and interpreted imaging which showed mild vascular congestion, cardiomegaly.  I agree with the radiologist interpretation   Cardiac Monitoring: / EKG:  The patient was maintained on a cardiac monitor.  I personally viewed and interpreted the cardiac monitored which showed an underlying rhythm of: sinus tachycardia, LVH    Problem List / ED Course / Critical interventions / Medication management  Patient presenting with complaint of shortness of breath associated  with central chest tightness. Chest pain has gone away and now in left arm, worse with ROM. Has had episodes of SOB/CP in past, worse with exertion. Has COPD, but reports this feels different. Her chest xray show congestion concerning for CHF. She does not have BLE. Her last echo showed low EF, she does not take Lasix  at home. Patient in no acute distress, no wheezing, normal O2 sat. Troponin flat. No arrhythmia. No significant anemia or electrolyte abnormality. Will give Lasix  for congestion on x-ray, ambulate and reassess.  Discussed labs and imaging. Patient is feeling better after lasix . Ambulated without desat. She had good follow up. She is not on a diuretic at home. Will start and have her follow back up with cardiology and PCP. Given strict return precautions.   I ordered medication including Lasix  Reevaluation of the patient after these medicines showed that the patient improved I have reviewed the patients home medicines and have made adjustments as needed   Plan  F/u w/ PCP in 2-3d to ensure resolution of sx.  Patient was given return precautions. Patient stable for discharge at this time.  Patient educated on sx/dx and verbalized understanding of plan. Return to ER w/ new or worsening sx.       Final diagnoses:  Shortness of breath   Acute on chronic congestive heart failure, unspecified heart failure type [facility])    ED Discharge Orders          Ordered    furosemide  (LASIX ) 20 MG tablet  Daily        07/24/23 0535               Carron Mcmurry, Warren SAILOR, PA-C 07/24/23 0547    Mesner, Selinda, MD 07/24/23 [phone]

## 2023-07-30 DIAGNOSIS — I253 Aneurysm of heart: Secondary | ICD-10-CM | POA: Diagnosis not present

## 2023-07-30 DIAGNOSIS — I509 Heart failure, unspecified: Secondary | ICD-10-CM | POA: Diagnosis not present

## 2023-07-30 DIAGNOSIS — I10 Essential (primary) hypertension: Secondary | ICD-10-CM | POA: Diagnosis not present

## 2023-08-09 DIAGNOSIS — I420 Dilated cardiomyopathy: Secondary | ICD-10-CM | POA: Diagnosis not present

## 2023-08-09 DIAGNOSIS — I10 Essential (primary) hypertension: Secondary | ICD-10-CM | POA: Diagnosis not present

## 2023-08-09 DIAGNOSIS — Z1330 Encounter for screening examination for mental health and behavioral disorders, unspecified: Secondary | ICD-10-CM | POA: Diagnosis not present

## 2023-08-09 DIAGNOSIS — I426 Alcoholic cardiomyopathy: Secondary | ICD-10-CM | POA: Diagnosis not present

## 2023-08-09 DIAGNOSIS — Z720 Tobacco use: Secondary | ICD-10-CM | POA: Diagnosis not present

## 2023-08-09 DIAGNOSIS — I513 Intracardiac thrombosis, not elsewhere classified: Secondary | ICD-10-CM | POA: Diagnosis not present

## 2023-08-21 DIAGNOSIS — Q453 Other congenital malformations of pancreas and pancreatic duct: Secondary | ICD-10-CM | POA: Diagnosis not present

## 2023-09-09 ENCOUNTER — Emergency Department (HOSPITAL_COMMUNITY): Payer: Self-pay

## 2023-09-09 ENCOUNTER — Encounter (HOSPITAL_COMMUNITY): Payer: Self-pay | Admitting: Emergency Medicine

## 2023-09-09 ENCOUNTER — Emergency Department (HOSPITAL_COMMUNITY)
Admission: EM | Admit: 2023-09-09 | Discharge: 2023-09-09 | Disposition: A | Discharge status: Home / Self Care | Payer: Self-pay | Attending: Student | Admitting: Student

## 2023-09-09 ENCOUNTER — Other Ambulatory Visit: Payer: Self-pay

## 2023-09-09 DIAGNOSIS — F10129 Alcohol abuse with intoxication, unspecified: Secondary | ICD-10-CM | POA: Diagnosis not present

## 2023-09-09 DIAGNOSIS — I10 Essential (primary) hypertension: Secondary | ICD-10-CM | POA: Diagnosis not present

## 2023-09-09 DIAGNOSIS — E876 Hypokalemia: Secondary | ICD-10-CM | POA: Insufficient documentation

## 2023-09-09 DIAGNOSIS — Y908 Blood alcohol level of 240 mg/100 ml or more: Secondary | ICD-10-CM | POA: Diagnosis not present

## 2023-09-09 DIAGNOSIS — Z7901 Long term (current) use of anticoagulants: Secondary | ICD-10-CM | POA: Insufficient documentation

## 2023-09-09 DIAGNOSIS — R404 Transient alteration of awareness: Secondary | ICD-10-CM | POA: Diagnosis not present

## 2023-09-09 DIAGNOSIS — F10929 Alcohol use, unspecified with intoxication, unspecified: Secondary | ICD-10-CM | POA: Diagnosis not present

## 2023-09-09 DIAGNOSIS — R4020 Unspecified coma: Secondary | ICD-10-CM | POA: Diagnosis not present

## 2023-09-09 DIAGNOSIS — I951 Orthostatic hypotension: Secondary | ICD-10-CM | POA: Diagnosis not present

## 2023-09-09 DIAGNOSIS — F10220 Alcohol dependence with intoxication, uncomplicated: Secondary | ICD-10-CM | POA: Insufficient documentation

## 2023-09-09 DIAGNOSIS — R55 Syncope and collapse: Secondary | ICD-10-CM | POA: Diagnosis not present

## 2023-09-09 DIAGNOSIS — Z79899 Other long term (current) drug therapy: Secondary | ICD-10-CM | POA: Insufficient documentation

## 2023-09-09 DIAGNOSIS — F17210 Nicotine dependence, cigarettes, uncomplicated: Secondary | ICD-10-CM | POA: Insufficient documentation

## 2023-09-09 DIAGNOSIS — G9389 Other specified disorders of brain: Secondary | ICD-10-CM | POA: Diagnosis not present

## 2023-09-09 DIAGNOSIS — F10920 Alcohol use, unspecified with intoxication, uncomplicated: Secondary | ICD-10-CM

## 2023-09-09 LAB — COMPREHENSIVE METABOLIC PANEL WITH GFR
ALT: 14 U/L (ref 0–44)
AST: 36 U/L (ref 15–41)
Albumin: 3.4 g/dL — ABNORMAL LOW (ref 3.5–5.0)
Alkaline Phosphatase: 76 U/L (ref 38–126)
Anion gap: 19 — ABNORMAL HIGH (ref 5–15)
BUN: 22 mg/dL — ABNORMAL HIGH (ref 6–20)
CO2: 24 mmol/L (ref 22–32)
Calcium: 8.6 mg/dL — ABNORMAL LOW (ref 8.9–10.3)
Chloride: 96 mmol/L — ABNORMAL LOW (ref 98–111)
Creatinine, Ser: 0.65 mg/dL (ref 0.44–1.00)
GFR, Estimated: >60 mL/min (ref >60)
Glucose, Bld: 105 mg/dL — ABNORMAL HIGH (ref 70–99)
Potassium: 2.8 mmol/L — ABNORMAL LOW (ref 3.5–5.1)
Sodium: 139 mmol/L (ref 135–145)
Total Bilirubin: 0.5 mg/dL (ref 0.0–1.2)
Total Protein: 6.8 g/dL (ref 6.5–8.1)

## 2023-09-09 LAB — CBC WITH DIFFERENTIAL/PLATELET
Abs Immature Granulocytes: 0.02 K/uL (ref 0.00–0.07)
Basophils Absolute: 0.1 K/uL (ref 0.0–0.1)
Basophils Relative: 1 %
Eosinophils Absolute: 0.2 K/uL (ref 0.0–0.5)
Eosinophils Relative: 2 %
HCT: 31.1 % — ABNORMAL LOW (ref 36.0–46.0)
Hemoglobin: 10.6 g/dL — ABNORMAL LOW (ref 12.0–15.0)
Immature Granulocytes: 0 %
Lymphocytes Relative: 12 %
Lymphs Abs: 1.1 K/uL (ref 0.7–4.0)
MCH: 33.2 pg (ref 26.0–34.0)
MCHC: 34.1 g/dL (ref 30.0–36.0)
MCV: 97.5 fL (ref 80.0–100.0)
Monocytes Absolute: 0.5 K/uL (ref 0.1–1.0)
Monocytes Relative: 5 %
Neutro Abs: 7.7 K/uL (ref 1.7–7.7)
Neutrophils Relative %: 80 %
Platelets: 224 K/uL (ref 150–400)
RBC: 3.19 MIL/uL — ABNORMAL LOW (ref 3.87–5.11)
RDW: 16.3 % — ABNORMAL HIGH (ref 11.5–15.5)
WBC: 9.6 K/uL (ref 4.0–10.5)
nRBC: 0 % (ref 0.0–0.2)

## 2023-09-09 LAB — PROTIME-INR
INR: 1 (ref 0.8–1.2)
Prothrombin Time: 13.3 s (ref 11.4–15.2)

## 2023-09-09 LAB — AMMONIA: Ammonia: 19 umol/L (ref 9–35)

## 2023-09-09 LAB — CBG MONITORING, ED: Glucose-Capillary: 125 mg/dL — ABNORMAL HIGH (ref 70–99)

## 2023-09-09 LAB — TROPONIN I (HIGH SENSITIVITY): Troponin I (High Sensitivity): 13 ng/L (ref <18)

## 2023-09-09 LAB — ETHANOL: Alcohol, Ethyl (B): 347 mg/dL (ref <15)

## 2023-09-09 MED ORDER — LACTATED RINGERS IV BOLUS
1000.0000 mL | Freq: Once | INTRAVENOUS | Status: AC
  Administered 2023-09-09: 1000 mL via INTRAVENOUS

## 2023-09-09 MED ORDER — MAGNESIUM OXIDE -MG SUPPLEMENT 400 (240 MG) MG PO TABS
800.0000 mg | ORAL_TABLET | Freq: Once | ORAL | Status: AC
  Administered 2023-09-09: 800 mg via ORAL
  Filled 2023-09-09: qty 2

## 2023-09-09 MED ORDER — POTASSIUM CHLORIDE CRYS ER 20 MEQ PO TBCR: 20.0000 meq | EXTENDED_RELEASE_TABLET | Freq: Two times a day (BID) | ORAL | 0 refills | Status: DC

## 2023-09-09 MED ORDER — POTASSIUM CHLORIDE 20 MEQ PO PACK
40.0000 meq | PACK | Freq: Once | ORAL | Status: AC
  Administered 2023-09-09: 40 meq via ORAL
  Filled 2023-09-09: qty 2

## 2023-09-09 MED ORDER — POTASSIUM CHLORIDE CRYS ER 20 MEQ PO TBCR
40.0000 meq | EXTENDED_RELEASE_TABLET | Freq: Once | ORAL | Status: AC
  Administered 2023-09-09: 40 meq via ORAL
  Filled 2023-09-09: qty 2

## 2023-09-09 NOTE — ED Notes (Signed)
 Pt given water on request.  Sitting up speaking with family at bedside.

## 2023-09-09 NOTE — ED Provider Notes (Signed)
 [place] EMERGENCY DEPARTMENT AT [facility] Provider Note  CSN: [id number] Arrival date & time: 09/09/23 1929  Chief Complaint(s) Unresponsive  HPI Suzanne Jackson is a 53 y.o. female with PMH alcohol  abuse with alcohol  dependence, marijuana abuse, pancreatitis who presents emerged part for evaluation of a syncopal episode.  The patient states that she drinks daily and last drink was earlier today.  States that she was reaching down to pet the dog's and fell to the ground.  Husband found her unresponsive in the bed.  EMS used an ammonia smelling salts and patient did awaken in the field.  There are empty bottles of hydrocodone  and Valium  on the scene.  Patient arrives alert and answering questions with some periorbital bruising on the left.  She states that this is from an old injury.  Arrives with softer blood pressures but denies chest pain, shortness of breath, abdominal pain, nausea, vomiting or other systemic symptoms.  Past Medical History Past Medical History:  Diagnosis Date   Alcohol  dependence with uncomplicated withdrawal (HCC) 06/15/2020   Alcoholic pancreatitis    Allergies    Arthritis    BACK   Asthma    Depression    Frequency of urination    History of Clostridium difficile colitis 07/10/2022   History of kidney stones    Hypertension    Leg pain    Left   Marijuana abuse 06/15/2020   Microhematuria    Right ureteral stone    Tobacco dependence 06/15/2020   Wears contact lenses    Patient Active Problem List   Diagnosis Date Noted   History of Clostridium difficile colitis 07/10/2022   Alcohol  use disorder 06/21/2022   Exposed orthopaedic hardware (HCC) 06/21/2022   Pyogenic inflammation of bone (HCC) 06/21/2022   Open wound of right foot 06/10/2022   Acute pancreatitis 06/16/2020   Hypokalemia 06/15/2020   Alcohol  dependence with uncomplicated withdrawal (HCC) 06/15/2020   Tobacco dependence 06/15/2020   Marijuana abuse 06/15/2020    Alcoholic pancreatitis    History of kidney stones    Hypertension    Allergies    Depression    Radiculopathy 08/29/2017   Home Medication(s) Prior to Admission medications   Medication Sig Start Date End Date Taking? Authorizing Provider  potassium chloride  SA (KLOR-CON  M) 20 MEQ tablet Take 1 tablet (20 mEq total) by mouth 2 (two) times daily for 5 days. 09/09/23 09/14/23 Yes Circe Chilton, MD  albuterol  (PROVENTIL ) (2.5 MG/3ML) 0.083% nebulizer solution Take 3 mLs by nebulization in the morning, at noon, in the evening, and at bedtime. 05/14/23   [provider]  albuterol  (VENTOLIN  HFA) 108 (90 Base) MCG/ACT inhaler Inhale 1 puff into the lungs every 4 (four) hours as needed for shortness of breath. 03/15/23   [provider]  amLODipine  (NORVASC ) 10 MG tablet Take 10 mg by mouth daily.  10/11/16   [provider]  budesonide-formoterol  (SYMBICORT) 160-4.5 MCG/ACT inhaler Inhale 1 puff into the lungs 2 (two) times daily.    [provider]  chlordiazePOXIDE (LIBRIUM) 25 MG capsule Take 25 mg by mouth 4 (four) times daily as needed for anxiety or withdrawal. 05/18/23   [provider]  clopidogrel (PLAVIX) 75 MG tablet Take 75 mg by mouth daily. 06/28/23   [provider]  Disulfiram  500 MG TABS Take 1 tablet (500 mg total) by mouth daily. 06/18/20   Vann, Jessica U, DO  DULoxetine  (CYMBALTA ) 60 MG capsule Take 60 mg by mouth daily. 09/18/19  [provider]  ELIQUIS 5 MG TABS tablet Take 5 mg by mouth 2 (two) times daily.    [provider]  FARXIGA 10 MG TABS tablet Take 10 mg by mouth daily.    [provider]  fexofenadine (ALLEGRA) 180 MG tablet Take 180 mg by mouth daily.    [provider]  furosemide  (LASIX ) 20 MG tablet Take 1 tablet (20 mg total) by mouth daily. 07/24/23   Barrett, Warren SAILOR, PA-C  hydrochlorothiazide  (MICROZIDE ) 12.5 MG capsule Take 12.5 mg by mouth daily. 10/11/16   [provider]  HYDROcodone -acetaminophen  (NORCO) 7.5-325 MG tablet Take 1 tablet by mouth every 4 (four) hours as needed. 09/04/23   [provider]  hydrOXYzine  (ATARAX /VISTARIL ) 50 MG tablet Take 1 tablet (50 mg total) by mouth every 6 (six) hours as needed (refractory itching). 08/31/17   McKenzie, Kayla J, PA-C  LORazepam  (ATIVAN ) 1 MG tablet Take 1 mg by mouth 2 (two) times daily as needed for anxiety. 07/30/23   [provider]  losartan (COZAAR) 25 MG tablet Take 25 mg by mouth daily. 08/09/23   [provider]  metoprolol succinate (TOPROL-XL) 50 MG 24 hr tablet Take 50 mg by mouth daily.    [provider]  montelukast  (SINGULAIR ) 10 MG tablet Take 10 mg by mouth every morning.  11/21/12   [provider]  mupirocin  ointment (BACTROBAN ) 2 % Apply 1 Application topically 2 (two) times daily. 01/18/22   Silva Juliene SAUNDERS, DPM  Potassium Chloride  ER 20 MEQ TBCR Take 1 tablet by mouth in the morning and at bedtime.    [provider]  potassium chloride  SA (K-DUR,KLOR-CON ) 20 MEQ tablet Take 20 mEq by mouth daily.    [provider]  rosuvastatin (CRESTOR) 5 MG tablet Take 5 mg by mouth at bedtime. 05/28/23   [provider]  TRELEGY ELLIPTA 100-62.5-25 MCG/ACT AEPB Take 1 puff by mouth daily.    [provider]                                                                                                                                    Past Surgical History Past Surgical History:  Procedure Laterality Date   ANTERIOR LAT LUMBAR FUSION N/A 08/29/2017   Procedure: LUMBAR FOUR-FIVE LATERAL INTERBODY FUSION WITH INSTRUMENTATION AND ALLOGRAFT;  Surgeon: Beuford Anes, MD;  Location: [facility];  Service: Orthopedics;  Laterality: N/A;   APPLICATION OF WOUND VAC Right 06/09/2022   Procedure: APPLICATION OF WOUND VAC;  Surgeon: Silva Juliene SAUNDERS, DPM;  Location: [facility];  Service: Podiatry;  Laterality: Right;   CYSTO/   URETEROSCOPIC STONE EXTRACTION  1998   CYSTOSCOPY WITH RETROGRADE PYELOGRAM, URETEROSCOPY AND STENT PLACEMENT Right 02/12/2015   Procedure: CYSTOSCOPY WITH RETROGRADE PYELOGRAM, URETEROSCOPY;  Surgeon: Arlena Gal, MD;  Location: [facility];  Service: Urology;  Laterality: Right;   DILITATION & CURRETTAGE/HYSTROSCOPY WITH NOVASURE  ABLATION N/A 12/05/2017   Procedure: DILATATION & CURETTAGE/HYSTEROSCOPY WITH NOVASURE ABLATION;  Surgeon: Ozan, Jennifer, DO;  Location: [facility];  Service: Gynecology;  Laterality: N/A;   EXTRACORPOREAL SHOCK WAVE LITHOTRIPSY Right 08/08/2018   Procedure: EXTRACORPOREAL SHOCK WAVE LITHOTRIPSY (ESWL);  Surgeon: Renda Glance, MD;  Location: [facility];  Service: Urology;  Laterality: Right;   GRAFT APPLICATION Right 06/09/2022   Procedure: GRAFT APPLICATION;  Surgeon: Silva Juliene SAUNDERS, DPM;  Location: [facility];  Service: Podiatry;  Laterality: Right;   LAPAROSCOPIC CHOLECYSTECTOMY  1994   LAPAROSCOPIC TUBAL LIGATION Bilateral 05-06-2009   fulgeration   POSTERIOR FUSION LUMBAR SPINE  10-28-2008   L5 - S1   STONE EXTRACTION WITH BASKET Right 02/12/2015   Procedure: STONE EXTRACTION WITH BASKET AND LASER LITHOTRIPSY;  Surgeon: Arlena Gal, MD;  Location: [facility];  Service: Urology;  Laterality: Right;   TUBAL LIGATION     WOUND DEBRIDEMENT Right 06/09/2022   Procedure: DEBRIDEMENT WOUND;  Surgeon: Silva Juliene SAUNDERS, DPM;  Location: [facility];  Service: Podiatry;  Laterality: Right;   Family History Family History  Problem Relation Age of Onset   Pancreatitis Neg Hx     Social History Social History   Tobacco Use   Smoking status: Every Day    Current packs/day: 1.50    Average packs/day: 1.5 packs/day for 38.6 years (58.0 ttl pk-yrs)    Types: Cigarettes, E-cigarettes    Start date: 1987   Smokeless tobacco: Never   Tobacco comments:    Vape daily - DJM 08/17/2022  Vaping Use   Vaping status: Never  Used  Substance Use Topics   Alcohol  use: Yes    Alcohol /week: 4.0 - 5.0 standard drinks of alcohol     Types: 4 - 5 Shots of liquor per week    Comment: binge drinking; recently drinking 5-7 days a week, heavily; + tremors with withdrawal   Drug use: Not Currently    Types: Marijuana    Comment: regular use, last dose more than 1 month from 05/2022   Allergies Aspirin, Ibuprofen, Oxycodone -acetaminophen , Aleve [naproxen], and Bupropion  Review of Systems Review of Systems  Neurological:  Positive for syncope.    Physical Exam Vital Signs  I have reviewed the triage vital signs BP 111/63   Pulse 77   Temp 98.6 F (37 C) (Oral)   Resp 16   SpO2 97%   Physical Exam Vitals and nursing note reviewed.  Constitutional:      General: She is not in acute distress.    Appearance: She is well-developed.  HENT:     Head: Normocephalic.     Comments: Old left-sided periorbital bruising Eyes:     Conjunctiva/sclera: Conjunctivae normal.  Cardiovascular:     Rate and Rhythm: Normal rate and regular rhythm.     Heart sounds: No murmur heard. Pulmonary:     Effort: Pulmonary effort is normal. No respiratory distress.     Breath sounds: Normal breath sounds.  Abdominal:     Palpations: Abdomen is soft.     Tenderness: There is no abdominal tenderness.  Musculoskeletal:        General: No swelling.     Cervical back: Neck supple.  Skin:    General: Skin is warm and dry.     Capillary Refill: Capillary refill takes less than 2 seconds.  Neurological:     Mental Status: She is alert.  Psychiatric:        Mood and Affect: Mood normal.  ED Results and Treatments Labs (all labs ordered are listed, but only abnormal results are displayed) Labs Reviewed  COMPREHENSIVE METABOLIC PANEL WITH GFR - Abnormal; Notable for the following components:      Result Value   Potassium 2.8 (*)    Chloride 96 (*)    Glucose, Bld 105 (*)    BUN 22 (*)    Calcium 8.6 (*)    Albumin 3.4  (*)    Anion gap 19 (*)    All other components within normal limits  CBC WITH DIFFERENTIAL/PLATELET - Abnormal; Notable for the following components:   RBC 3.19 (*)    Hemoglobin 10.6 (*)    HCT 31.1 (*)    RDW 16.3 (*)    All other components within normal limits  ETHANOL - Abnormal; Notable for the following components:   Alcohol , Ethyl (B) 347 (*)    All other components within normal limits  CBG MONITORING, ED - Abnormal; Notable for the following components:   Glucose-Capillary 125 (*)    All other components within normal limits  AMMONIA  PROTIME-INR  RAPID URINE DRUG SCREEN, HOSP PERFORMED  TROPONIN I (HIGH SENSITIVITY)                                                                                                                          Radiology CT Head Wo Contrast Result Date: 09/09/2023 CLINICAL DATA:  Altered mental status.  Unresponsive patient. EXAM: CT HEAD WITHOUT CONTRAST TECHNIQUE: Contiguous axial images were obtained from the base of the skull through the vertex without intravenous contrast. RADIATION DOSE REDUCTION: This exam was performed according to the departmental dose-optimization program which includes automated exposure control, adjustment of the mA and/or kV according to patient size and/or use of iterative reconstruction technique. COMPARISON:  October 08, 2018 FINDINGS: Brain: There is generalized cerebral atrophy with widening of the extra-axial spaces and ventricular dilatation. There are areas of decreased attenuation within the white matter tracts of the supratentorial brain, consistent with microvascular disease changes. Vascular: No hyperdense vessel or unexpected calcification. Skull: Normal. Negative for fracture or focal lesion. Sinuses/Orbits: Moderate severity bilateral maxillary sinus mucosal thickening is seen. Marked severity bilateral ethmoid sinus, frontal sinus and sphenoid sinus mucosal thickening is also noted. Postoperative changes are  suspected along the medial walls of the bilateral maxillary sinuses. Other: None. IMPRESSION: 1. Generalized cerebral atrophy and microvascular disease changes of the supratentorial brain. 2. No acute intracranial abnormality. 3. Marked severity paranasal disease. Electronically Signed   By: Suzen Dials M.D.   On: 09/09/2023 20:56    Pertinent labs & imaging results that were available during my care of the patient were reviewed by me and considered in my medical decision making (see MDM for details).  Medications Ordered in ED Medications  lactated ringers  bolus 1,000 mL (0 mLs Intravenous Stopped 09/09/23 2134)  potassium chloride  SA (KLOR-CON  M) CR tablet 40 mEq (40 mEq Oral Given 09/09/23 2132)  potassium chloride  (KLOR-CON ) packet  40 mEq (40 mEq Oral Given 09/09/23 2132)  magnesium  oxide (MAG-OX) tablet 800 mg (800 mg Oral Given 09/09/23 2132)                                                                                                                                     Procedures Procedures  (including critical care time)  Medical Decision Making / ED Course   This patient presents to the ED for concern of syncope, this involves an extensive number of treatment options, and is a complaint that carries with it a high risk of complications and morbidity.  The differential diagnosis includes orthostatic syncope, cardiogenic syncope, vasovagal syncope, electrolyte abnormality, dehydration, dysrhythmia, vasovagal, Hypoglycemia, Seizure, Autonomic Insufficiency  MDM: Patient seen emerged part for evaluation of syncope.  Physical exam with what appears to be healing left-sided periorbital bruising but is otherwise unremarkable.  Laboratory evaluation with a potassium of 2.8, albumin 3.4, hemoglobin 10.6 which is baseline for this patient.  Alcohol  elevated at 347.  Cardiac monitoring with no evidence of dysrhythmia.  Potassium aggressively repeated with oral potassium and magnesium .  CT  head obtained that was reassuringly negative.  Patient fluid resuscitated on reevaluation patient looks and is feeling improved.  Orthostatic vital signs are normal.  I had a extended discussion with the patient about her alcohol  use and she states that she would be open to seeking treatment.  I provided resources for [facility].  Suspect today's presentation is secondary to decreased caloric intake and dehydration from excessive alcohol  use leading to orthostatic hypotension and syncope today.  Low suspicion for cardiogenic syncope especially in the setting of a normal high-sensitivity troponin.  At this time she does not meet inpatient criteria for admission and will be discharged outpatient follow-up.  Return precautions given which she and her family voiced understanding.   Additional history obtained: -Additional history obtained from multiple family numbers -External records from outside source obtained and reviewed including: Chart review including previous notes, labs, imaging, consultation notes   Lab Tests: -I ordered, reviewed, and interpreted labs.   The pertinent results include:   Labs Reviewed  COMPREHENSIVE METABOLIC PANEL WITH GFR - Abnormal; Notable for the following components:      Result Value   Potassium 2.8 (*)    Chloride 96 (*)    Glucose, Bld 105 (*)    BUN 22 (*)    Calcium 8.6 (*)    Albumin 3.4 (*)    Anion gap 19 (*)    All other components within normal limits  CBC WITH DIFFERENTIAL/PLATELET - Abnormal; Notable for the following components:   RBC 3.19 (*)    Hemoglobin 10.6 (*)    HCT 31.1 (*)    RDW 16.3 (*)    All other components within normal limits  ETHANOL - Abnormal; Notable for the following components:   Alcohol , Ethyl (B) 347 (*)    All other components within  normal limits  CBG MONITORING, ED - Abnormal; Notable for the following components:   Glucose-Capillary 125 (*)    All other components within normal limits  AMMONIA  PROTIME-INR  RAPID  URINE DRUG SCREEN, HOSP PERFORMED  TROPONIN I (HIGH SENSITIVITY)    Imaging Studies ordered: I ordered imaging studies including CT head I independently visualized and interpreted imaging. I agree with the radiologist interpretation   Medicines ordered and prescription drug management: Meds ordered this encounter  Medications   lactated ringers  bolus 1,000 mL   potassium chloride  SA (KLOR-CON  M) CR tablet 40 mEq   potassium chloride  (KLOR-CON ) packet 40 mEq   magnesium  oxide (MAG-OX) tablet 800 mg   potassium chloride  SA (KLOR-CON  M) 20 MEQ tablet    Sig: Take 1 tablet (20 mEq total) by mouth 2 (two) times daily for 5 days.    Dispense:  10 tablet    Refill:  0    -I have reviewed the patients home medicines and have made adjustments as needed  Critical interventions none    Cardiac Monitoring: The patient was maintained on a cardiac monitor.  I personally viewed and interpreted the cardiac monitored which showed an underlying rhythm of: NSR  Social Determinants of Health:  Factors impacting patients care include: Daily alcohol  use, counseled cessation and offered outpatient resources   Reevaluation: After the interventions noted above, I reevaluated the patient and found that they have :improved  Co morbidities that complicate the patient evaluation  Past Medical History:  Diagnosis Date   Alcohol  dependence with uncomplicated withdrawal (HCC) 06/15/2020   Alcoholic pancreatitis    Allergies    Arthritis    BACK   Asthma    Depression    Frequency of urination    History of Clostridium difficile colitis 07/10/2022   History of kidney stones    Hypertension    Leg pain    Left   Marijuana abuse 06/15/2020   Microhematuria    Right ureteral stone    Tobacco dependence 06/15/2020   Wears contact lenses       Dispostion: I considered admission for this patient, but at this time she does not meet inpatient criteria for admission will be discharged with  outpatient follow-up     Final Clinical Impression(s) / ED Diagnoses Final diagnoses:  Alcoholic intoxication without complication (HCC)  Orthostatic syncope  Hypokalemia     @PCDICTATION @    Albertina Dixon, MD 09/09/23 2306

## 2023-09-09 NOTE — ED Triage Notes (Addendum)
 BIB EMS from home. Called out for unresponsive patient.  Husband found her on the bed.  Pt was wakened some by EMS with ammonia and pt reports alcohol  today.  Pt remains on room air 96-97%, CO2 36-42.  Lungs CTA.  Pupils dilated, equal and reactive. Bruising on left eye is old per patient. Empty bottles of hydrocodone  and other benzos present on scene.  Husband speculated use of those with the alcohol .  22G LAC

## 2023-09-11 ENCOUNTER — Other Ambulatory Visit: Payer: Self-pay | Admitting: Gastroenterology

## 2023-09-11 DIAGNOSIS — D500 Iron deficiency anemia secondary to blood loss (chronic): Secondary | ICD-10-CM | POA: Diagnosis not present

## 2023-09-11 DIAGNOSIS — F1010 Alcohol abuse, uncomplicated: Secondary | ICD-10-CM | POA: Diagnosis not present

## 2023-09-11 DIAGNOSIS — R748 Abnormal levels of other serum enzymes: Secondary | ICD-10-CM

## 2023-09-11 DIAGNOSIS — K625 Hemorrhage of anus and rectum: Secondary | ICD-10-CM | POA: Diagnosis not present

## 2023-09-11 DIAGNOSIS — K921 Melena: Secondary | ICD-10-CM | POA: Diagnosis not present

## 2023-09-13 ENCOUNTER — Encounter: Payer: Self-pay | Admitting: Radiology

## 2023-09-13 ENCOUNTER — Ambulatory Visit
Admission: RE | Admit: 2023-09-13 | Discharge: 2023-09-13 | Disposition: A | Discharge status: Home / Self Care | Payer: Self-pay | Source: Ambulatory Visit | Attending: Gastroenterology | Admitting: Gastroenterology

## 2023-09-13 DIAGNOSIS — K625 Hemorrhage of anus and rectum: Secondary | ICD-10-CM

## 2023-09-13 DIAGNOSIS — K8689 Other specified diseases of pancreas: Secondary | ICD-10-CM | POA: Diagnosis not present

## 2023-09-13 DIAGNOSIS — R748 Abnormal levels of other serum enzymes: Secondary | ICD-10-CM

## 2023-09-13 DIAGNOSIS — F1010 Alcohol abuse, uncomplicated: Secondary | ICD-10-CM | POA: Diagnosis not present

## 2023-09-13 DIAGNOSIS — E876 Hypokalemia: Secondary | ICD-10-CM | POA: Diagnosis not present

## 2023-09-13 DIAGNOSIS — Q453 Other congenital malformations of pancreas and pancreatic duct: Secondary | ICD-10-CM | POA: Diagnosis not present

## 2023-09-13 MED ORDER — IOPAMIDOL (ISOVUE-300) INJECTION 61%
100.0000 mL | Freq: Once | INTRAVENOUS | Status: AC | PRN
  Administered 2023-09-13: 100 mL via INTRAVENOUS

## 2023-09-16 ENCOUNTER — Encounter (HOSPITAL_COMMUNITY): Payer: Self-pay | Admitting: Emergency Medicine

## 2023-09-16 ENCOUNTER — Emergency Department (HOSPITAL_COMMUNITY)
Admission: EM | Admit: 2023-09-16 | Discharge: 2023-09-16 | Disposition: A | Discharge status: Home / Self Care | Attending: Emergency Medicine | Admitting: Emergency Medicine

## 2023-09-16 ENCOUNTER — Emergency Department (HOSPITAL_COMMUNITY)

## 2023-09-16 ENCOUNTER — Other Ambulatory Visit: Payer: Self-pay

## 2023-09-16 DIAGNOSIS — Z79899 Other long term (current) drug therapy: Secondary | ICD-10-CM | POA: Diagnosis not present

## 2023-09-16 DIAGNOSIS — R7401 Elevation of levels of liver transaminase levels: Secondary | ICD-10-CM | POA: Diagnosis not present

## 2023-09-16 DIAGNOSIS — H538 Other visual disturbances: Secondary | ICD-10-CM | POA: Insufficient documentation

## 2023-09-16 DIAGNOSIS — E876 Hypokalemia: Secondary | ICD-10-CM | POA: Diagnosis not present

## 2023-09-16 DIAGNOSIS — R1013 Epigastric pain: Secondary | ICD-10-CM | POA: Insufficient documentation

## 2023-09-16 DIAGNOSIS — I6782 Cerebral ischemia: Secondary | ICD-10-CM | POA: Diagnosis not present

## 2023-09-16 DIAGNOSIS — Z7901 Long term (current) use of anticoagulants: Secondary | ICD-10-CM | POA: Insufficient documentation

## 2023-09-16 DIAGNOSIS — F10920 Alcohol use, unspecified with intoxication, uncomplicated: Secondary | ICD-10-CM | POA: Insufficient documentation

## 2023-09-16 DIAGNOSIS — J45909 Unspecified asthma, uncomplicated: Secondary | ICD-10-CM | POA: Diagnosis not present

## 2023-09-16 DIAGNOSIS — I10 Essential (primary) hypertension: Secondary | ICD-10-CM | POA: Diagnosis not present

## 2023-09-16 DIAGNOSIS — Y908 Blood alcohol level of 240 mg/100 ml or more: Secondary | ICD-10-CM | POA: Diagnosis not present

## 2023-09-16 DIAGNOSIS — R7989 Other specified abnormal findings of blood chemistry: Secondary | ICD-10-CM

## 2023-09-16 DIAGNOSIS — R109 Unspecified abdominal pain: Secondary | ICD-10-CM | POA: Diagnosis not present

## 2023-09-16 DIAGNOSIS — F10129 Alcohol abuse with intoxication, unspecified: Secondary | ICD-10-CM | POA: Diagnosis not present

## 2023-09-16 DIAGNOSIS — I672 Cerebral atherosclerosis: Secondary | ICD-10-CM | POA: Diagnosis not present

## 2023-09-16 LAB — COMPREHENSIVE METABOLIC PANEL WITH GFR
ALT: 35 U/L (ref 0–44)
AST: 379 U/L — ABNORMAL HIGH (ref 15–41)
Albumin: 3.3 g/dL — ABNORMAL LOW (ref 3.5–5.0)
Alkaline Phosphatase: 252 U/L — ABNORMAL HIGH (ref 38–126)
Anion gap: 14 (ref 5–15)
BUN: 15 mg/dL (ref 6–20)
CO2: 27 mmol/L (ref 22–32)
Calcium: 8.5 mg/dL — ABNORMAL LOW (ref 8.9–10.3)
Chloride: 95 mmol/L — ABNORMAL LOW (ref 98–111)
Creatinine, Ser: 0.58 mg/dL (ref 0.44–1.00)
GFR, Estimated: >60 mL/min (ref >60)
Glucose, Bld: 92 mg/dL (ref 70–99)
Potassium: 3.3 mmol/L — ABNORMAL LOW (ref 3.5–5.1)
Sodium: 136 mmol/L (ref 135–145)
Total Bilirubin: 1 mg/dL (ref 0.0–1.2)
Total Protein: 6.7 g/dL (ref 6.5–8.1)

## 2023-09-16 LAB — CBC WITH DIFFERENTIAL/PLATELET
Abs Immature Granulocytes: 0.01 K/uL (ref 0.00–0.07)
Basophils Absolute: 0.1 K/uL (ref 0.0–0.1)
Basophils Relative: 1 %
Eosinophils Absolute: 0.3 K/uL (ref 0.0–0.5)
Eosinophils Relative: 4 %
HCT: 32.1 % — ABNORMAL LOW (ref 36.0–46.0)
Hemoglobin: 10.9 g/dL — ABNORMAL LOW (ref 12.0–15.0)
Immature Granulocytes: 0 %
Lymphocytes Relative: 23 %
Lymphs Abs: 1.4 K/uL (ref 0.7–4.0)
MCH: 33 pg (ref 26.0–34.0)
MCHC: 34 g/dL (ref 30.0–36.0)
MCV: 97.3 fL (ref 80.0–100.0)
Monocytes Absolute: 0.8 K/uL (ref 0.1–1.0)
Monocytes Relative: 12 %
Neutro Abs: 3.8 K/uL (ref 1.7–7.7)
Neutrophils Relative %: 60 %
Platelets: 163 K/uL (ref 150–400)
RBC: 3.3 MIL/uL — ABNORMAL LOW (ref 3.87–5.11)
RDW: 15.8 % — ABNORMAL HIGH (ref 11.5–15.5)
WBC: 6.3 K/uL (ref 4.0–10.5)
nRBC: 0 % (ref 0.0–0.2)

## 2023-09-16 LAB — RESP PANEL BY RT-PCR (RSV, FLU A&B, COVID)  RVPGX2
Influenza A by PCR: NEGATIVE
Influenza B by PCR: NEGATIVE
Resp Syncytial Virus by PCR: NEGATIVE
SARS Coronavirus 2 by RT PCR: NEGATIVE

## 2023-09-16 LAB — LIPASE, BLOOD: Lipase: 73 U/L — ABNORMAL HIGH (ref 11–51)

## 2023-09-16 LAB — TROPONIN I (HIGH SENSITIVITY): Troponin I (High Sensitivity): 12 ng/L (ref <18)

## 2023-09-16 LAB — ETHANOL: Alcohol, Ethyl (B): 290 mg/dL — ABNORMAL HIGH (ref <15)

## 2023-09-16 MED ORDER — LACTATED RINGERS IV BOLUS
1000.0000 mL | Freq: Once | INTRAVENOUS | Status: AC
  Administered 2023-09-16: 1000 mL via INTRAVENOUS

## 2023-09-16 MED ORDER — ONDANSETRON HCL 4 MG/2ML IJ SOLN
4.0000 mg | Freq: Once | INTRAMUSCULAR | Status: DC
  Filled 2023-09-16: qty 2

## 2023-09-16 NOTE — ED Triage Notes (Signed)
 Pt to the ED with complaints of blurry vision that began 2 hours ago.  Pt is also complaining of abdominal pain since she was last here on the 24th.  Pt's husband reports shortness of breath, but when this nurse went out to the truck to help her out she was smoking a cigarette.

## 2023-09-16 NOTE — ED Provider Notes (Signed)
 [place] EMERGENCY DEPARTMENT AT [facility] Provider Note   CSN: [id number] Arrival date & time: 09/16/23  1422     Patient presents with: Blurred Vision   Suzanne Jackson is a 53 y.o. female.  Past medical history of asthma, hypertension, alcohol  dependence, tobacco dependence, marijuana abuse, pancreatitis.  She presents to the ER today with her husband at bedside.  She states she noticed that her vision was blurry when she woke up this morning.  She went to bed around 2 AM and had normal vision at that time.  She states she does not having upper abdominal pain and chest pain that feels like her pancreatitis that started yesterday.  She denies any numbness tingling or weakness but her husband states she seems to be confused today.  She has been drinking alcohol  today and has drank a pint of Jagermeister so far.  Patient reports she wears contact lenses.     HPI     Prior to Admission medications   Medication Sig Start Date End Date Taking? Authorizing Provider  albuterol  (PROVENTIL ) (2.5 MG/3ML) 0.083% nebulizer solution Take 3 mLs by nebulization in the morning, at noon, in the evening, and at bedtime. 05/14/23   [provider]  albuterol  (VENTOLIN  HFA) 108 (90 Base) MCG/ACT inhaler Inhale 1 puff into the lungs every 4 (four) hours as needed for shortness of breath. 03/15/23   [provider]  amLODipine  (NORVASC ) 10 MG tablet Take 10 mg by mouth daily.  10/11/16   [provider]  budesonide-formoterol  (SYMBICORT) 160-4.5 MCG/ACT inhaler Inhale 1 puff into the lungs 2 (two) times daily.    [provider]  chlordiazePOXIDE (LIBRIUM) 25 MG capsule Take 25 mg by mouth 4 (four) times daily as needed for anxiety or withdrawal. 05/18/23   [provider]  clopidogrel (PLAVIX) 75 MG tablet Take 75 mg by mouth daily. 06/28/23   [provider]  Disulfiram  500 MG TABS Take 1 tablet (500 mg total) by mouth daily. 06/18/20   Vann,  Jessica U, DO  DULoxetine  (CYMBALTA ) 60 MG capsule Take 60 mg by mouth daily. 09/18/19   [provider]  ELIQUIS 5 MG TABS tablet Take 5 mg by mouth 2 (two) times daily.    [provider]  FARXIGA 10 MG TABS tablet Take 10 mg by mouth daily.    [provider]  fexofenadine (ALLEGRA) 180 MG tablet Take 180 mg by mouth daily.    [provider]  furosemide  (LASIX ) 20 MG tablet Take 1 tablet (20 mg total) by mouth daily. 07/24/23   Barrett, Warren SAILOR, PA-C  hydrochlorothiazide  (MICROZIDE ) 12.5 MG capsule Take 12.5 mg by mouth daily. 10/11/16   [provider]  HYDROcodone -acetaminophen  (NORCO) 7.5-325 MG tablet Take 1 tablet by mouth every 4 (four) hours as needed. 09/04/23   [provider]  hydrOXYzine  (ATARAX /VISTARIL ) 50 MG tablet Take 1 tablet (50 mg total) by mouth every 6 (six) hours as needed (refractory itching). 08/31/17   McKenzie, Kayla J, PA-C  LORazepam  (ATIVAN ) 1 MG tablet Take 1 mg by mouth 2 (two) times daily as needed for anxiety. 07/30/23   [provider]  losartan (COZAAR) 25 MG tablet Take 25 mg by mouth daily. 08/09/23   [provider]  metoprolol succinate (TOPROL-XL) 50 MG 24 hr tablet Take 50 mg by mouth daily.    [provider]  montelukast  (SINGULAIR ) 10 MG tablet Take 10 mg by mouth every morning.  11/21/12  [provider]  mupirocin  ointment (BACTROBAN ) 2 % Apply 1 Application topically 2 (two) times daily. 01/18/22   McDonald, Juliene SAUNDERS, DPM  Potassium Chloride  ER 20 MEQ TBCR Take 1 tablet by mouth in the morning and at bedtime.    [provider]  potassium chloride  SA (K-DUR,KLOR-CON ) 20 MEQ tablet Take 20 mEq by mouth daily.    [provider]  potassium chloride  SA (KLOR-CON  M) 20 MEQ tablet Take 1 tablet (20 mEq total) by mouth 2 (two) times daily for 5 days. 09/09/23 09/14/23  Kommor, Madison, MD  rosuvastatin (CRESTOR) 5 MG tablet Take 5 mg by mouth at bedtime. 05/28/23    [provider]  TRELEGY ELLIPTA 100-62.5-25 MCG/ACT AEPB Take 1 puff by mouth daily.    [provider]    Allergies: Aspirin, Ibuprofen, Oxycodone -acetaminophen , Aleve [naproxen], and Bupropion    Review of Systems  Updated Vital Signs BP 95/63 (BP Location: Right Arm)   Pulse 72   Temp 98.2 F (36.8 C) (Oral)   Resp 18   Ht 5' 1 (1.549 m)   Wt 52.2 kg   SpO2 95%   BMI 21.73 kg/m   Physical Exam Vitals and nursing note reviewed.  Constitutional:      General: She is not in acute distress.    Appearance: She is well-developed.  HENT:     Head: Normocephalic and atraumatic.     Mouth/Throat:     Mouth: Mucous membranes are dry.  Eyes:     Conjunctiva/sclera: Conjunctivae normal.  Cardiovascular:     Rate and Rhythm: Normal rate and regular rhythm.     Heart sounds: No murmur heard. Pulmonary:     Effort: Pulmonary effort is normal. No respiratory distress.     Breath sounds: Normal breath sounds.  Abdominal:     Palpations: Abdomen is soft.     Tenderness: There is abdominal tenderness in the epigastric area. There is no guarding or rebound.  Musculoskeletal:        General: No swelling.     Cervical back: Neck supple.  Skin:    General: Skin is warm and dry.     Capillary Refill: Capillary refill takes less than 2 seconds.  Neurological:     Mental Status: She is alert.  Psychiatric:        Mood and Affect: Mood normal.     (all labs ordered are listed, but only abnormal results are displayed) Labs Reviewed  COMPREHENSIVE METABOLIC PANEL WITH GFR - Abnormal; Notable for the following components:      Result Value   Potassium 3.3 (*)    Chloride 95 (*)    Calcium 8.5 (*)    Albumin 3.3 (*)    AST 379 (*)    Alkaline Phosphatase 252 (*)    All other components within normal limits  CBC WITH DIFFERENTIAL/PLATELET - Abnormal; Notable for the following components:   RBC 3.30 (*)    Hemoglobin 10.9 (*)    HCT 32.1 (*)    RDW 15.8 (*)     All other components within normal limits  ETHANOL - Abnormal; Notable for the following components:   Alcohol , Ethyl (B) 290 (*)    All other components within normal limits  LIPASE, BLOOD - Abnormal; Notable for the following components:   Lipase 73 (*)    All other components within normal limits  RESP PANEL BY RT-PCR (RSV, FLU A&B, COVID)  RVPGX2  TROPONIN I (HIGH SENSITIVITY)  EKG: EKG Interpretation Date/Time:  Sunday September 16 2023 15:23:54 EDT Ventricular Rate:  68 PR Interval:  154 QRS Duration:  128 QT Interval:  463 QTC Calculation: 493 R Axis:   46  Text Interpretation: Sinus rhythm Probable left ventricular hypertrophy Abnormal T, consider ischemia, lateral leads J point elevated, probably due to LVH Similar in morphology to previous EKG Confirmed by Ula Barter [id number]) on 09/16/2023 3:27:38 PM  Radiology: CT Head Wo Contrast Result Date: 09/16/2023 CLINICAL DATA:  Mental status change, unknown cause Neuro deficit, acute, stroke suspected bilateral blurry vision EXAM: CT HEAD WITHOUT CONTRAST TECHNIQUE: Contiguous axial images were obtained from the base of the skull through the vertex without intravenous contrast. RADIATION DOSE REDUCTION: This exam was performed according to the departmental dose-optimization program which includes automated exposure control, adjustment of the mA and/or kV according to patient size and/or use of iterative reconstruction technique. COMPARISON:  Head CT 1 week ago 09/09/2023 FINDINGS: Brain: No intracranial hemorrhage, mass effect, or midline shift. Brain volume is normal for age. No hydrocephalus. The basilar cisterns are patent. Periventricular and deep white matter hypodensity typical of chronic small vessel ischemia. Remote right cerebellar lacunar infarct, unchanged. No evidence of territorial infarct or acute ischemia. No extra-axial or intracranial fluid collection. Vascular: Atherosclerosis of skullbase vasculature without hyperdense  vessel or abnormal calcification. Skull: No fracture or focal lesion. Sinuses/Orbits: Diffuse mucosal thickening, partially included in the field of view. This is unchanged from prior. Other: None. IMPRESSION: 1. No acute intracranial abnormality. 2. Chronic small vessel ischemia. Remote right cerebellar lacunar infarct. Electronically Signed   By: Andrea Gasman M.D.   On: 09/16/2023 15:44     Procedures   Medications Ordered in the ED  ondansetron  (ZOFRAN ) injection 4 mg (4 mg Intravenous Patient Refused/Not Given 09/16/23 1637)  lactated ringers  bolus 1,000 mL (0 mLs Intravenous Stopped 09/16/23 1637)    Clinical Course as of 09/16/23 1827  Sun Sep 16, 2023  1521 Patient presents complaining of blurry vision since waking up this morning and then also has been having abdominal pain and nausea that feels like her pancreatitis.  She had no other focal neurologic deficits.  Patient did not think she was wearing her contact lenses but I could see that she was on exam, she attempted to remove it and subsequently was found to be wearing 2 pairs of contact lenses.  After removing the top pair her vision had returned to normal.  She subsequently removed both pairs.  She feels that her vision is at her baseline now. [CB]    Clinical Course User Index [CB] Suellen Sherran LABOR, PA-C                                 Medical Decision Making This patient presents to the ED for concern of bilateral blurry vision since waking up this morning and abdominal pain, this involves an extensive number of treatment options, and is a complaint that carries with it a high risk of complications and morbidity.  The differential diagnosis includes CVA, alcohol  intoxication, electrolyte disturbance, pancreatitis, cholecystitis, dehydration, colitis, ACS, PE, pneumonia, other   Co morbidities that complicate the patient evaluation  Alcohol  use disorder, pancreatitis, cardiomyopathy   Additional history  obtained:  Additional history obtained from EMR External records from outside source obtained and reviewed including prior notes and labs   Lab Tests:  I Ordered, and personally interpreted labs.  The pertinent  results include:   COVID flu RSV negative, lipase slightly elevated at 73, troponin normal at 12, CMP with potassium 3.3, ethanol level is 290, CBC with no leukocytosis, hemoglobin 10.9 which is around patient's baseline.    Imaging Studies ordered:  I ordered imaging studies including CT head I independently visualized and interpreted imaging which showed remote occipital stroke similar to previous CT I agree with the radiologist interpretation     Problem List / ED Course / Critical interventions / Medication management  1.  Blurry vision-patient was wearing 2 contact lenses in each eye and vision changes resolved after removing these.  Head CT did not show any acute findings she has no other focal neurologic deficits I do not feel her vision changes are related to an acute emergency.  2.  Alcohol  intoxication-after IV fluids and nausea medicine patient is feeling better usual self and is clinically sober.  Her husband is here at bedside and is going to be taking her home today.  3.  Abdominal and chest pain-patient has some mild tenderness, states it feels like her pain she has been having which she is being worked up with for GI.  Lipase is 73 which is much less than in the past when she had acute pancreatitis, she did have elevated LFTs today with AST of 379 and alk phos 232 but her bilirubin is normal.  Patient had a CT on 8/28 which the CT tech was able to have radiology read stat.  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  Given the elevation of the LFTs I did discuss with the patient that we could get a CT to find out if there are any acute inflammatory changes or obstructive changes today.  She states she is feeling much better and adamantly does not want any further testing and wants to go home with her husband.  She is already established with [facility] and requested that he be able to follow-up with them.  I sent a secure message to her GI physician to ensure she can obtain outpatient follow-up closely.  Patient was agreeable with plan of care and discharge and was instructed to come to the ER if she has any new or worsening symptoms. I ordered medication including IV fluids for dehydration Reevaluation of the patient after these medicines showed that the patient improved I have reviewed the patients home medicines and have made adjustments as needed       Amount and/or Complexity of Data Reviewed Labs: ordered. Radiology: ordered.  Risk Prescription drug management.        Final diagnoses:  Epigastric pain  Elevated LFTs  Alcoholic intoxication without complication [facility])  Blurry vision, bilateral    ED Discharge Orders     None          Suellen Sherran DELENA DEVONNA 09/16/23 1827    Melvenia Motto, MD 09/17/23 1409

## 2023-09-16 NOTE — ED Notes (Signed)
 Pt/family received d/c paperwork at this time. After going over the paperwork any questions, comments, or concerns were answered to the best of this nurse's knowledge. The pt/family verbally acknowledged the teachings/instructions.

## 2023-09-16 NOTE — Discharge Instructions (Signed)
 In the ER today for abdominal pain and blurry vision.  Your blurry vision was due to wearing multiple pairs of contact lenses and fortunately got better after removing the contacts.  Your CT scan of your head showed an old stroke but no acute problems.  Your blood work did show elevated liver function test.  Your CT of your abdomen from several days ago was read and you have a large mass in your pancreas that is possibly a pseudocyst but radiology recommended follow-up for possible biopsy.  As you do not want further imaging today I have sent message to your GI office to help ensure close follow-up but you should call for an appointment as well.  You have any new or worsening symptoms come back to the ER right away.

## 2023-09-19 ENCOUNTER — Other Ambulatory Visit: Payer: Self-pay | Admitting: Gastroenterology

## 2023-09-20 DIAGNOSIS — R55 Syncope and collapse: Secondary | ICD-10-CM | POA: Diagnosis not present

## 2023-09-20 DIAGNOSIS — R4182 Altered mental status, unspecified: Secondary | ICD-10-CM | POA: Diagnosis not present

## 2023-09-20 DIAGNOSIS — F10129 Alcohol abuse with intoxication, unspecified: Secondary | ICD-10-CM | POA: Diagnosis not present

## 2023-09-20 DIAGNOSIS — Y908 Blood alcohol level of 240 mg/100 ml or more: Secondary | ICD-10-CM | POA: Diagnosis not present

## 2023-09-20 DIAGNOSIS — Z5329 Procedure and treatment not carried out because of patient's decision for other reasons: Secondary | ICD-10-CM | POA: Diagnosis not present

## 2023-09-21 ENCOUNTER — Emergency Department (HOSPITAL_BASED_OUTPATIENT_CLINIC_OR_DEPARTMENT_OTHER): Admitting: Radiology

## 2023-09-21 ENCOUNTER — Emergency Department (HOSPITAL_BASED_OUTPATIENT_CLINIC_OR_DEPARTMENT_OTHER)

## 2023-09-21 ENCOUNTER — Encounter (HOSPITAL_BASED_OUTPATIENT_CLINIC_OR_DEPARTMENT_OTHER): Payer: Self-pay

## 2023-09-21 ENCOUNTER — Other Ambulatory Visit: Payer: Self-pay | Admitting: Family Medicine

## 2023-09-21 ENCOUNTER — Inpatient Hospital Stay (HOSPITAL_BASED_OUTPATIENT_CLINIC_OR_DEPARTMENT_OTHER)
Admission: EM | Admit: 2023-09-21 | Discharge: 2023-09-24 | DRG: 439 | Disposition: A | Discharge status: Home / Self Care | Attending: Internal Medicine | Admitting: Internal Medicine

## 2023-09-21 ENCOUNTER — Other Ambulatory Visit: Payer: Self-pay

## 2023-09-21 DIAGNOSIS — I426 Alcoholic cardiomyopathy: Secondary | ICD-10-CM | POA: Diagnosis not present

## 2023-09-21 DIAGNOSIS — F10930 Alcohol use, unspecified with withdrawal, uncomplicated: Principal | ICD-10-CM

## 2023-09-21 DIAGNOSIS — Z885 Allergy status to narcotic agent status: Secondary | ICD-10-CM

## 2023-09-21 DIAGNOSIS — Z7901 Long term (current) use of anticoagulants: Secondary | ICD-10-CM

## 2023-09-21 DIAGNOSIS — Z7902 Long term (current) use of antithrombotics/antiplatelets: Secondary | ICD-10-CM

## 2023-09-21 DIAGNOSIS — Z981 Arthrodesis status: Secondary | ICD-10-CM | POA: Diagnosis not present

## 2023-09-21 DIAGNOSIS — Z79899 Other long term (current) drug therapy: Secondary | ICD-10-CM | POA: Diagnosis not present

## 2023-09-21 DIAGNOSIS — I513 Intracardiac thrombosis, not elsewhere classified: Secondary | ICD-10-CM | POA: Diagnosis not present

## 2023-09-21 DIAGNOSIS — Z888 Allergy status to other drugs, medicaments and biological substances status: Secondary | ICD-10-CM | POA: Diagnosis not present

## 2023-09-21 DIAGNOSIS — R7989 Other specified abnormal findings of blood chemistry: Secondary | ICD-10-CM | POA: Diagnosis present

## 2023-09-21 DIAGNOSIS — K852 Alcohol induced acute pancreatitis without necrosis or infection: Secondary | ICD-10-CM | POA: Diagnosis not present

## 2023-09-21 DIAGNOSIS — Z9049 Acquired absence of other specified parts of digestive tract: Secondary | ICD-10-CM

## 2023-09-21 DIAGNOSIS — K860 Alcohol-induced chronic pancreatitis: Secondary | ICD-10-CM | POA: Diagnosis present

## 2023-09-21 DIAGNOSIS — I110 Hypertensive heart disease with heart failure: Secondary | ICD-10-CM | POA: Diagnosis present

## 2023-09-21 DIAGNOSIS — F10239 Alcohol dependence with withdrawal, unspecified: Secondary | ICD-10-CM | POA: Diagnosis not present

## 2023-09-21 DIAGNOSIS — I5022 Chronic systolic (congestive) heart failure: Secondary | ICD-10-CM | POA: Diagnosis present

## 2023-09-21 DIAGNOSIS — R531 Weakness: Secondary | ICD-10-CM | POA: Diagnosis present

## 2023-09-21 DIAGNOSIS — E876 Hypokalemia: Secondary | ICD-10-CM | POA: Diagnosis present

## 2023-09-21 DIAGNOSIS — F10230 Alcohol dependence with withdrawal, uncomplicated: Secondary | ICD-10-CM | POA: Diagnosis not present

## 2023-09-21 DIAGNOSIS — R079 Chest pain, unspecified: Secondary | ICD-10-CM | POA: Diagnosis not present

## 2023-09-21 DIAGNOSIS — Z7951 Long term (current) use of inhaled steroids: Secondary | ICD-10-CM

## 2023-09-21 DIAGNOSIS — J449 Chronic obstructive pulmonary disease, unspecified: Secondary | ICD-10-CM | POA: Diagnosis not present

## 2023-09-21 DIAGNOSIS — K859 Acute pancreatitis without necrosis or infection, unspecified: Secondary | ICD-10-CM | POA: Diagnosis present

## 2023-09-21 DIAGNOSIS — Z23 Encounter for immunization: Secondary | ICD-10-CM | POA: Diagnosis not present

## 2023-09-21 DIAGNOSIS — Z886 Allergy status to analgesic agent status: Secondary | ICD-10-CM | POA: Diagnosis not present

## 2023-09-21 DIAGNOSIS — I517 Cardiomegaly: Secondary | ICD-10-CM | POA: Diagnosis not present

## 2023-09-21 DIAGNOSIS — F17210 Nicotine dependence, cigarettes, uncomplicated: Secondary | ICD-10-CM | POA: Diagnosis not present

## 2023-09-21 DIAGNOSIS — R918 Other nonspecific abnormal finding of lung field: Secondary | ICD-10-CM | POA: Diagnosis not present

## 2023-09-21 DIAGNOSIS — I700 Atherosclerosis of aorta: Secondary | ICD-10-CM | POA: Diagnosis not present

## 2023-09-21 DIAGNOSIS — R0789 Other chest pain: Secondary | ICD-10-CM | POA: Diagnosis present

## 2023-09-21 DIAGNOSIS — Z87442 Personal history of urinary calculi: Secondary | ICD-10-CM

## 2023-09-21 DIAGNOSIS — R9431 Abnormal electrocardiogram [ECG] [EKG]: Secondary | ICD-10-CM | POA: Diagnosis not present

## 2023-09-21 DIAGNOSIS — Z1231 Encounter for screening mammogram for malignant neoplasm of breast: Secondary | ICD-10-CM

## 2023-09-21 LAB — COMPREHENSIVE METABOLIC PANEL WITH GFR
ALT: 18 U/L (ref 0–44)
AST: 56 U/L — ABNORMAL HIGH (ref 15–41)
Albumin: 4 g/dL (ref 3.5–5.0)
Alkaline Phosphatase: 221 U/L — ABNORMAL HIGH (ref 38–126)
Anion gap: 19 — ABNORMAL HIGH (ref 5–15)
BUN: 8 mg/dL (ref 6–20)
CO2: 24 mmol/L (ref 22–32)
Calcium: 9.5 mg/dL (ref 8.9–10.3)
Chloride: 95 mmol/L — ABNORMAL LOW (ref 98–111)
Creatinine, Ser: 0.61 mg/dL (ref 0.44–1.00)
GFR, Estimated: >60 mL/min (ref >60)
Glucose, Bld: 128 mg/dL — ABNORMAL HIGH (ref 70–99)
Potassium: 3.2 mmol/L — ABNORMAL LOW (ref 3.5–5.1)
Sodium: 137 mmol/L (ref 135–145)
Total Bilirubin: 1.4 mg/dL — ABNORMAL HIGH (ref 0.0–1.2)
Total Protein: 7.3 g/dL (ref 6.5–8.1)

## 2023-09-21 LAB — CBC
HCT: 31.7 % — ABNORMAL LOW (ref 36.0–46.0)
Hemoglobin: 10.7 g/dL — ABNORMAL LOW (ref 12.0–15.0)
MCH: 31.4 pg (ref 26.0–34.0)
MCHC: 33.8 g/dL (ref 30.0–36.0)
MCV: 93 fL (ref 80.0–100.0)
Platelets: 153 K/uL (ref 150–400)
RBC: 3.41 MIL/uL — ABNORMAL LOW (ref 3.87–5.11)
RDW: 15.3 % (ref 11.5–15.5)
WBC: 7.6 K/uL (ref 4.0–10.5)
nRBC: 0 % (ref 0.0–0.2)

## 2023-09-21 LAB — ETHANOL: Alcohol, Ethyl (B): <15 mg/dL (ref <15)

## 2023-09-21 MED ORDER — THIAMINE MONONITRATE 100 MG PO TABS
100.0000 mg | ORAL_TABLET | Freq: Every day | ORAL | Status: DC
  Administered 2023-09-22 – 2023-09-24 (×3): 100 mg via ORAL
  Filled 2023-09-21 (×3): qty 1

## 2023-09-21 MED ORDER — THIAMINE HCL 100 MG/ML IJ SOLN
100.0000 mg | Freq: Every day | INTRAMUSCULAR | Status: DC
  Filled 2023-09-21: qty 2

## 2023-09-21 MED ORDER — METOCLOPRAMIDE HCL 5 MG/ML IJ SOLN
10.0000 mg | Freq: Once | INTRAMUSCULAR | Status: AC
  Administered 2023-09-21: 10 mg via INTRAVENOUS
  Filled 2023-09-21: qty 2

## 2023-09-21 MED ORDER — LACTATED RINGERS IV BOLUS
1000.0000 mL | Freq: Once | INTRAVENOUS | Status: AC
  Administered 2023-09-21: 1000 mL via INTRAVENOUS

## 2023-09-21 MED ORDER — ADULT MULTIVITAMIN W/MINERALS CH
1.0000 | ORAL_TABLET | Freq: Every day | ORAL | Status: DC
  Administered 2023-09-22 – 2023-09-24 (×3): 1 via ORAL
  Filled 2023-09-21 (×3): qty 1

## 2023-09-21 MED ORDER — LORAZEPAM 2 MG/ML IJ SOLN
1.0000 mg | INTRAMUSCULAR | Status: DC | PRN
  Filled 2023-09-21: qty 3

## 2023-09-21 MED ORDER — LORAZEPAM 1 MG PO TABS
0.0000 mg | ORAL_TABLET | Freq: Four times a day (QID) | ORAL | Status: AC
  Administered 2023-09-21 – 2023-09-22 (×5): 2 mg via ORAL
  Administered 2023-09-23: 1 mg via ORAL
  Administered 2023-09-23: 2 mg via ORAL
  Filled 2023-09-21 (×2): qty 2
  Filled 2023-09-21: qty 1
  Filled 2023-09-21 (×4): qty 2

## 2023-09-21 MED ORDER — FOLIC ACID 1 MG PO TABS
1.0000 mg | ORAL_TABLET | Freq: Every day | ORAL | Status: DC
  Administered 2023-09-22 – 2023-09-24 (×3): 1 mg via ORAL
  Filled 2023-09-21 (×3): qty 1

## 2023-09-21 MED ORDER — LORAZEPAM 1 MG PO TABS: 0.0000 mg | ORAL_TABLET | Freq: Two times a day (BID) | ORAL | Status: DC

## 2023-09-21 MED ORDER — LORAZEPAM 1 MG PO TABS
1.0000 mg | ORAL_TABLET | ORAL | Status: DC | PRN
  Administered 2023-09-22: 2 mg via ORAL
  Administered 2023-09-22: 1 mg via ORAL
  Administered 2023-09-23: 2 mg via ORAL
  Filled 2023-09-21: qty 2
  Filled 2023-09-21 (×2): qty 1
  Filled 2023-09-21: qty 2

## 2023-09-21 NOTE — ED Provider Notes (Signed)
 EMERGENCY DEPARTMENT PROVIDER NOTE [facility] [place]   Patient Name: Suzanne Jackson Date of Birth:  Nov 27, 1970 MRN: [id number]  History of Present Illness  HPI  Chief Complaint  Patient presents with  . Altered Mental Status    Pt arrives with husband. States they were driving and she just started acting sleepy. Pt denies drugs or alcohol . Pt is drowsy.     HPI     Medical and Social History  Patient History Medical History[1] Surgical History[2] Family History[3] Social History[4]    Review of Systems  Review of Systems Review of Systems    Physical Exam  Physical Exam ED Triage Vitals [09/20/23 1829]  Temperature Heart Rate Resp BP SpO2  36.6 C (97.9 F) (!) 95 16 (!) 131/78 97 %    Temp Source Heart Rate Source Patient Position BP Location FiO2 (%)  Oral -- -- Right arm --   Physical Exam     ED Course and Medical Decision Making   Medical Decision Making Patient pulled out her own IV and requested to leave against medical advice.  She was awake alert.  I did advise further observation or admission given the degree of her intoxication and altered mental status but she is now clinically sober has capacity make decisions.  She ambulated well out of the emergency department.  Advised that she is always welcome to return  Amount and/or Complexity of Data Reviewed Labs: ordered. Radiology: ordered. ECG/medicine tests: ordered.  Risk Prescription drug management.    ED Course as of 09/21/23 0444  Others' Documentation  Thu Sep 20, 2023  1903 Patient's husband has now arrived along with the patient's father.  He confirms that the patient has consumed alcohol  today, but he suggests that it was not enough alcohol  to cause her current symptoms.  He states no other drugs that he is aware of.  He states no trauma. [KJ]  1904 BP(!): 131/78 [KJ]  1904 Heart Rate(!): 95 [KJ]  1904 Resp: 16 [KJ]  1904 SpO2: 97 % [KJ]  1904 Temperature: 36.6  C (97.9 F) [KJ]  1956 Per my interpretation, CT head demonstrates no acute abnormalities. [KJ]  2000 Chest x-ray is interpreted by me and demonstrates no acute abnormalities.  Narrow QRS   [KJ]  2001 Patient's mental status improved somewhat with Narcan, but it was subtle. [KJ]    ED Course User Index [KJ] Vinie CHRISTELLA Louder, MD      Clinical Impressions as of 09/21/23 0444  Alcohol  intoxication    ED Disposition  Ama   ED Discharge Medications   ED Prescriptions   None    Only medications prescribed or modified are during this ED encounter are included in this list.       [1] No past medical history on file. [2] No past surgical history on file. [3] No family history on file. [4]    Carlin B Corsino, DO 09/21/23 9555

## 2023-09-21 NOTE — ED Provider Notes (Signed)
 [place] EMERGENCY DEPARTMENT AT [facility] Provider Note   CSN: [id number] Arrival date & time: 09/21/23  1849     Patient presents with: Alcohol  Problem   Suzanne Jackson is a 53 y.o. female.    Hx hypertension, COPD, chronic alcohol  use, possible dilated alcoholic cardiomyopathy, left ventricle thrombus, ongoing smoking. EF 20-25%.  Patient presents requesting alcohol  detox.  States she normally drinks about 1/5 a day between her and her husband.  Last drink was during the day yesterday greater than 24 hours ago.  She is feeling tremulous, shaky and nausea but no denies vomiting.  No p.o. intake today.  Reports having tremors but no history of seizures in the past.  Has not tried to detox in the past.  Was told by her doctor she should come to the hospital given her cardiac history.  Since she has been waiting she developed central chest pain and pressure since about 8 PM is fairly constant.  Does not have any radiation to her arms, neck or back.  No shortness of breath.  Nausea but no vomiting.  No diaphoresis.  Denies any other drug use.  No SI or HI.  The history is provided by the patient.  Alcohol  Problem Pertinent negatives include no chest pain, no abdominal pain, no headaches and no shortness of breath.       Prior to Admission medications   Medication Sig Start Date End Date Taking? Authorizing Provider  albuterol  (PROVENTIL ) (2.5 MG/3ML) 0.083% nebulizer solution Take 3 mLs by nebulization in the morning, at noon, in the evening, and at bedtime. 05/14/23   [provider]  albuterol  (VENTOLIN  HFA) 108 (90 Base) MCG/ACT inhaler Inhale 1 puff into the lungs every 4 (four) hours as needed for shortness of breath. 03/15/23   [provider]  amLODipine  (NORVASC ) 10 MG tablet Take 10 mg by mouth daily.  10/11/16   [provider]  budesonide -formoterol  (SYMBICORT ) 160-4.5 MCG/ACT inhaler Inhale 1 puff into the lungs 2 (two) times daily.     [provider]  chlordiazePOXIDE (LIBRIUM) 25 MG capsule Take 25 mg by mouth 4 (four) times daily as needed for anxiety or withdrawal. 05/18/23   [provider]  clopidogrel (PLAVIX) 75 MG tablet Take 75 mg by mouth daily. 06/28/23   [provider]  Disulfiram  500 MG TABS Take 1 tablet (500 mg total) by mouth daily. 06/18/20   Vann, Jessica U, DO  DULoxetine  (CYMBALTA ) 60 MG capsule Take 60 mg by mouth daily. 09/18/19   [provider]  ELIQUIS  5 MG TABS tablet Take 5 mg by mouth 2 (two) times daily.    [provider]  FARXIGA 10 MG TABS tablet Take 10 mg by mouth daily.    [provider]  fexofenadine (ALLEGRA) 180 MG tablet Take 180 mg by mouth daily.    [provider]  furosemide  (LASIX ) 20 MG tablet Take 1 tablet (20 mg total) by mouth daily. 07/24/23   Barrett, Warren SAILOR, PA-C  hydrochlorothiazide  (MICROZIDE ) 12.5 MG capsule Take 12.5 mg by mouth daily. 10/11/16   [provider]  HYDROcodone -acetaminophen  (NORCO) 7.5-325 MG tablet Take 1 tablet by mouth every 4 (four) hours as needed. 09/04/23   [provider]  hydrOXYzine  (ATARAX /VISTARIL ) 50 MG tablet Take 1 tablet (50 mg total) by mouth every 6 (six) hours as needed (refractory itching). 08/31/17   McKenzie, Kayla J, PA-C  LORazepam  (ATIVAN ) 1 MG tablet Take 1 mg by mouth 2 (two) times  daily as needed for anxiety. 07/30/23   [provider]  losartan (COZAAR) 25 MG tablet Take 25 mg by mouth daily. 08/09/23   [provider]  metoprolol succinate (TOPROL-XL) 50 MG 24 hr tablet Take 50 mg by mouth daily.    [provider]  montelukast  (SINGULAIR ) 10 MG tablet Take 10 mg by mouth every morning.  11/21/12   [provider]  mupirocin  ointment (BACTROBAN ) 2 % Apply 1 Application topically 2 (two) times daily. 01/18/22   McDonald, Juliene SAUNDERS, DPM  Potassium Chloride  ER 20 MEQ TBCR Take 1 tablet by mouth in the morning and at bedtime.     [provider]  potassium chloride  SA (K-DUR,KLOR-CON ) 20 MEQ tablet Take 20 mEq by mouth daily.    [provider]  potassium chloride  SA (KLOR-CON  M) 20 MEQ tablet Take 1 tablet (20 mEq total) by mouth 2 (two) times daily for 5 days. 09/09/23 09/14/23  Kommor, Madison, MD  rosuvastatin (CRESTOR) 5 MG tablet Take 5 mg by mouth at bedtime. 05/28/23   [provider]  TRELEGY ELLIPTA 100-62.5-25 MCG/ACT AEPB Take 1 puff by mouth daily.    [provider]    Allergies: Aspirin, Ibuprofen, Oxycodone -acetaminophen , Aleve [naproxen], and Bupropion    Review of Systems  Constitutional:  Negative for activity change, appetite change and fever.  HENT:  Negative for congestion and rhinorrhea.   Respiratory:  Positive for chest tightness. Negative for shortness of breath.   Cardiovascular:  Negative for chest pain.  Gastrointestinal:  Positive for nausea. Negative for abdominal pain and vomiting.  Genitourinary:  Negative for dysuria and hematuria.  Musculoskeletal:  Negative for arthralgias and myalgias.  Skin:  Negative for rash.  Neurological:  Negative for dizziness, weakness and headaches.   all other systems are negative except as noted in the HPI and PMH.    Updated Vital Signs BP 127/62   Pulse 90   Temp 97.8 F (36.6 C)   Resp 15   SpO2 98%   Physical Exam Vitals and nursing note reviewed.  Constitutional:      General: She is not in acute distress.    Appearance: She is well-developed.     Comments: Mildly tremulous  HENT:     Head: Normocephalic and atraumatic.     Mouth/Throat:     Pharynx: No oropharyngeal exudate.  Eyes:     Conjunctiva/sclera: Conjunctivae normal.     Pupils: Pupils are equal, round, and reactive to light.  Neck:     Comments: No meningismus. Cardiovascular:     Rate and Rhythm: Normal rate and regular rhythm.     Heart sounds: Normal heart sounds. No murmur heard. Pulmonary:     Effort: Pulmonary effort is  normal. No respiratory distress.     Breath sounds: Normal breath sounds.  Chest:     Chest wall: No tenderness.  Abdominal:     Palpations: Abdomen is soft.     Tenderness: There is abdominal tenderness. There is guarding. There is no rebound.  Musculoskeletal:        General: No tenderness. Normal range of motion.     Cervical back: Normal range of motion and neck supple.  Skin:    General: Skin is warm.  Neurological:     Mental Status: She is alert and oriented to person, place, and time.     Cranial Nerves: No cranial nerve deficit.     Motor: No abnormal muscle tone.     Coordination:  Coordination normal.     Comments:  5/5 strength throughout. CN 2-12 intact.Equal grip strength.   Psychiatric:        Behavior: Behavior normal.     (all labs ordered are listed, but only abnormal results are displayed) Labs Reviewed  COMPREHENSIVE METABOLIC PANEL WITH GFR - Abnormal; Notable for the following components:      Result Value   Potassium 3.2 (*)    Chloride 95 (*)    Glucose, Bld 128 (*)    AST 56 (*)    Alkaline Phosphatase 221 (*)    Total Bilirubin 1.4 (*)    Anion gap 19 (*)    All other components within normal limits  CBC - Abnormal; Notable for the following components:   RBC 3.41 (*)    Hemoglobin 10.7 (*)    HCT 31.7 (*)    All other components within normal limits  URINE DRUG SCREEN - Abnormal; Notable for the following components:   Opiates NEGATIVE (*)    Cocaine NEGATIVE (*)    Benzodiazepines NEGATIVE (*)    Amphetamines NEGATIVE (*)    Tetrahydrocannabinol NEGATIVE (*)    Barbiturates NEGATIVE (*)    Methadone Scn, Ur NEGATIVE (*)    Fentanyl  NEGATIVE (*)    All other components within normal limits  LIPASE, BLOOD - Abnormal; Notable for the following components:   Lipase 127 (*)    All other components within normal limits  URINALYSIS, ROUTINE W REFLEX MICROSCOPIC - Abnormal; Notable for the following components:   Color, Urine COLORLESS (*)     Glucose, UA >1,000 (*)    Ketones, ur 15 (*)    All other components within normal limits  TROPONIN T, HIGH SENSITIVITY - Abnormal; Notable for the following components:   Troponin T High Sensitivity 31 (*)    All other components within normal limits  TROPONIN T, HIGH SENSITIVITY - Abnormal; Notable for the following components:   Troponin T High Sensitivity 27 (*)    All other components within normal limits  ETHANOL  COMPREHENSIVE METABOLIC PANEL WITH GFR    EKG: EKG Interpretation Date/Time:  Friday September 21 2023 22:53:26 EDT Ventricular Rate:  91 PR Interval:  143 QRS Duration:  112 QT Interval:  421 QTC Calculation: 518 R Axis:   65  Text Interpretation: Sinus rhythm Left ventricular hypertrophy Abnormal T, consider ischemia, lateral leads Prolonged QT interval No significant change was found Confirmed by Carita Senior [phone]) on 09/21/2023 11:19:46 PM  Radiology: [facility] Chest Portable 1 View Result Date: 09/21/2023 EXAM: 1 VIEW(S) XRAY OF THE CHEST 09/21/2023 11:43:58 PM COMPARISON: 07/23/2023 CLINICAL HISTORY: Chest pain. Patient requesting detox from alcohol , last drink yesterday. Advises approximately a fifth per day for several years, denies history of DT, states she's been hospitalized because of the alcohol , but not withdrawal. Advises bad nausea, no PO intake today. FINDINGS: LUNGS AND PLEURA: Possible trace right pleural effusion. HEART AND MEDIASTINUM: Cardiomegaly, similar to prior. BONES AND SOFT TISSUES: Aortic atherosclerosis. IMPRESSION: 1. Cardiomegaly, similar to prior. 2. Possible trace right pleural effusion. Electronically signed by: Norman Gatlin MD 09/21/2023 11:49 PM EDT RP Workstation: [id number]     Procedures   Medications Ordered in the ED  LORazepam  (ATIVAN ) tablet 1-4 mg (has no administration in time range)    Or  LORazepam  (ATIVAN ) injection 1-4 mg (has no administration in time range)  thiamine  (VITAMIN B1) tablet 100 mg (has no  administration in time range)    Or  thiamine  (VITAMIN B1)  injection 100 mg (has no administration in time range)  folic acid  (FOLVITE ) tablet 1 mg (has no administration in time range)  multivitamin with minerals tablet 1 tablet (has no administration in time range)  LORazepam  (ATIVAN ) tablet 0-4 mg (2 mg Oral Given 09/21/23 2319)    Followed by  LORazepam  (ATIVAN ) tablet 0-4 mg (has no administration in time range)  lactated ringers  bolus 1,000 mL (has no administration in time range)  metoCLOPramide  (REGLAN ) injection 10 mg (has no administration in time range)                                    Medical Decision Making Amount and/or Complexity of Data Reviewed Labs: ordered. Decision-making details documented in ED Course. Radiology: ordered and independent interpretation performed. Decision-making details documented in ED Course. ECG/medicine tests: ordered and independent interpretation performed. Decision-making details documented in ED Course.  Risk OTC drugs. Prescription drug management. Decision regarding hospitalization.   Patient here with concern for alcohol  withdrawal.  She is mildly tremulous but has stable vital signs.  Since she has been waiting she developed central chest pain since about 8 PM.  EKG shows LVH with prolonged QT and nonspecific T wave inversions laterally.  Does have a history of dilated cardiomyopathy with EF 20-25%. Cardiac catheterization 07/02/2023 revealed angiographically normal epicardial coronary artery disease with minimal plaque, LVEDP of 16 mmHg.   Will give benzodiazepines per CIWA protocol.  Initial CIWA score is 12.  EKG shows no patient with some ST elevation in V2.  Will repeat.  Does endorse some chest discomfort.  Troponin 31.  Lipase 127  CT 09/13/23 1. Multiple complex cysts or complex cystic lesion in the central pancreatic head, in total measuring 5.6 x 3.3 x 3.2 cm. This is substantially increased in size and complexity when  compared to examination dated 2022, at which time there were small adjacent cystic lesions measuring 1.0 cm. Scattered coarse internal calcifications. No pancreatic ductal dilatation or surrounding acute inflammatory changes. Findings are most suggestive of pseudocystic change and chronic pancreatitis, however cystic pancreatic neoplasm is a substantial differential consideration. Given size and complexity, consider EUS/FNA for tissue diagnosis.  Concern for acute on chronic pancreatitis with alcohol  withdrawal. Troponins remain flat with low concern for ACS. CIWA 10 with nausea and dry heaving.   With ongoing alcohol  withdrawal and likely acute on chronic pancreatitis, will plan admission for hydration and close monitoring of alcohol  withdrawal.  D/w Dr. Marcene.     Final diagnoses:  Alcohol  withdrawal syndrome without complication (HCC)  Alcohol -induced acute pancreatitis without infection or necrosis    ED Discharge Orders     None          Anajah Sterbenz, Garnette, MD 09/22/23 [phone]

## 2023-09-21 NOTE — ED Triage Notes (Signed)
 Pt requesting detox from alcohol , last drink yesterday. Advises approx a fifth/day x several years, denies hx DT, states she's been hospitalized because of the alcohol , but not withdrawal. Advises bad nausea, no PO intake today.

## 2023-09-22 DIAGNOSIS — K852 Alcohol induced acute pancreatitis without necrosis or infection: Secondary | ICD-10-CM

## 2023-09-22 LAB — URINE DRUG SCREEN
Amphetamines: NEGATIVE — AB
Barbiturates: NEGATIVE — AB
Benzodiazepines: NEGATIVE — AB
Cocaine: NEGATIVE — AB
Fentanyl: NEGATIVE — AB
Methadone Scn, Ur: NEGATIVE — AB
Opiates: NEGATIVE — AB
Tetrahydrocannabinol: NEGATIVE — AB

## 2023-09-22 LAB — COMPREHENSIVE METABOLIC PANEL WITH GFR
ALT: 12 U/L (ref 0–44)
AST: 40 U/L (ref 15–41)
Albumin: 3.5 g/dL (ref 3.5–5.0)
Alkaline Phosphatase: 178 U/L — ABNORMAL HIGH (ref 38–126)
Anion gap: 16 — ABNORMAL HIGH (ref 5–15)
BUN: 6 mg/dL (ref 6–20)
CO2: 24 mmol/L (ref 22–32)
Calcium: 8.8 mg/dL — ABNORMAL LOW (ref 8.9–10.3)
Chloride: 96 mmol/L — ABNORMAL LOW (ref 98–111)
Creatinine, Ser: 0.57 mg/dL (ref 0.44–1.00)
GFR, Estimated: >60 mL/min (ref >60)
Glucose, Bld: 89 mg/dL (ref 70–99)
Potassium: 3.1 mmol/L — ABNORMAL LOW (ref 3.5–5.1)
Sodium: 136 mmol/L (ref 135–145)
Total Bilirubin: 1.2 mg/dL (ref 0.0–1.2)
Total Protein: 6.2 g/dL — ABNORMAL LOW (ref 6.5–8.1)

## 2023-09-22 LAB — URINALYSIS, ROUTINE W REFLEX MICROSCOPIC
Bacteria, UA: NONE SEEN
Bilirubin Urine: NEGATIVE
Glucose, UA: >1000 mg/dL — AB
Hgb urine dipstick: NEGATIVE
Ketones, ur: 15 mg/dL — AB
Leukocytes,Ua: NEGATIVE
Nitrite: NEGATIVE
Protein, ur: NEGATIVE mg/dL
Specific Gravity, Urine: 1.012 (ref 1.005–1.030)
pH: 6 (ref 5.0–8.0)

## 2023-09-22 LAB — TROPONIN T, HIGH SENSITIVITY
Troponin T High Sensitivity: 27 ng/L — ABNORMAL HIGH (ref 0–19)
Troponin T High Sensitivity: 31 ng/L — ABNORMAL HIGH (ref 0–19)

## 2023-09-22 LAB — MAGNESIUM: Magnesium: 2.1 mg/dL (ref 1.7–2.4)

## 2023-09-22 LAB — LIPASE, BLOOD: Lipase: 127 U/L — ABNORMAL HIGH (ref 11–51)

## 2023-09-22 MED ORDER — SODIUM CHLORIDE 0.9 % IV SOLN: INTRAVENOUS | Status: DC

## 2023-09-22 MED ORDER — IPRATROPIUM-ALBUTEROL 0.5-2.5 (3) MG/3ML IN SOLN: 3.0000 mL | Freq: Four times a day (QID) | RESPIRATORY_TRACT | Status: DC | PRN

## 2023-09-22 MED ORDER — POTASSIUM CHLORIDE CRYS ER 20 MEQ PO TBCR
40.0000 meq | EXTENDED_RELEASE_TABLET | ORAL | Status: AC
  Administered 2023-09-22 (×2): 40 meq via ORAL
  Filled 2023-09-22 (×2): qty 2

## 2023-09-22 MED ORDER — APIXABAN 5 MG PO TABS
5.0000 mg | ORAL_TABLET | Freq: Two times a day (BID) | ORAL | Status: DC
  Administered 2023-09-22 – 2023-09-24 (×5): 5 mg via ORAL
  Filled 2023-09-22 (×5): qty 1

## 2023-09-22 MED ORDER — OXYCODONE HCL 5 MG PO TABS
5.0000 mg | ORAL_TABLET | Freq: Four times a day (QID) | ORAL | Status: DC | PRN
  Administered 2023-09-22: 5 mg via ORAL
  Filled 2023-09-22: qty 1

## 2023-09-22 MED ORDER — TRIMETHOBENZAMIDE HCL 100 MG/ML IM SOLN
200.0000 mg | Freq: Once | INTRAMUSCULAR | Status: DC
  Filled 2023-09-22: qty 2

## 2023-09-22 MED ORDER — ONDANSETRON HCL 4 MG/2ML IJ SOLN
4.0000 mg | Freq: Four times a day (QID) | INTRAMUSCULAR | Status: DC | PRN
  Administered 2023-09-22: 4 mg via INTRAVENOUS
  Filled 2023-09-22: qty 2

## 2023-09-22 MED ORDER — INFLUENZA VIRUS VACC SPLIT PF (FLUZONE) 0.5 ML IM SUSY
0.5000 mL | PREFILLED_SYRINGE | INTRAMUSCULAR | Status: AC
  Administered 2023-09-23: 0.5 mL via INTRAMUSCULAR
  Filled 2023-09-22: qty 0.5

## 2023-09-22 MED ORDER — NICOTINE 14 MG/24HR TD PT24
14.0000 mg | MEDICATED_PATCH | Freq: Every day | TRANSDERMAL | Status: DC
  Administered 2023-09-22 – 2023-09-24 (×3): 14 mg via TRANSDERMAL
  Filled 2023-09-22 (×3): qty 1

## 2023-09-22 MED ORDER — BUDESON-GLYCOPYRROL-FORMOTEROL 160-9-4.8 MCG/ACT IN AERO
2.0000 | INHALATION_SPRAY | Freq: Two times a day (BID) | RESPIRATORY_TRACT | Status: DC
  Administered 2023-09-22 – 2023-09-24 (×3): 2 via RESPIRATORY_TRACT
  Filled 2023-09-22: qty 5.9

## 2023-09-22 MED ORDER — HYDROMORPHONE HCL 1 MG/ML IJ SOLN: 0.5000 mg | INTRAMUSCULAR | Status: DC | PRN

## 2023-09-22 NOTE — ED Notes (Signed)
 Report given to inpatient RN.

## 2023-09-22 NOTE — Progress Notes (Signed)
 Hospitalist Transfer Note:    Nursing staff, Please call [facility] Admits & Consults System-Wide number on Amion [phone]) as soon as patient's arrival, so appropriate admitting provider can evaluate the pt.  Transferring facility: [facility] Requesting provider: Dr. Carita (EDP at [facility]) Reason for transfer: admission for further evaluation and management of acute alcoholic pancreatitis.   68 F w/ h/o chronic systolic heart failure, ventricular thrombus anticoagulated on Eliquis , COPD, chronic alcohol  abuse, who presented to [facility] ED complaining of epigastric pain associated with nausea in the absence of any vomiting.  The patient has a documented history of chronic alcohol  abuse, acknowledges that at baseline she consumes alcohol  on a daily basis, noting that most recent alcohol  is consumed over 1 day ago.  She is noted to be mildly tremulous in the ED, without any overt seizures.  Started on CIWA protocol.  Labs were notable for lipase 127.  Liver enzymes are reported to be at baseline.  CBC shows no evidence of leukocytosis.  EKG is reported to show sinus rhythm without overt evidence of acute ischemic changes.  Imaging notable for chest x-ray, which was reported to show no evidence of acute cardiopulmonary process.  As she underwent CT abdomen/pelvis on 09/13/2023, updated abdominal imaging was not pursued this evening.  Subsequently, I accepted this patient for transfer for observation to a pcu bed at [facility] or [facility] (first available) for further work-up and management of the above.     Eva Pore, DO Hospitalist

## 2023-09-22 NOTE — H&P (Addendum)
 History and Physical    Suzanne Jackson FMW:[id number] DOB: 02-Nov-1970 DOA: 09/21/2023  PCP: Leonel Cole, MD   Patient coming from: Home    Chief Complaint: Abdominal pain  HPI: Suzanne Jackson is a 53 y.o. female with medical history significant of chronic systolic CHF suspected to be from alcoholic cardiomyopathy, ventricular thrombus on Eliquis  at home, COPD, chronic alcohol  abuse who presented from home with complaint of epigastric pain, withdrawal symptoms.  Abdominal pain was associated with nausea but no vomiting.  Has a documented history of chronic alcohol  abuse.  She she and her husband consume alcohol  daily.  Drinks about a pint of german liquor.  Last drink was yesterday.  On presentation she was anxious and tremulous in the emergency department.  Hemodynamically stable on presentation.  Lab work showed lipase of 127.  AST was 56, ALT of 18, potassium 3.2. Chest x-ray did not show any evidence of acute cardiopulmonary process.  CT abdomen/pelvis was not obtained on presentation.  Recent CT abdomen/pelvis done on 8/28 had shown multiple complex cyst in the head of pancreas, increase size in size from prior, no pancreatic duct dilatation or surrounding inflammatory changes.  Findings are stable pseudocystic changes, chronic pancreatitis. Patient admitted for the management of acute alcoholic pancreatitis.  Started on CIWA protocol.  Patient seen and examined at bedside today.  She was found to be alert and oriented.  Complains of abdominal discomfort.  No nausea or vomiting during my evaluation.  No history of fever, chills, chest pain, shortness of breath, cough, dysuria, hematochezia or melena.  She says she wants detoxification.  She also mentioned that her husband has also been admitted to this hospital. Patient follows with [facility] cardiology.   Review of Systems: As per HPI otherwise 10 point review of systems negative.    Past Medical History:  Diagnosis Date   Alcohol   dependence with uncomplicated withdrawal (HCC) 06/15/2020   Alcoholic pancreatitis    Allergies    Arthritis    BACK   Asthma    Depression    Frequency of urination    History of Clostridium difficile colitis 07/10/2022   History of kidney stones    Hypertension    Leg pain    Left   Marijuana abuse 06/15/2020   Microhematuria    Right ureteral stone    Tobacco dependence 06/15/2020   Wears contact lenses     Past Surgical History:  Procedure Laterality Date   ANTERIOR LAT LUMBAR FUSION N/A 08/29/2017   Procedure: LUMBAR FOUR-FIVE LATERAL INTERBODY FUSION WITH INSTRUMENTATION AND ALLOGRAFT;  Surgeon: Beuford Anes, MD;  Location: [facility];  Service: Orthopedics;  Laterality: N/A;   APPLICATION OF WOUND VAC Right 06/09/2022   Procedure: APPLICATION OF WOUND VAC;  Surgeon: Silva Juliene SAUNDERS, DPM;  Location: [facility];  Service: Podiatry;  Laterality: Right;   CYSTO/  URETEROSCOPIC STONE EXTRACTION  1998   CYSTOSCOPY WITH RETROGRADE PYELOGRAM, URETEROSCOPY AND STENT PLACEMENT Right 02/12/2015   Procedure: CYSTOSCOPY WITH RETROGRADE PYELOGRAM, URETEROSCOPY;  Surgeon: Arlena Gal, MD;  Location: [facility];  Service: Urology;  Laterality: Right;   DILITATION & CURRETTAGE/HYSTROSCOPY WITH NOVASURE ABLATION N/A 12/05/2017   Procedure: DILATATION & CURETTAGE/HYSTEROSCOPY WITH NOVASURE ABLATION;  Surgeon: Ozan, Jennifer, DO;  Location: [facility];  Service: Gynecology;  Laterality: N/A;   EXTRACORPOREAL SHOCK WAVE LITHOTRIPSY Right 08/08/2018   Procedure: EXTRACORPOREAL SHOCK WAVE LITHOTRIPSY (ESWL);  Surgeon: Renda Glance, MD;  Location: [facility];  Service: Urology;  Laterality: Right;  GRAFT APPLICATION Right 06/09/2022   Procedure: GRAFT APPLICATION;  Surgeon: Silva Juliene SAUNDERS, DPM;  Location: [facility];  Service: Podiatry;  Laterality: Right;   LAPAROSCOPIC CHOLECYSTECTOMY  1994   LAPAROSCOPIC TUBAL LIGATION Bilateral 05-06-2009   fulgeration   POSTERIOR  FUSION LUMBAR SPINE  10-28-2008   L5 - S1   STONE EXTRACTION WITH BASKET Right 02/12/2015   Procedure: STONE EXTRACTION WITH BASKET AND LASER LITHOTRIPSY;  Surgeon: Arlena Gal, MD;  Location: [facility];  Service: Urology;  Laterality: Right;   TUBAL LIGATION     WOUND DEBRIDEMENT Right 06/09/2022   Procedure: DEBRIDEMENT WOUND;  Surgeon: Silva Juliene SAUNDERS, DPM;  Location: [facility];  Service: Podiatry;  Laterality: Right;     reports that she has been smoking cigarettes and e-cigarettes. She started smoking about 38 years ago. She has a 58 pack-year smoking history. She has never used smokeless tobacco. She reports current alcohol  use of about 4.0 - 5.0 standard drinks of alcohol  per week. She reports that she does not currently use drugs after having used the following drugs: Marijuana.  Allergies  Allergen Reactions   Aspirin Anaphylaxis and Shortness Of Breath   Ibuprofen Anaphylaxis   Oxycodone -Acetaminophen  Itching    Pt states okay to take with hydroxyzine    Aleve [Naproxen] Other (See Comments)    Wheezing    Bupropion Other (See Comments)    Anger    Family History  Problem Relation Age of Onset   Pancreatitis Neg Hx      Prior to Admission medications   Medication Sig Start Date End Date Taking? Authorizing Provider  albuterol  (PROVENTIL ) (2.5 MG/3ML) 0.083% nebulizer solution Take 3 mLs by nebulization in the morning, at noon, in the evening, and at bedtime. 05/14/23   [provider]  albuterol  (VENTOLIN  HFA) 108 (90 Base) MCG/ACT inhaler Inhale 1 puff into the lungs every 4 (four) hours as needed for shortness of breath. 03/15/23   [provider]  amLODipine  (NORVASC ) 10 MG tablet Take 10 mg by mouth daily.  10/11/16   [provider]  budesonide -formoterol  (SYMBICORT ) 160-4.5 MCG/ACT inhaler Inhale 1 puff into the lungs 2 (two) times daily.    [provider]  chlordiazePOXIDE (LIBRIUM) 25 MG capsule Take 25 mg by  mouth 4 (four) times daily as needed for anxiety or withdrawal. 05/18/23   [provider]  clopidogrel (PLAVIX) 75 MG tablet Take 75 mg by mouth daily. 06/28/23   [provider]  Disulfiram  500 MG TABS Take 1 tablet (500 mg total) by mouth daily. 06/18/20   Vann, Jessica U, DO  DULoxetine  (CYMBALTA ) 60 MG capsule Take 60 mg by mouth daily. 09/18/19   [provider]  ELIQUIS  5 MG TABS tablet Take 5 mg by mouth 2 (two) times daily.    [provider]  FARXIGA 10 MG TABS tablet Take 10 mg by mouth daily.    [provider]  fexofenadine (ALLEGRA) 180 MG tablet Take 180 mg by mouth daily.    [provider]  furosemide  (LASIX ) 20 MG tablet Take 1 tablet (20 mg total) by mouth daily. 07/24/23   Barrett, Jamie N, PA-C  hydrochlorothiazide  (MICROZIDE ) 12.5 MG capsule Take 12.5 mg by mouth daily. 10/11/16   [provider]  HYDROcodone -acetaminophen  (NORCO) 7.5-325 MG tablet Take 1 tablet by mouth every 4 (four) hours as needed. 09/04/23   [provider]  hydrOXYzine  (ATARAX /VISTARIL ) 50 MG tablet Take 1 tablet (50 mg total) by mouth every 6 (  six) hours as needed (refractory itching). 08/31/17   McKenzie, Kayla J, PA-C  LORazepam  (ATIVAN ) 1 MG tablet Take 1 mg by mouth 2 (two) times daily as needed for anxiety. 07/30/23   [provider]  losartan (COZAAR) 25 MG tablet Take 25 mg by mouth daily. 08/09/23   [provider]  metoprolol succinate (TOPROL-XL) 50 MG 24 hr tablet Take 50 mg by mouth daily.    [provider]  montelukast  (SINGULAIR ) 10 MG tablet Take 10 mg by mouth every morning.  11/21/12   [provider]  mupirocin  ointment (BACTROBAN ) 2 % Apply 1 Application topically 2 (two) times daily. 01/18/22   Silva Juliene SAUNDERS, DPM  Potassium Chloride  ER 20 MEQ TBCR Take 1 tablet by mouth in the morning and at bedtime.    [provider]  potassium chloride  SA (K-DUR,KLOR-CON ) 20 MEQ tablet Take 20  mEq by mouth daily.    [provider]  potassium chloride  SA (KLOR-CON  M) 20 MEQ tablet Take 1 tablet (20 mEq total) by mouth 2 (two) times daily for 5 days. 09/09/23 09/14/23  Kommor, Madison, MD  rosuvastatin (CRESTOR) 5 MG tablet Take 5 mg by mouth at bedtime. 05/28/23   [provider]  TRELEGY ELLIPTA 100-62.5-25 MCG/ACT AEPB Take 1 puff by mouth daily.    [provider]    Physical Exam: Vitals:   09/22/23 0315 09/22/23 0346 09/22/23 0351 09/22/23 0810  BP: 131/78 132/76  126/77  Pulse: 78 82  84  Resp: 18 16  20   Temp: 97.8 F (36.6 C) 98.5 F (36.9 C)  98.3 F (36.8 C)  TempSrc:  Oral  Oral  SpO2: 95% 96%  92%  Weight:   53.2 kg   Height:   5' 1 (1.549 m)     Constitutional: Anxious , restless Vitals:   09/22/23 0315 09/22/23 0346 09/22/23 0351 09/22/23 0810  BP: 131/78 132/76  126/77  Pulse: 78 82  84  Resp: 18 16  20   Temp: 97.8 F (36.6 C) 98.5 F (36.9 C)  98.3 F (36.8 C)  TempSrc:  Oral  Oral  SpO2: 95% 96%  92%  Weight:   53.2 kg   Height:   5' 1 (1.549 m)    Eyes: PERRL, lids and conjunctivae normal ENMT: Mucous membranes are moist.  Neck: normal, supple, no masses, no thyromegaly Respiratory: clear to auscultation bilaterally, no wheezing, no crackles. Normal respiratory effort. No accessory muscle use.  Cardiovascular: Regular rate and rhythm, no murmurs / rubs / gallops. No extremity edema.  Abdomen: Epigastric tenderness, no masses palpated. No hepatosplenomegaly. Bowel sounds positive.  Musculoskeletal: no clubbing / cyanosis. No joint deformity upper and lower extremities.  Skin: no rashes, lesions, ulcers. No induration Neurologic: CN 2-12 grossly intact.  Strength 5/5 in all 4.  Psychiatric: Normal judgment and insight. Alert and oriented x 3. Normal mood.   Foley Catheter:None  Labs on Admission: I have personally reviewed following labs and imaging studies  CBC: Recent Labs  Lab 09/16/23 1455 09/21/23 1920   WBC 6.3 7.6  NEUTROABS 3.8  --   HGB 10.9* 10.7*  HCT 32.1* 31.7*  MCV 97.3 93.0  PLT 163 153   Basic Metabolic Panel: Recent Labs  Lab 09/16/23 1455 09/21/23 1920  NA 136 137  K 3.3* 3.2*  CL 95* 95*  CO2 27 24  GLUCOSE 92 128*  BUN 15 8  CREATININE 0.58 0.61  CALCIUM 8.5* 9.5   GFR: Estimated Creatinine Clearance:  60.7 mL/min (by C-G formula based on SCr of 0.61 mg/dL). Liver Function Tests: Recent Labs  Lab 09/16/23 1455 09/21/23 1920  AST 379* 56*  ALT 35 18  ALKPHOS 252* 221*  BILITOT 1.0 1.4*  PROT 6.7 7.3  ALBUMIN 3.3* 4.0   Recent Labs  Lab 09/16/23 1455 09/21/23 2333  LIPASE 73* 127*   No results for input(s): AMMONIA in the last 168 hours. Coagulation Profile: No results for input(s): INR, PROTIME in the last 168 hours. Cardiac Enzymes: No results for input(s): CKTOTAL, CKMB, CKMBINDEX, TROPONINI in the last 168 hours. BNP (last 3 results) No results for input(s): PROBNP in the last 8760 hours. HbA1C: No results for input(s): HGBA1C in the last 72 hours. CBG: No results for input(s): GLUCAP in the last 168 hours. Lipid Profile: No results for input(s): CHOL, HDL, LDLCALC, TRIG, CHOLHDL, LDLDIRECT in the last 72 hours. Thyroid Function Tests: No results for input(s): TSH, T4TOTAL, FREET4, T3FREE, THYROIDAB in the last 72 hours. Anemia Panel: No results for input(s): VITAMINB12, FOLATE, FERRITIN, TIBC, IRON, RETICCTPCT in the last 72 hours. Urine analysis:    Component Value Date/Time   COLORURINE COLORLESS (A) 09/21/2023 2030   APPEARANCEUR CLEAR 09/21/2023 2030   LABSPEC 1.012 09/21/2023 2030   PHURINE 6.0 09/21/2023 2030   GLUCOSEU >1,000 (A) 09/21/2023 2030   HGBUR NEGATIVE 09/21/2023 2030   BILIRUBINUR NEGATIVE 09/21/2023 2030   KETONESUR 15 (A) 09/21/2023 2030   PROTEINUR NEGATIVE 09/21/2023 2030   UROBILINOGEN 1.0 10/28/2008 1106   NITRITE NEGATIVE 09/21/2023 2030    LEUKOCYTESUR NEGATIVE 09/21/2023 2030    Radiological Exams on Admission: DG Chest Portable 1 View Result Date: 09/21/2023 EXAM: 1 VIEW(S) XRAY OF THE CHEST 09/21/2023 11:43:58 PM COMPARISON: 07/23/2023 CLINICAL HISTORY: Chest pain. Patient requesting detox from alcohol , last drink yesterday. Advises approximately a fifth per day for several years, denies history of DT, states she's been hospitalized because of the alcohol , but not withdrawal. Advises bad nausea, no PO intake today. FINDINGS: LUNGS AND PLEURA: Possible trace right pleural effusion. HEART AND MEDIASTINUM: Cardiomegaly, similar to prior. BONES AND SOFT TISSUES: Aortic atherosclerosis. IMPRESSION: 1. Cardiomegaly, similar to prior. 2. Possible trace right pleural effusion. Electronically signed by: Norman Gatlin MD 09/21/2023 11:49 PM EDT RP Workstation: [id number]     Assessment/Plan Principal Problem:   Acute pancreatitis   Acute alcoholic pancreatitis: Drinks alcohol  daily.  Heavy alcohol  use .she presented with epigastric pain, nausea.  Numerous presentations to the ED with same complaints.  Continue IV fluid.  Continue pain management, supportive care. will gradually advance the diet.  Chronic alcohol  abuse: Monitor with CIWA.  Continue thiamine  and folic acid .  She and her husband both during about a pint of liquor every day.  HFrEF: As per the report, her last EF is 20 to 25%.  Suspected dilated alcoholic cardiomyopathy.  She follows with [facility] cardi[facility].  Will check her new echocardiogram.  Ventricular thrombus: Takes Eliquis  at home.  Recent CT has shown cardiomegaly, left ventricular apex with aneurysm and apical thrombus.  Chronic pancreatic changes:  Recent CT abdomen/pelvis done on 8/28 had shown multiple complex cyst in the head of pancreas, increase size in size from prior, no pancreatic duct dilatation or surrounding inflammatory changes.  Findings are stable pseudocystic changes, chronic pancreatitis.  Given  cystic changes, pancreatic neoplasm not excluded.  We recommend EUS/FNA for tissue diagnosis as an outpatient.  No evidence of lymphadenopathy or metastatic disease in the abdomen or pelvis.  Hypertension: Currently blood pressure stable.  Home medications on hold  COPD/smoker: Currently not on exacerbation.  Continue bronchodilators as needed.  Postop pack a day.  Continue nicotine  patch  Hypokalemia: Currently being supplemented and monitored  Elevated troponin: Mild elevated troponin with flat trend.  Likely associated with her chronic CHF.  She was having some chest pressure earlier, not today  Hypokalemia: Being supplemented with potassium.  Will also check magnesium  level  Severity of Illness: The appropriate patient status for this patient is obs  DVT prophylaxis: Eliquis  Code Status: Full code Family Communication: None at the bedside Consults called: None     Ivonne Mustache MD [facility]  09/22/2023, 8:43 AM

## 2023-09-22 NOTE — Plan of Care (Signed)

## 2023-09-22 NOTE — Plan of Care (Signed)

## 2023-09-23 ENCOUNTER — Observation Stay (HOSPITAL_COMMUNITY)

## 2023-09-23 DIAGNOSIS — K860 Alcohol-induced chronic pancreatitis: Secondary | ICD-10-CM | POA: Diagnosis present

## 2023-09-23 DIAGNOSIS — R531 Weakness: Secondary | ICD-10-CM | POA: Diagnosis present

## 2023-09-23 DIAGNOSIS — R0789 Other chest pain: Secondary | ICD-10-CM | POA: Diagnosis present

## 2023-09-23 DIAGNOSIS — I110 Hypertensive heart disease with heart failure: Secondary | ICD-10-CM | POA: Diagnosis present

## 2023-09-23 DIAGNOSIS — Z7902 Long term (current) use of antithrombotics/antiplatelets: Secondary | ICD-10-CM | POA: Diagnosis not present

## 2023-09-23 DIAGNOSIS — Z23 Encounter for immunization: Secondary | ICD-10-CM | POA: Diagnosis not present

## 2023-09-23 DIAGNOSIS — Z885 Allergy status to narcotic agent status: Secondary | ICD-10-CM | POA: Diagnosis not present

## 2023-09-23 DIAGNOSIS — R7989 Other specified abnormal findings of blood chemistry: Secondary | ICD-10-CM | POA: Diagnosis present

## 2023-09-23 DIAGNOSIS — Z981 Arthrodesis status: Secondary | ICD-10-CM | POA: Diagnosis not present

## 2023-09-23 DIAGNOSIS — I513 Intracardiac thrombosis, not elsewhere classified: Secondary | ICD-10-CM | POA: Diagnosis present

## 2023-09-23 DIAGNOSIS — E876 Hypokalemia: Secondary | ICD-10-CM | POA: Diagnosis present

## 2023-09-23 DIAGNOSIS — Z7951 Long term (current) use of inhaled steroids: Secondary | ICD-10-CM | POA: Diagnosis not present

## 2023-09-23 DIAGNOSIS — K852 Alcohol induced acute pancreatitis without necrosis or infection: Secondary | ICD-10-CM | POA: Diagnosis present

## 2023-09-23 DIAGNOSIS — Z87442 Personal history of urinary calculi: Secondary | ICD-10-CM | POA: Diagnosis not present

## 2023-09-23 DIAGNOSIS — F17210 Nicotine dependence, cigarettes, uncomplicated: Secondary | ICD-10-CM | POA: Diagnosis present

## 2023-09-23 DIAGNOSIS — J449 Chronic obstructive pulmonary disease, unspecified: Secondary | ICD-10-CM | POA: Diagnosis present

## 2023-09-23 DIAGNOSIS — Z886 Allergy status to analgesic agent status: Secondary | ICD-10-CM | POA: Diagnosis not present

## 2023-09-23 DIAGNOSIS — I426 Alcoholic cardiomyopathy: Secondary | ICD-10-CM | POA: Diagnosis present

## 2023-09-23 DIAGNOSIS — Z9049 Acquired absence of other specified parts of digestive tract: Secondary | ICD-10-CM | POA: Diagnosis not present

## 2023-09-23 DIAGNOSIS — I5022 Chronic systolic (congestive) heart failure: Secondary | ICD-10-CM | POA: Diagnosis present

## 2023-09-23 DIAGNOSIS — R9431 Abnormal electrocardiogram [ECG] [EKG]: Secondary | ICD-10-CM | POA: Diagnosis not present

## 2023-09-23 DIAGNOSIS — Z79899 Other long term (current) drug therapy: Secondary | ICD-10-CM | POA: Diagnosis not present

## 2023-09-23 DIAGNOSIS — Z7901 Long term (current) use of anticoagulants: Secondary | ICD-10-CM | POA: Diagnosis not present

## 2023-09-23 DIAGNOSIS — Z888 Allergy status to other drugs, medicaments and biological substances status: Secondary | ICD-10-CM | POA: Diagnosis not present

## 2023-09-23 DIAGNOSIS — F10230 Alcohol dependence with withdrawal, uncomplicated: Secondary | ICD-10-CM | POA: Diagnosis present

## 2023-09-23 LAB — MAGNESIUM: Magnesium: 2.2 mg/dL (ref 1.7–2.4)

## 2023-09-23 LAB — ECHOCARDIOGRAM COMPLETE
AR max vel: 2 cm2
AV Area VTI: 2.32 cm2
AV Area mean vel: 2.14 cm2
AV Mean grad: 4 mmHg
AV Peak grad: 8.6 mmHg
Ao pk vel: 1.47 m/s
Area-P 1/2: 6.83 cm2
Calc EF: 21.4 %
Est EF: <20
Height: 61 in
MV M vel: 4.06 m/s
MV Peak grad: 66 mmHg
MV VTI: 2.69 cm2
P 1/2 time: 439 ms
S' Lateral: 5.7 cm
Single Plane A2C EF: 14.6 %
Single Plane A4C EF: 30.9 %
Weight: 1876.56 [oz_av]

## 2023-09-23 LAB — BASIC METABOLIC PANEL WITH GFR
Anion gap: 13 (ref 5–15)
BUN: <5 mg/dL — ABNORMAL LOW (ref 6–20)
CO2: 22 mmol/L (ref 22–32)
Calcium: 8.6 mg/dL — ABNORMAL LOW (ref 8.9–10.3)
Chloride: 102 mmol/L (ref 98–111)
Creatinine, Ser: 0.53 mg/dL (ref 0.44–1.00)
GFR, Estimated: >60 mL/min (ref >60)
Glucose, Bld: 81 mg/dL (ref 70–99)
Potassium: 4.6 mmol/L (ref 3.5–5.1)
Sodium: 136 mmol/L (ref 135–145)

## 2023-09-23 MED ORDER — PERFLUTREN LIPID MICROSPHERE
1.0000 mL | INTRAVENOUS | Status: AC | PRN
  Administered 2023-09-23: 3 mL via INTRAVENOUS

## 2023-09-23 NOTE — Evaluation (Signed)
 Physical Therapy Evaluation Patient Details Name: Suzanne Jackson MRN: [id number] DOB: 02/24/1970 Today's Date: 09/23/2023  History of Present Illness  53 yo admitted with acute pancreatitis, ETOH WD. Hx of CHF, ETOH abuse, ventricular thrombus, lumbar fusions, COPD, ETOH cardiomyopathy  Clinical Impression  On eval, pt was CGA for mobility. She walked ~250 feet around the unit. Intermittent light steadying assist provided a as needed, but primarily CGA. HR up to 126 bpm. Discussed d/c plan-pt plans to return home where she lives with family. She politely declines f/u PT after discharge. Will continue to follow pt during this hospital stay. Recommend ambulation with nursing and/or mobility team as able to increase activity.         If plan is discharge home, recommend the following: A little help with walking and/or transfers;A little help with bathing/dressing/bathroom;Assistance with cooking/housework;Assist for transportation;Help with stairs or ramp for entrance   Can travel by private vehicle        Equipment Recommendations None recommended by PT  Recommendations for Other Services       Functional Status Assessment Patient has had a recent decline in their functional status and demonstrates the ability to make significant improvements in function in a reasonable and predictable amount of time.     Precautions / Restrictions Precautions Precautions: Fall Restrictions Weight Bearing Restrictions Per Provider Order: No      Mobility  Bed Mobility Overal bed mobility: Modified Independent                  Transfers Overall transfer level: Needs assistance Equipment used: None Transfers: Sit to/from Stand Sit to Stand: Contact guard assist           General transfer comment: Increased time.    Ambulation/Gait Ambulation/Gait assistance: Contact guard assist Gait Distance (Feet): 250 Feet Assistive device: None Gait Pattern/deviations: Step-through  pattern, Decreased stride length       General Gait Details: Mostly CGA with intermittent light steadying. HR up to 126 bpm.  Stairs            Wheelchair Mobility     Tilt Bed    Modified Rankin (Stroke Patients Only)       Balance Overall balance assessment: Needs assistance   Sitting balance-Leahy Scale: Normal       Standing balance-Leahy Scale: Fair                               Pertinent Vitals/Pain Pain Assessment Pain Assessment: No/denies pain    Home Living Family/patient expects to be discharged to:: Private residence Living Arrangements: Spouse/significant other;Children Available Help at Discharge: Family Type of Home: House Home Access: Stairs to enter       Home Layout: One level Home Equipment: None      Prior Function Prior Level of Function : Independent/Modified Independent                     Extremity/Trunk Assessment   Upper Extremity Assessment Upper Extremity Assessment: Overall WFL for tasks assessed    Lower Extremity Assessment Lower Extremity Assessment: Generalized weakness    Cervical / Trunk Assessment Cervical / Trunk Assessment: Normal  Communication   Communication Communication: No apparent difficulties    Cognition Arousal: Alert Behavior During Therapy: Flat affect   PT - Cognitive impairments: No apparent impairments  Following commands: Intact       Cueing Cueing Techniques: Verbal cues     General Comments      Exercises     Assessment/Plan    PT Assessment Patient needs continued PT services  PT Problem List Decreased balance;Decreased mobility       PT Treatment Interventions Gait training;Functional mobility training;Therapeutic activities;Therapeutic exercise;DME instruction;Balance training;Patient/family education    PT Goals (Current goals can be found in the Care Plan section)  Acute Rehab PT Goals Patient Stated Goal:  home PT Goal Formulation: With patient Time For Goal Achievement: 10/07/23 Potential to Achieve Goals: Good    Frequency Min 3X/week     Co-evaluation               AM-PAC PT 6 Clicks Mobility  Outcome Measure Help needed turning from your back to your side while in a flat bed without using bedrails?: None Help needed moving from lying on your back to sitting on the side of a flat bed without using bedrails?: None Help needed moving to and from a bed to a chair (including a wheelchair)?: None Help needed standing up from a chair using your arms (e.g., wheelchair or bedside chair)?: None Help needed to walk in hospital room?: A Little Help needed climbing 3-5 steps with a railing? : A Little 6 Click Score: 22    End of Session Equipment Utilized During Treatment: Gait belt Activity Tolerance: Patient tolerated treatment well Patient left: in bed;with call bell/phone within reach;with bed alarm set   PT Visit Diagnosis: Unsteadiness on feet (R26.81)    Time: 8541-8488 PT Time Calculation (min) (ACUTE ONLY): 13 min   Charges:   PT Evaluation $PT Eval Low Complexity: 1 Low   PT General Charges $$ ACUTE PT VISIT: 1 Visit           Dannial SQUIBB, PT Acute Rehabilitation  Office: [phone]

## 2023-09-23 NOTE — Progress Notes (Signed)
 PROGRESS NOTE  Suzanne Jackson  FMW:[id number] DOB: 03/19/1970 DOA: 09/21/2023 PCP: Leonel Cole, MD   Brief Narrative:  Suzanne Jackson is a 53 y.o. female with medical history significant of chronic systolic CHF suspected to be from alcoholic cardiomyopathy, ventricular thrombus on Eliquis  at home, COPD, chronic alcohol  abuse who presented from home with complaint of epigastric pain, withdrawal symptoms.  Abdominal pain was associated with nausea but no vomiting.  Has a documented history of chronic alcohol  abuse.  She she and her husband consume alcohol  daily.  Drinks about a pint of [place] liquor. Hemodynamically stable on presentation.  Lab work showed lipase of 127.  AST was 56, ALT of 18, potassium 3.2. Patient admitted for the management of acute alcoholic pancreatitis. Started on CIWA protocol.  Started on IV fluids.  Abdominal  pain has improved today.  Diet advanced to soft.  Assessment & Plan:  Principal Problem:   Acute pancreatitis   Acute alcoholic pancreatitis: Drinks alcohol  daily.  Heavy alcohol  use .she presented with epigastric pain, nausea.  Numerous presentations to the ED with same complaints.  Continue gentle IV fluid.  Continue pain management, supportive care.  Abdomen pain is better today.  Diet advanced to soft   Chronic alcohol  abuse/acute alcohol  withdrawal: Monitor with CIWA.  Continue thiamine  and folic acid .  She and her husband both during about a pint of liquor every day.   HFrEF: As per the report, her last EF is 20 to 25%.  Suspected dilated alcoholic cardiomyopathy.  She follows with [facility].  Echo pending  Ventricular thrombus: Takes Eliquis  at home.  Recent CT has shown cardiomegaly, left ventricular apex with aneurysm and apical thrombus.   Chronic pancreatic changes:  Recent CT abdomen/pelvis done on 8/28 had shown multiple complex cyst in the head of pancreas, increase size in size from prior, no pancreatic duct dilatation or surrounding  inflammatory changes.  Findings are stable pseudocystic changes, chronic pancreatitis.  Given cystic changes, pancreatic neoplasm not excluded.  We recommend EUS/FNA for tissue diagnosis as an outpatient.  No evidence of lymphadenopathy or metastatic disease in the abdomen or pelvis.   Hypertension: Currently blood pressure stable.  Home medications on hold   COPD/smoker: Currently not on exacerbation.  Continue bronchodilators as needed.  Postop pack a day.  Continue nicotine  patch   Hypokalemia: Supplemented and corrected   Elevated troponin: Mild elevated troponin with flat trend.  Likely associated with her chronic CHF.  No chest pain  Deconditioning/weakness: Will consult PT           DVT prophylaxis: apixaban  (ELIQUIS ) tablet 5 mg     Code Status: Prior  Family Communication: None at the bedside  Patient status: Inpatient  Patient is from : Home  Anticipated discharge to: Home  Estimated DC date: 1 to 2 days   Consultants: None  Procedures: None  Antimicrobials:  Anti-infectives (From admission, onward)    None       Subjective: Patient seen and examined at bedside today.  Alert and oriented.  Appears weak and deconditioned.  Still with mild sinus tachycardia with a stable blood pressure.  She feels better today.  She denies any significant abdominal pain today.  She was to advance the diet to soft.  No nausea or vomiting today  Objective: Vitals:   09/23/23 0121 09/23/23 0200 09/23/23 0300 09/23/23 0558  BP:    111/69  Pulse: (!) 113   (!) 109  Resp:  18 19 20   Temp:  98.5 F (36.9 C)  TempSrc:    Oral  SpO2:    93%  Weight:      Height:        Intake/Output Summary (Last 24 hours) at 09/23/2023 1052 Last data filed at 09/23/2023 0701 Gross per 24 hour  Intake 1982.93 ml  Output --  Net 1982.93 ml   Filed Weights   09/22/23 0351  Weight: 53.2 kg    Examination:  General exam: Overall comfortable, not in distress, appears weak HEENT:  PERRL Respiratory system:  no wheezes or crackles  Cardiovascular system: Sinus tach.  Gastrointestinal system: Abdomen is nondistended, soft .  Mild tenderness in the epigastric region.  Good bowel sounds Central nervous system: Alert and oriented Extremities: No edema, no clubbing ,no cyanosis Skin: No rashes, no ulcers,no icterus     Data Reviewed: I have personally reviewed following labs and imaging studies  CBC: Recent Labs  Lab 09/16/23 1455 09/21/23 1920  WBC 6.3 7.6  NEUTROABS 3.8  --   HGB 10.9* 10.7*  HCT 32.1* 31.7*  MCV 97.3 93.0  PLT 163 153   Basic Metabolic Panel: Recent Labs  Lab 09/16/23 1455 09/21/23 1920 09/22/23 0920 09/22/23 1419 09/23/23 0359  NA 136 137 136  --  136  K 3.3* 3.2* 3.1*  --  4.6  CL 95* 95* 96*  --  102  CO2 27 24 24   --  22  GLUCOSE 92 128* 89  --  81  BUN 15 8 6   --  <5*  CREATININE 0.58 0.61 0.57  --  0.53  CALCIUM 8.5* 9.5 8.8*  --  8.6*  MG  --   --   --  2.1 2.2     Recent Results (from the past 240 hours)  Resp panel by RT-PCR (RSV, Flu A&B, Covid) Anterior Nasal Swab     Status: None   Collection Time: 09/16/23  2:55 PM   Specimen: Anterior Nasal Swab  Result Value Ref Range Status   SARS Coronavirus 2 by RT PCR NEGATIVE NEGATIVE Final    Comment: (NOTE) SARS-CoV-2 target nucleic acids are NOT DETECTED.  The SARS-CoV-2 RNA is generally detectable in upper respiratory specimens during the acute phase of infection. The lowest concentration of SARS-CoV-2 viral copies this assay can detect is 138 copies/mL. A negative result does not preclude SARS-Cov-2 infection and should not be used as the sole basis for treatment or other patient management decisions. A negative result may occur with  improper specimen collection/handling, submission of specimen other than nasopharyngeal swab, presence of viral mutation(s) within the areas targeted by this assay, and inadequate number of viral copies(<138 copies/mL). A negative  result must be combined with clinical observations, patient history, and epidemiological information. The expected result is Negative.  Fact Sheet for Patients:  BloggerCourse.com  Fact Sheet for Healthcare Providers:  SeriousBroker.it  This test is no t yet approved or cleared by the [place]  FDA and  has been authorized for detection and/or diagnosis of SARS-CoV-2 by FDA under an Emergency Use Authorization (EUA). This EUA will remain  in effect (meaning this test can be used) for the duration of the COVID-19 declaration under Section 564(b)(1) of the Act, 21 U.S.C.section 360bbb-3(b)(1), unless the authorization is terminated  or revoked sooner.       Influenza A by PCR NEGATIVE NEGATIVE Final   Influenza B by PCR NEGATIVE NEGATIVE Final    Comment: (NOTE) The Xpert Xpress SARS-CoV-2/FLU/RSV plus assay is intended as an  aid in the diagnosis of influenza from Nasopharyngeal swab specimens and should not be used as a sole basis for treatment. Nasal washings and aspirates are unacceptable for Xpert Xpress SARS-CoV-2/FLU/RSV testing.  Fact Sheet for Patients: BloggerCourse.com  Fact Sheet for Healthcare Providers: SeriousBroker.it  This test is not yet approved or cleared by the [place]  FDA and has been authorized for detection and/or diagnosis of SARS-CoV-2 by FDA under an Emergency Use Authorization (EUA). This EUA will remain in effect (meaning this test can be used) for the duration of the COVID-19 declaration under Section 564(b)(1) of the Act, 21 U.S.C. section 360bbb-3(b)(1), unless the authorization is terminated or revoked.     Resp Syncytial Virus by PCR NEGATIVE NEGATIVE Final    Comment: (NOTE) Fact Sheet for Patients: BloggerCourse.com  Fact Sheet for Healthcare Providers: SeriousBroker.it  This  test is not yet approved or cleared by the [place]  FDA and has been authorized for detection and/or diagnosis of SARS-CoV-2 by FDA under an Emergency Use Authorization (EUA). This EUA will remain in effect (meaning this test can be used) for the duration of the COVID-19 declaration under Section 564(b)(1) of the Act, 21 U.S.C. section 360bbb-3(b)(1), unless the authorization is terminated or revoked.  Performed at [facility], [address] [place], [place] [address]      Radiology Studies: DG Chest Portable 1 View Result Date: 09/21/2023 EXAM: 1 VIEW(S) XRAY OF THE CHEST 09/21/2023 11:43:58 PM COMPARISON: 07/23/2023 CLINICAL HISTORY: Chest pain. Patient requesting detox from alcohol , last drink yesterday. Advises approximately a fifth per day for several years, denies history of DT, states she's been hospitalized because of the alcohol , but not withdrawal. Advises bad nausea, no PO intake today. FINDINGS: LUNGS AND PLEURA: Possible trace right pleural effusion. HEART AND MEDIASTINUM: Cardiomegaly, similar to prior. BONES AND SOFT TISSUES: Aortic atherosclerosis. IMPRESSION: 1. Cardiomegaly, similar to prior. 2. Possible trace right pleural effusion. Electronically signed by: Norman Gatlin MD 09/21/2023 11:49 PM EDT RP Workstation: [id number]    Scheduled Meds:  apixaban   5 mg Oral BID   budesonide -glycopyrrolate -formoterol   2 puff Inhalation BID   folic acid   1 mg Oral Daily   influenza vac split trivalent PF  0.5 mL Intramuscular Tomorrow-1000   LORazepam   0-4 mg Oral Q6H   Followed by   LORazepam   0-4 mg Oral Q12H   multivitamin with minerals  1 tablet Oral Daily   nicotine   14 mg Transdermal Daily   thiamine   100 mg Oral Daily   Or   thiamine   100 mg Intravenous Daily   Continuous Infusions:  sodium chloride  75 mL/hr at 09/23/23 0701     LOS: 0 days   Ivonne Mustache, MD [facility] P9/07/2023, 10:52 AM

## 2023-09-23 NOTE — Plan of Care (Signed)

## 2023-09-24 ENCOUNTER — Other Ambulatory Visit (HOSPITAL_COMMUNITY): Payer: Self-pay

## 2023-09-24 DIAGNOSIS — K852 Alcohol induced acute pancreatitis without necrosis or infection: Secondary | ICD-10-CM | POA: Diagnosis not present

## 2023-09-24 MED ORDER — FOLIC ACID 1 MG PO TABS
1.0000 mg | ORAL_TABLET | Freq: Every day | ORAL | 0 refills | Status: AC
Start: 2023-09-24 — End: 2024-09-23
  Filled 2023-09-24: qty 60, 60d supply, fill #0

## 2023-09-24 MED ORDER — NICOTINE 14 MG/24HR TD PT24
14.0000 mg | MEDICATED_PATCH | Freq: Every day | TRANSDERMAL | 0 refills | Status: DC
  Filled 2023-09-24: qty 28, 28d supply, fill #0

## 2023-09-24 MED ORDER — THIAMINE MONONITRATE 100 MG PO TABS
100.0000 mg | ORAL_TABLET | Freq: Every day | ORAL | 0 refills | Status: AC
  Filled 2023-09-24: qty 60, 60d supply, fill #0

## 2023-09-24 NOTE — Plan of Care (Signed)
  Problem: Education: Goal: Knowledge of General Education information will improve Description: Including pain rating scale, medication(s)/side effects and non-pharmacologic comfort measures Outcome: Progressing   Problem: Health Behavior/Discharge Planning: Goal: Ability to manage health-related needs will improve Outcome: Progressing   Problem: Clinical Measurements: Goal: Ability to maintain clinical measurements within normal limits will improve Outcome: Progressing Goal: Will remain free from infection Outcome: Progressing Goal: Diagnostic test results will improve Outcome: Progressing Goal: Respiratory complications will improve Outcome: Progressing Goal: Cardiovascular complication will be avoided Outcome: Progressing   Problem: Nutrition: Goal: Adequate nutrition will be maintained Outcome: Progressing   Problem: Coping: Goal: Level of anxiety will decrease Outcome: Progressing   Problem: Activity: Goal: Risk for activity intolerance will decrease Outcome: Progressing

## 2023-09-24 NOTE — Progress Notes (Signed)
 Heart Failure Navigator Progress Note  Assessed for Heart & Vascular TOC clinic readiness.  Patient does not meet criteria due to patient is seen by [facility]. No HF TOC. .   Navigator will sign off at this time.,   Stephane Haddock, BSN, Scientist, clinical (histocompatibility and immunogenetics) Only

## 2023-09-24 NOTE — Discharge Summary (Signed)
 Physician Discharge Summary  Suzanne Jackson FMW:[id number] DOB: 06-19-1970 DOA: 09/21/2023  PCP: Leonel Cole, MD  Admit date: 09/21/2023 Discharge date: 09/24/2023  Admitted From: Home Disposition:  Home  Discharge Condition:Stable CODE STATUS:FULL Diet recommendation: Heart Healthy  Brief/Interim Summary: Suzanne Jackson is a 53 y.o. female with medical history significant of chronic systolic CHF suspected to be from alcoholic cardiomyopathy, ventricular thrombus on Eliquis  at home, COPD, chronic alcohol  abuse who presented from home with complaint of epigastric pain, withdrawal symptoms.  Abdominal pain was associated with nausea but no vomiting.  Has a documented history of chronic alcohol  abuse.  She she and her husband consume alcohol  daily.  Drinks about a pint of g[place] liquor. Hemodynamically stable on presentation.  Lab work showed lipase of 127.  AST was 56, ALT of 18, potassium 3.2. Patient admitted for the management of acute alcoholic pancreatitis. Started on CIWA protocol.  Started on IV fluids.  She has clinically improved.  Currently alert and oriented.  Not in withdrawal.  Tolerating solid diet.  She wants to go home today.  We have counseled her to quit alcohol .  We recommend her to follow-up with her cardiologist next week.  Following problems were addressed during the hospitalization:  Acute alcoholic pancreatitis: Drinks alcohol  daily.  Heavy alcohol  use .she presented with epigastric pain, nausea.  Numerous presentations to the ED with same complaints.  Treated with IV fluid.  Currently tolerating solid diet.  No abdomen pain, nausea or vomiting today.   Chronic alcohol  abuse/acute alcohol  withdrawal: Started on CIWA.  Continue thiamine  and folic acid .  She and her husband both during about a pint of liquor every day.  Counseled for cessation.  She does not want to participate with outpatient alcohol  rehabilitation   HFrEF: As per the report, her last EF is 20 to 25%.   Suspected dilated alcoholic cardiomyopathy.  She follows with [facility] cardiology.  Echo with EF of less than 20%.  She is euvolemic.  I have strongly recommend her to follow-up with her cardiologist next week,sent message to Dr Badal,cardiology   Ventricular thrombus: Takes Eliquis  at home.  Recent CT has shown cardiomegaly, left ventricular apex with aneurysm and apical thrombus.   Chronic pancreatic changes:  Recent CT abdomen/pelvis done on 8/28 had shown multiple complex cyst in the head of pancreas, increase size in size from prior, no pancreatic duct dilatation or surrounding inflammatory changes.  Findings are stable pseudocystic changes, chronic pancreatitis.  Given cystic changes, pancreatic neoplasm not excluded.  We recommend EUS/FNA for tissue diagnosis as an outpatient.  No evidence of lymphadenopathy or metastatic disease in the abdomen or pelvis.   Hypertension: Currently blood pressure stable.  Continue home meds   COPD/smoker: Currently not on exacerbation.  Continue bronchodilators as needed.  Smokes a pack a day.  Continue nicotine  patch   Hypokalemia: Supplemented and corrected   Elevated troponin: Mild elevated troponin with flat trend.  Likely associated with her chronic CHF.  No chest pain   Deconditioning/weakness:PT consulted, no follow-up recommended   Discharge Diagnoses:  Principal Problem:   Acute pancreatitis    Discharge Instructions  Discharge Instructions     Diet - low sodium heart healthy   Complete by: As directed    Discharge instructions   Complete by: As directed    1)Please quit alcohol ,smoking 2)Follow up with your cardiologist next week 3)Take your medications as instructed 4)Follow up with your PCP   Increase activity slowly   Complete by: As directed  Allergies as of 09/24/2023       Reactions   Aspirin Anaphylaxis, Shortness Of Breath   Ibuprofen Anaphylaxis   Oxycodone -acetaminophen  Itching, Other (See Comments)   Pt states  okay to take with hydroxyzine    Aleve [naproxen] Other (See Comments)   Wheezing    Bupropion Other (See Comments)   Anger        Medication List     STOP taking these medications    amLODipine  10 MG tablet Commonly known as: NORVASC    Disulfiram  500 MG Tabs       TAKE these medications    albuterol  108 (90 Base) MCG/ACT inhaler Commonly known as: VENTOLIN  HFA Inhale 2 puffs into the lungs 2 (two) times daily.   albuterol  (2.5 MG/3ML) 0.083% nebulizer solution Commonly known as: PROVENTIL  Take 3 mLs by nebulization every 6 (six) hours as needed for wheezing or shortness of breath.   DULoxetine  60 MG capsule Commonly known as: CYMBALTA  Take 60 mg by mouth daily.   Eliquis  5 MG Tabs tablet Generic drug: apixaban  Take 5 mg by mouth 2 (two) times daily.   Farxiga 10 MG Tabs tablet Generic drug: dapagliflozin propanediol Take 10 mg by mouth daily.   fexofenadine 180 MG tablet Commonly known as: ALLEGRA Take 180 mg by mouth daily.   folic acid  1 MG tablet Commonly known as: FOLVITE  Take 1 tablet (1 mg total) by mouth daily.   furosemide  20 MG tablet Commonly known as: LASIX  Take 1 tablet (20 mg total) by mouth daily. What changed: when to take this   HYDROcodone -acetaminophen  7.5-325 MG tablet Commonly known as: NORCO Take 1 tablet by mouth See admin instructions. Take 1 tablet by mouth every four to six hours as needed for pain   hydrOXYzine  50 MG tablet Commonly known as: ATARAX  Take 1 tablet (50 mg total) by mouth every 6 (six) hours as needed (refractory itching).   ipratropium-albuterol  0.5-2.5 (3) MG/3ML Soln Commonly known as: DUONEB Take 3 mLs by nebulization every 6 (six) hours as needed (for shortness of breath or wheezing).   LORazepam  1 MG tablet Commonly known as: ATIVAN  Take 1 mg by mouth 2 (two) times daily as needed for anxiety.   losartan 25 MG tablet Commonly known as: COZAAR Take 25 mg by mouth daily.   metoprolol succinate 50  MG 24 hr tablet Commonly known as: TOPROL-XL Take 50 mg by mouth daily.   montelukast  10 MG tablet Commonly known as: SINGULAIR  Take 10 mg by mouth at bedtime.   mupirocin  ointment 2 % Commonly known as: BACTROBAN  Apply 1 Application topically 2 (two) times daily.   nicotine  14 mg/24hr patch Commonly known as: NICODERM CQ  - dosed in mg/24 hours Place 1 patch (14 mg total) onto the skin daily.   pantoprazole  40 MG tablet Commonly known as: PROTONIX  Take 40 mg by mouth daily before breakfast.   potassium chloride  SA 20 MEQ tablet Commonly known as: KLOR-CON  M Take 20 mEq by mouth See admin instructions. Take 20 mEq by mouth in the morning and evening   rosuvastatin 5 MG tablet Commonly known as: CRESTOR Take 5 mg by mouth at bedtime.   thiamine  100 MG tablet Commonly known as: VITAMIN B1 Take 1 tablet (100 mg total) by mouth daily.   Trelegy Ellipta 100-62.5-25 MCG/ACT Aepb Generic drug: Fluticasone -Umeclidin-Vilant Take 1 puff by mouth daily.   Tylenol  8 Hour 650 MG CR tablet Generic drug: acetaminophen  Take 650-1,300 mg by mouth every 8 (eight) hours as needed for  pain.        Follow-up Information     Leonel Cole, MD. Schedule an appointment as soon as possible for a visit in 1 week(s).   Specialty: Family Medicine Contact information: [address] [place] [address] [phone]         Uvaldo Marcello POUR, MD. Schedule an appointment as soon as possible for a visit in 1 week(s).   Specialty: Cardiology Contact information: [address] [place] [address] [phone]                Allergies  Allergen Reactions   Aspirin Anaphylaxis and Shortness Of Breath   Ibuprofen Anaphylaxis   Oxycodone -Acetaminophen  Itching and Other (See Comments)    Pt states okay to take with hydroxyzine    Aleve [Naproxen] Other (See Comments)    Wheezing    Bupropion Other (See Comments)    Anger     Consultations: None   Procedures/Studies: ECHOCARDIOGRAM COMPLETE Result Date: 09/23/2023    ECHOCARDIOGRAM REPORT   Patient Name:   Suzanne Jackson Date of Exam: 09/23/2023 Medical Rec #:  [id number]         Height:       61.0 in Accession #:    [id number]        Weight:       117.3 lb Date of Birth:  November 07, 1970          BSA:          1.505 m Patient Age:    53 years          BP:           111/69 mmHg Patient Gender: F                 HR:           100 bpm. Exam Location:  Inpatient Procedure: 2D Echo, Cardiac Doppler, Color Doppler and Intracardiac            Opacification Agent (Both Spectral and Color Flow Doppler were            utilized during procedure). Indications:    Abnormal EKG R94.31  History:        Patient has no prior history of Echocardiogram examinations.                 Abnormal ECG; Risk Factors:Hypertension and Current Smoker.  Sonographer:    BERNARDA ROCKS Referring Phys: [id number] Dixie Jafri IMPRESSIONS  1. No LV thrombus by Defnity. Left ventricular ejection fraction, by estimation, is <20%. The left ventricle has severely decreased function. The left ventricle demonstrates global hypokinesis. The left ventricular internal cavity size was severely dilated. Left ventricular diastolic parameters are indeterminate.  2. Right ventricular systolic function is normal. The right ventricular size is normal. There is mildly elevated pulmonary artery systolic pressure. The estimated right ventricular systolic pressure is 38.8 mmHg.  3. Left atrial size was severely dilated.  4. The mitral valve is normal in structure. Mild mitral valve regurgitation. No evidence of mitral stenosis.  5. The aortic valve has an indeterminant number of cusps. Aortic valve regurgitation is mild. No aortic stenosis is present. Aortic regurgitation PHT measures 439 msec. Aortic valve mean gradient measures 4.0 mmHg.  6. The inferior vena cava is normal in size with greater than 50% respiratory variability,  suggesting right atrial pressure of 3 mmHg. Comparison(s): No prior Echocardiogram. FINDINGS  Left Ventricle: No LV thrombus by Defnity.  Left ventricular ejection fraction, by estimation, is <20%. The left ventricle has severely decreased function. The left ventricle demonstrates global hypokinesis. Definity  contrast agent was given IV to delineate the left ventricular endocardial borders. Strain was performed and the global longitudinal strain is indeterminate. The left ventricular internal cavity size was severely dilated. There is no left ventricular hypertrophy. Left ventricular diastolic parameters are indeterminate. Right Ventricle: The right ventricular size is normal. No increase in right ventricular wall thickness. Right ventricular systolic function is normal. There is mildly elevated pulmonary artery systolic pressure. The tricuspid regurgitant velocity is 2.99  m/s, and with an assumed right atrial pressure of 3 mmHg, the estimated right ventricular systolic pressure is 38.8 mmHg. Left Atrium: Left atrial size was severely dilated. Right Atrium: Right atrial size was normal in size. Pericardium: There is no evidence of pericardial effusion. Mitral Valve: The mitral valve is normal in structure. Mild mitral valve regurgitation. No evidence of mitral valve stenosis. MV peak gradient, 5.6 mmHg. The mean mitral valve gradient is 3.0 mmHg. Tricuspid Valve: The tricuspid valve is grossly normal. Tricuspid valve regurgitation is trivial. No evidence of tricuspid stenosis. Aortic Valve: The aortic valve has an indeterminant number of cusps. Aortic valve regurgitation is mild. Aortic regurgitation PHT measures 439 msec. No aortic stenosis is present. Aortic valve mean gradient measures 4.0 mmHg. Aortic valve peak gradient measures 8.6 mmHg. Aortic valve area, by VTI measures 2.32 cm. Pulmonic Valve: The pulmonic valve was normal in structure. Pulmonic valve regurgitation is trivial. No evidence of pulmonic  stenosis. Aorta: The aortic root and ascending aorta are structurally normal, with no evidence of dilitation. Venous: The inferior vena cava is normal in size with greater than 50% respiratory variability, suggesting right atrial pressure of 3 mmHg. IAS/Shunts: No atrial level shunt detected by color flow Doppler. Additional Comments: 3D was performed not requiring image post processing on an independent workstation and was indeterminate.  LEFT VENTRICLE PLAX 2D LVIDd:         6.70 cm      Diastology LVIDs:         5.70 cm      LV e' medial:    6.53 cm/s LV PW:         0.90 cm      LV E/e' medial:  18.8 LV IVS:        0.90 cm      LV e' lateral:   12.00 cm/s LVOT diam:     2.30 cm      LV E/e' lateral: 10.2 LV SV:         55 LV SV Index:   37 LVOT Area:     4.15 cm  LV Volumes (MOD) LV vol d, MOD A2C: 260.0 ml LV vol d, MOD A4C: 246.0 ml LV vol s, MOD A2C: 222.0 ml LV vol s, MOD A4C: 170.0 ml LV SV MOD A2C:     38.0 ml LV SV MOD A4C:     246.0 ml LV SV MOD BP:      56.9 ml RIGHT VENTRICLE             IVC RV Basal diam:  3.70 cm     IVC diam: 1.70 cm RV S prime:     17.30 cm/s TAPSE (M-mode): 2.4 cm LEFT ATRIUM              Index        RIGHT ATRIUM  Index LA diam:        4.60 cm  3.06 cm/m   RA Area:     13.70 cm LA Vol (A2C):   95.4 ml  63.37 ml/m  RA Volume:   30.80 ml  20.46 ml/m LA Vol (A4C):   102.0 ml 67.75 ml/m LA Biplane Vol: 98.9 ml  65.69 ml/m  AORTIC VALVE                    PULMONIC VALVE AV Area (Vmax):    2.00 cm     PV Vmax:          1.20 m/s AV Area (Vmean):   2.14 cm     PV Peak grad:     5.8 mmHg AV Area (VTI):     2.32 cm     PR End Diast Vel: 1.81 msec AV Vmax:           147.00 cm/s AV Vmean:          87.400 cm/s AV VTI:            0.238 m AV Peak Grad:      8.6 mmHg AV Mean Grad:      4.0 mmHg LVOT Vmax:         70.90 cm/s LVOT Vmean:        45.000 cm/s LVOT VTI:          0.133 m LVOT/AV VTI ratio: 0.56 AI PHT:            439 msec  AORTA Ao Root diam: 3.00 cm Ao Asc diam:   3.60 cm MITRAL VALVE                TRICUSPID VALVE MV Area (PHT): 6.83 cm     TR Peak grad:   35.8 mmHg MV Area VTI:   2.69 cm     TR Vmax:        299.00 cm/s MV Peak grad:  5.6 mmHg MV Mean grad:  3.0 mmHg     SHUNTS MV Vmax:       1.18 m/s     Systemic VTI:  0.13 m MV Vmean:      73.9 cm/s    Systemic Diam: 2.30 cm MV Decel Time: 111 msec MR Peak grad: 66.0 mmHg MR Vmax:      406.33 cm/s MV E velocity: 123.00 cm/s MV A velocity: 60.30 cm/s MV E/A ratio:  2.04 Vishnu Priya Mallipeddi Electronically signed by Diannah Late Mallipeddi Signature Date/Time: 09/23/2023/2:30:34 PM    Final    DG Chest Portable 1 View Result Date: 09/21/2023 EXAM: 1 VIEW(S) XRAY OF THE CHEST 09/21/2023 11:43:58 PM COMPARISON: 07/23/2023 CLINICAL HISTORY: Chest pain. Patient requesting detox from alcohol , last drink yesterday. Advises approximately a fifth per day for several years, denies history of DT, states she's been hospitalized because of the alcohol , but not withdrawal. Advises bad nausea, no PO intake today. FINDINGS: LUNGS AND PLEURA: Possible trace right pleural effusion. HEART AND MEDIASTINUM: Cardiomegaly, similar to prior. BONES AND SOFT TISSUES: Aortic atherosclerosis. IMPRESSION: 1. Cardiomegaly, similar to prior. 2. Possible trace right pleural effusion. Electronically signed by: Norman Gatlin MD 09/21/2023 11:49 PM EDT RP Workstation: [id number]   CT Head Wo Contrast Result Date: 09/16/2023 CLINICAL DATA:  Mental status change, unknown cause Neuro deficit, acute, stroke suspected bilateral blurry vision EXAM: CT HEAD WITHOUT CONTRAST TECHNIQUE: Contiguous axial images were obtained from the base of the skull through the vertex without  intravenous contrast. RADIATION DOSE REDUCTION: This exam was performed according to the departmental dose-optimization program which includes automated exposure control, adjustment of the mA and/or kV according to patient size and/or use of iterative reconstruction technique.  COMPARISON:  Head CT 1 week ago 09/09/2023 FINDINGS: Brain: No intracranial hemorrhage, mass effect, or midline shift. Brain volume is normal for age. No hydrocephalus. The basilar cisterns are patent. Periventricular and deep white matter hypodensity typical of chronic small vessel ischemia. Remote right cerebellar lacunar infarct, unchanged. No evidence of territorial infarct or acute ischemia. No extra-axial or intracranial fluid collection. Vascular: Atherosclerosis of skullbase vasculature without hyperdense vessel or abnormal calcification. Skull: No fracture or focal lesion. Sinuses/Orbits: Diffuse mucosal thickening, partially included in the field of view. This is unchanged from prior. Other: None. IMPRESSION: 1. No acute intracranial abnormality. 2. Chronic small vessel ischemia. Remote right cerebellar lacunar infarct. Electronically Signed   By: Andrea Gasman M.D.   On: 09/16/2023 15:44   CT ABDOMEN PELVIS W CONTRAST Result Date: 09/16/2023 CLINICAL DATA:  Elevated LFTs, rectal bleeding, history of uterine ablation * Tracking Code: BO * EXAM: CT ABDOMEN AND PELVIS WITH CONTRAST TECHNIQUE: Multidetector CT imaging of the abdomen and pelvis was performed using the standard protocol following bolus administration of intravenous contrast. RADIATION DOSE REDUCTION: This exam was performed according to the departmental dose-optimization program which includes automated exposure control, adjustment of the mA and/or kV according to patient size and/or use of iterative reconstruction technique. CONTRAST:  100mL ISOVUE -300 IOPAMIDOL  (ISOVUE -300) INJECTION 61% COMPARISON:  CT abdomen pelvis, 04/14/2020 FINDINGS: Lower chest: No acute abnormality. Cardiomegaly. Subendocardial scarring of the left ventricular apex with aneurysm and apical thrombus (series 2, image 5, series 5, image 98). Probable Hepatobiliary: No focal liver abnormality is seen. Focal fatty deposition adjacent to the falciform ligament,  characteristic in appearance and location, requiring no further follow-up or characterization. Status post cholecystectomy. No biliary dilatation. Pancreas: Multiple complex cysts or complex cystic lesion in the central pancreatic head, in total measuring 5.6 x 3.3 x 3.2 cm (series 5, image 72, series 2, image 20). This is substantially increased in size and complexity when compared to examination dated 2022, at which time there were small adjacent cystic lesions measuring 1.0 cm. Scattered coarse internal calcifications. No pancreatic ductal dilatation. No pancreatic ductal dilatation or surrounding inflammatory changes. Spleen: Normal in size without significant abnormality. Adrenals/Urinary Tract: Adrenal glands are unremarkable. Kidneys are normal, without renal calculi, solid lesion, or hydronephrosis. Bladder is unremarkable. Stomach/Bowel: Stomach is within normal limits. Appendix appears normal. No evidence of bowel wall thickening, distention, or inflammatory changes. Vascular/Lymphatic: Aortic atherosclerosis. No enlarged abdominal or pelvic lymph nodes. Reproductive: No mass or other significant abnormality. Other: No abdominal wall hernia or abnormality. No ascites. Musculoskeletal: No acute or significant osseous findings. IMPRESSION: 1. Multiple complex cysts or complex cystic lesion in the central pancreatic head, in total measuring 5.6 x 3.3 x 3.2 cm. This is substantially increased in size and complexity when compared to examination dated 2022, at which time there were small adjacent cystic lesions measuring 1.0 cm. Scattered coarse internal calcifications. No pancreatic ductal dilatation or surrounding acute inflammatory changes. Findings are most suggestive of pseudocystic change and chronic pancreatitis, however cystic pancreatic neoplasm is a substantial differential consideration. Given size and complexity, consider EUS/FNA for tissue diagnosis. 2. No evidence of lymphadenopathy or metastatic  disease in the abdomen or pelvis. 3. Cardiomegaly. 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 4. Status post cholecystectomy.  No biliary dilatation. Aortic Atherosclerosis (ICD10-I70.0). Electronically Signed   By: Marolyn JONETTA Jaksch M.D.   On: 09/16/2023 15:33   CT Head Wo Contrast Result Date: 09/09/2023 CLINICAL DATA:  Altered mental status.  Unresponsive patient. EXAM: CT HEAD WITHOUT CONTRAST TECHNIQUE: Contiguous axial images were obtained from the base of the skull through the vertex without intravenous contrast. RADIATION DOSE REDUCTION: This exam was performed according to the departmental dose-optimization program which includes automated exposure control, adjustment of the mA and/or kV according to patient size and/or use of iterative reconstruction technique. COMPARISON:  October 08, 2018 FINDINGS: Brain: There is generalized cerebral atrophy with widening of the extra-axial spaces and ventricular dilatation. There are areas of decreased attenuation within the white matter tracts of the supratentorial brain, consistent with microvascular disease changes. Vascular: No hyperdense vessel or unexpected calcification. Skull: Normal. Negative for fracture or focal lesion. Sinuses/Orbits: Moderate severity bilateral maxillary sinus mucosal thickening is seen. Marked severity bilateral ethmoid sinus, frontal sinus and sphenoid sinus mucosal thickening is also noted. Postoperative changes are suspected along the medial walls of the bilateral maxillary sinuses. Other: None. IMPRESSION: 1. Generalized cerebral atrophy and microvascular disease changes of the supratentorial brain. 2. No acute intracranial abnormality. 3. Marked severity paranasal disease. Electronically Signed   By: Suzen Dials M.D.   On: 09/09/2023 20:56      Subjective: Patient seen and examined at bedside today.  Hemodynamically stable comfortable.  No abdomen pain, nausea or vomiting.   Tolerating solid diet.  Wants to go home today.  Not in withdrawal   Discharge Exam: Vitals:   09/24/23 0400 09/24/23 0547  BP:  126/87  Pulse:  95  Resp: 16 18  Temp:  98.6 F (37 C)  SpO2:  97%   Vitals:   09/23/23 2121 09/24/23 0000 09/24/23 0400 09/24/23 0547  BP: (!) 116/91   126/87  Pulse: (!) 109   95  Resp: 18 19 16 18   Temp: 98.1 F (36.7 C)   98.6 F (37 C)  TempSrc: Oral   Oral  SpO2: 99%   97%  Weight:      Height:        General: Pt is alert, awake, not in acute distress Cardiovascular: RRR, S1/S2 +, no rubs, no gallops Respiratory: CTA bilaterally, no wheezing, no rhonchi Abdominal: Soft, NT, ND, bowel sounds + Extremities: no edema, no cyanosis    The results of significant diagnostics from this hospitalization (including imaging, microbiology, ancillary and laboratory) are listed below for reference.     Microbiology: Recent Results (from the past 240 hours)  Resp panel by RT-PCR (RSV, Flu A&B, Covid) Anterior Nasal Swab     Status: None   Collection Time: 09/16/23  2:55 PM   Specimen: Anterior Nasal Swab  Result Value Ref Range Status   SARS Coronavirus 2 by RT PCR NEGATIVE NEGATIVE Final    Comment: (NOTE) SARS-CoV-2 target nucleic acids are NOT DETECTED.  The SARS-CoV-2 RNA is generally detectable in upper respiratory specimens during the acute phase of infection. The lowest concentration of SARS-CoV-2 viral copies this assay can detect is 138 copies/mL. A negative result does not preclude SARS-Cov-2 infection and should not be used as the sole basis for treatment or other patient management decisions. A negative result may occur with  improper specimen collection/handling, submission of specimen other than nasopharyngeal swab, presence of viral mutation(s) within the areas targeted by this assay, and inadequate number of viral copies(<138 copies/mL). A negative result must be combined  with clinical observations, patient history, and  epidemiological information. The expected result is Negative.  Fact Sheet for Patients:  BloggerCourse.com  Fact Sheet for Healthcare Providers:  SeriousBroker.it  This test is no t yet approved or cleared by the [place]  FDA and  has been authorized for detection and/or diagnosis of SARS-CoV-2 by FDA under an Emergency Use Authorization (EUA). This EUA will remain  in effect (meaning this test can be used) for the duration of the COVID-19 declaration under Section 564(b)(1) of the Act, 21 U.S.C.section 360bbb-3(b)(1), unless the authorization is terminated  or revoked sooner.       Influenza A by PCR NEGATIVE NEGATIVE Final   Influenza B by PCR NEGATIVE NEGATIVE Final    Comment: (NOTE) The Xpert Xpress SARS-CoV-2/FLU/RSV plus assay is intended as an aid in the diagnosis of influenza from Nasopharyngeal swab specimens and should not be used as a sole basis for treatment. Nasal washings and aspirates are unacceptable for Xpert Xpress SARS-CoV-2/FLU/RSV testing.  Fact Sheet for Patients: BloggerCourse.com  Fact Sheet for Healthcare Providers: SeriousBroker.it  This test is not yet approved or cleared by the [place]  FDA and has been authorized for detection and/or diagnosis of SARS-CoV-2 by FDA under an Emergency Use Authorization (EUA). This EUA will remain in effect (meaning this test can be used) for the duration of the COVID-19 declaration under Section 564(b)(1) of the Act, 21 U.S.C. section 360bbb-3(b)(1), unless the authorization is terminated or revoked.     Resp Syncytial Virus by PCR NEGATIVE NEGATIVE Final    Comment: (NOTE) Fact Sheet for Patients: BloggerCourse.com  Fact Sheet for Healthcare Providers: SeriousBroker.it  This test is not yet approved or cleared by the [place]  FDA and has been  authorized for detection and/or diagnosis of SARS-CoV-2 by FDA under an Emergency Use Authorization (EUA). This EUA will remain in effect (meaning this test can be used) for the duration of the COVID-19 declaration under Section 564(b)(1) of the Act, 21 U.S.C. section 360bbb-3(b)(1), unless the authorization is terminated or revoked.  Performed at [facility], [address], [place], [place] [address]      Labs: BNP (last 3 results) Recent Labs    07/23/23 2324  BNP 2,032.5*   Basic Metabolic Panel: Recent Labs  Lab 09/21/23 1920 09/22/23 0920 09/22/23 1419 09/23/23 0359  NA 137 136  --  136  K 3.2* 3.1*  --  4.6  CL 95* 96*  --  102  CO2 24 24  --  22  GLUCOSE 128* 89  --  81  BUN 8 6  --  <5*  CREATININE 0.61 0.57  --  0.53  CALCIUM 9.5 8.8*  --  8.6*  MG  --   --  2.1 2.2   Liver Function Tests: Recent Labs  Lab 09/21/23 1920 09/22/23 0920  AST 56* 40  ALT 18 12  ALKPHOS 221* 178*  BILITOT 1.4* 1.2  PROT 7.3 6.2*  ALBUMIN 4.0 3.5   Recent Labs  Lab 09/21/23 2333  LIPASE 127*   No results for input(s): AMMONIA in the last 168 hours. CBC: Recent Labs  Lab 09/21/23 1920  WBC 7.6  HGB 10.7*  HCT 31.7*  MCV 93.0  PLT 153   Cardiac Enzymes: No results for input(s): CKTOTAL, CKMB, CKMBINDEX, TROPONINI in the last 168 hours. BNP: Invalid input(s): POCBNP CBG: No results for input(s): GLUCAP in the last 168 hours. D-Dimer No results for input(s): DDIMER in the last 72 hours. Hgb A1c No  results for input(s): HGBA1C in the last 72 hours. Lipid Profile No results for input(s): CHOL, HDL, LDLCALC, TRIG, CHOLHDL, LDLDIRECT in the last 72 hours. Thyroid function studies No results for input(s): TSH, T4TOTAL, T3FREE, THYROIDAB in the last 72 hours.  Invalid input(s): FREET3 Anemia work up No results for input(s): VITAMINB12, FOLATE, FERRITIN, TIBC, IRON, RETICCTPCT in the last 72  hours. Urinalysis    Component Value Date/Time   COLORURINE COLORLESS (A) 09/21/2023 2030   APPEARANCEUR CLEAR 09/21/2023 2030   LABSPEC 1.012 09/21/2023 2030   PHURINE 6.0 09/21/2023 2030   GLUCOSEU >1,000 (A) 09/21/2023 2030   HGBUR NEGATIVE 09/21/2023 2030   BILIRUBINUR NEGATIVE 09/21/2023 2030   KETONESUR 15 (A) 09/21/2023 2030   PROTEINUR NEGATIVE 09/21/2023 2030   UROBILINOGEN 1.0 10/28/2008 1106   NITRITE NEGATIVE 09/21/2023 2030   LEUKOCYTESUR NEGATIVE 09/21/2023 2030   Sepsis Labs Recent Labs  Lab 09/21/23 1920  WBC 7.6   Microbiology Recent Results (from the past 240 hours)  Resp panel by RT-PCR (RSV, Flu A&B, Covid) Anterior Nasal Swab     Status: None   Collection Time: 09/16/23  2:55 PM   Specimen: Anterior Nasal Swab  Result Value Ref Range Status   SARS Coronavirus 2 by RT PCR NEGATIVE NEGATIVE Final    Comment: (NOTE) SARS-CoV-2 target nucleic acids are NOT DETECTED.  The SARS-CoV-2 RNA is generally detectable in upper respiratory specimens during the acute phase of infection. The lowest concentration of SARS-CoV-2 viral copies this assay can detect is 138 copies/mL. A negative result does not preclude SARS-Cov-2 infection and should not be used as the sole basis for treatment or other patient management decisions. A negative result may occur with  improper specimen collection/handling, submission of specimen other than nasopharyngeal swab, presence of viral mutation(s) within the areas targeted by this assay, and inadequate number of viral copies(<138 copies/mL). A negative result must be combined with clinical observations, patient history, and epidemiological information. The expected result is Negative.  Fact Sheet for Patients:  BloggerCourse.com  Fact Sheet for Healthcare Providers:  SeriousBroker.it  This test is no t yet approved or cleared by the [place]  FDA and  has been authorized  for detection and/or diagnosis of SARS-CoV-2 by FDA under an Emergency Use Authorization (EUA). This EUA will remain  in effect (meaning this test can be used) for the duration of the COVID-19 declaration under Section 564(b)(1) of the Act, 21 U.S.C.section 360bbb-3(b)(1), unless the authorization is terminated  or revoked sooner.       Influenza A by PCR NEGATIVE NEGATIVE Final   Influenza B by PCR NEGATIVE NEGATIVE Final    Comment: (NOTE) The Xpert Xpress SARS-CoV-2/FLU/RSV plus assay is intended as an aid in the diagnosis of influenza from Nasopharyngeal swab specimens and should not be used as a sole basis for treatment. Nasal washings and aspirates are unacceptable for Xpert Xpress SARS-CoV-2/FLU/RSV testing.  Fact Sheet for Patients: BloggerCourse.com  Fact Sheet for Healthcare Providers: SeriousBroker.it  This test is not yet approved or cleared by the [place]  FDA and has been authorized for detection and/or diagnosis of SARS-CoV-2 by FDA under an Emergency Use Authorization (EUA). This EUA will remain in effect (meaning this test can be used) for the duration of the COVID-19 declaration under Section 564(b)(1) of the Act, 21 U.S.C. section 360bbb-3(b)(1), unless the authorization is terminated or revoked.     Resp Syncytial Virus by PCR NEGATIVE NEGATIVE Final    Comment: (NOTE) Fact Sheet for Patients: BloggerCourse.com  Fact Sheet for Healthcare Providers: SeriousBroker.it  This test is not yet approved or cleared by the [place]  FDA and has been authorized for detection and/or diagnosis of SARS-CoV-2 by FDA under an Emergency Use Authorization (EUA). This EUA will remain in effect (meaning this test can be used) for the duration of the COVID-19 declaration under Section 564(b)(1) of the Act, 21 U.S.C. section 360bbb-3(b)(1), unless the authorization is  terminated or revoked.  Performed at [facility], [address], [place], [place] [address]     Please note: You were cared for by a hospitalist during your hospital stay. Once you are discharged, your primary care physician will handle any further medical issues. Please note that NO REFILLS for any discharge medications will be authorized once you are discharged, as it is imperative that you return to your primary care physician (or establish a relationship with a primary care physician if you do not have one) for your post hospital discharge needs so that they can reassess your need for medications and monitor your lab values.    Time coordinating discharge: 40 minutes  SIGNED:   Ivonne Mustache, MD  [facility] 09/24/2023, 10:28 AM Pager [phone]  If 7PM-7AM, please contact night-coverage [email or web address] Password [email or web address]

## 2023-09-24 NOTE — TOC Transition Note (Signed)
 Transition of Care [facility]) - Discharge Note  Patient Details  Name: Suzanne Jackson MRN: [id number] Date of Birth: 05-23-1970  [facility] CM/SW Contact:  Duwaine GORMAN Aran, LCSW Phone Number: 09/24/2023, 11:35 AM  Clinical Narrative: Care management consulted for ETOH use resources. CSW spoke with patient regarding consult. Patient politely declined as she reported she is already reviewing resources at this time. Care management signing off.  Final next level of care: Home/Self Care Barriers to Discharge: No Barriers Identified  Patient Goals and CMS Choice Patient states their goals for this hospitalization and ongoing recovery are:: Home Choice offered to / list presented to : NA  Discharge Plan and Services Additional resources added to the After Visit Summary for            DME Arranged: N/A DME Agency: NA  Social Drivers of Health (SDOH) Interventions SDOH Screenings   Food Insecurity: No Food Insecurity (09/22/2023)  Housing: Low Risk  (09/22/2023)  Transportation Needs: No Transportation Needs (09/22/2023)  Utilities: Not At Risk (09/22/2023)  Financial Resource Strain: Low Risk  (08/07/2023)   Received from [facility]  Physical Activity: Inactive (08/07/2023)   Received from [facility]  Social Connections: Patient Declined (08/07/2023)   Received from [facility]  Stress: Stress Concern Present (08/07/2023)   Received from [facility]  Tobacco Use: High Risk (09/21/2023)   Readmission Risk Interventions     No data to display

## 2023-09-25 DIAGNOSIS — R457 State of emotional shock and stress, unspecified: Secondary | ICD-10-CM | POA: Diagnosis not present

## 2023-09-25 DIAGNOSIS — R0602 Shortness of breath: Secondary | ICD-10-CM | POA: Diagnosis not present

## 2023-09-25 DIAGNOSIS — R000 Tachycardia, unspecified: Secondary | ICD-10-CM | POA: Diagnosis not present

## 2023-10-02 ENCOUNTER — Encounter (HOSPITAL_COMMUNITY): Payer: Self-pay

## 2023-10-02 ENCOUNTER — Emergency Department (HOSPITAL_COMMUNITY)

## 2023-10-02 ENCOUNTER — Other Ambulatory Visit: Payer: Self-pay

## 2023-10-02 ENCOUNTER — Observation Stay (HOSPITAL_COMMUNITY)
Admission: EM | Admit: 2023-10-02 | Discharge: 2023-10-04 | Disposition: A | Discharge status: Home / Self Care | Attending: Family Medicine | Admitting: Family Medicine

## 2023-10-02 DIAGNOSIS — E785 Hyperlipidemia, unspecified: Secondary | ICD-10-CM | POA: Diagnosis not present

## 2023-10-02 DIAGNOSIS — R079 Chest pain, unspecified: Secondary | ICD-10-CM | POA: Diagnosis not present

## 2023-10-02 DIAGNOSIS — Z86718 Personal history of other venous thrombosis and embolism: Secondary | ICD-10-CM | POA: Diagnosis not present

## 2023-10-02 DIAGNOSIS — I517 Cardiomegaly: Secondary | ICD-10-CM | POA: Diagnosis not present

## 2023-10-02 DIAGNOSIS — K863 Pseudocyst of pancreas: Secondary | ICD-10-CM | POA: Insufficient documentation

## 2023-10-02 DIAGNOSIS — Z7982 Long term (current) use of aspirin: Secondary | ICD-10-CM | POA: Insufficient documentation

## 2023-10-02 DIAGNOSIS — D500 Iron deficiency anemia secondary to blood loss (chronic): Secondary | ICD-10-CM | POA: Diagnosis not present

## 2023-10-02 DIAGNOSIS — K861 Other chronic pancreatitis: Secondary | ICD-10-CM | POA: Diagnosis not present

## 2023-10-02 DIAGNOSIS — K219 Gastro-esophageal reflux disease without esophagitis: Secondary | ICD-10-CM | POA: Diagnosis not present

## 2023-10-02 DIAGNOSIS — J4489 Other specified chronic obstructive pulmonary disease: Secondary | ICD-10-CM | POA: Diagnosis not present

## 2023-10-02 DIAGNOSIS — F32A Depression, unspecified: Secondary | ICD-10-CM | POA: Diagnosis not present

## 2023-10-02 DIAGNOSIS — R1013 Epigastric pain: Secondary | ICD-10-CM | POA: Diagnosis not present

## 2023-10-02 DIAGNOSIS — I110 Hypertensive heart disease with heart failure: Secondary | ICD-10-CM | POA: Insufficient documentation

## 2023-10-02 DIAGNOSIS — I700 Atherosclerosis of aorta: Secondary | ICD-10-CM | POA: Diagnosis not present

## 2023-10-02 DIAGNOSIS — Z7901 Long term (current) use of anticoagulants: Secondary | ICD-10-CM | POA: Diagnosis not present

## 2023-10-02 DIAGNOSIS — R109 Unspecified abdominal pain: Secondary | ICD-10-CM | POA: Diagnosis not present

## 2023-10-02 DIAGNOSIS — I5022 Chronic systolic (congestive) heart failure: Secondary | ICD-10-CM | POA: Diagnosis not present

## 2023-10-02 DIAGNOSIS — F17290 Nicotine dependence, other tobacco product, uncomplicated: Secondary | ICD-10-CM | POA: Diagnosis not present

## 2023-10-02 DIAGNOSIS — R000 Tachycardia, unspecified: Secondary | ICD-10-CM | POA: Diagnosis not present

## 2023-10-02 DIAGNOSIS — D75839 Thrombocytosis, unspecified: Secondary | ICD-10-CM | POA: Insufficient documentation

## 2023-10-02 DIAGNOSIS — Z79899 Other long term (current) drug therapy: Secondary | ICD-10-CM | POA: Diagnosis not present

## 2023-10-02 DIAGNOSIS — K859 Acute pancreatitis without necrosis or infection, unspecified: Secondary | ICD-10-CM | POA: Diagnosis not present

## 2023-10-02 DIAGNOSIS — E871 Hypo-osmolality and hyponatremia: Secondary | ICD-10-CM | POA: Diagnosis not present

## 2023-10-02 DIAGNOSIS — J449 Chronic obstructive pulmonary disease, unspecified: Secondary | ICD-10-CM | POA: Diagnosis not present

## 2023-10-02 DIAGNOSIS — F1090 Alcohol use, unspecified, uncomplicated: Secondary | ICD-10-CM | POA: Insufficient documentation

## 2023-10-02 DIAGNOSIS — I4581 Long QT syndrome: Secondary | ICD-10-CM | POA: Diagnosis not present

## 2023-10-02 DIAGNOSIS — N200 Calculus of kidney: Secondary | ICD-10-CM | POA: Diagnosis not present

## 2023-10-02 LAB — COMPREHENSIVE METABOLIC PANEL WITH GFR
ALT: 12 U/L (ref 0–44)
AST: 22 U/L (ref 15–41)
Albumin: 3.5 g/dL (ref 3.5–5.0)
Alkaline Phosphatase: 82 U/L (ref 38–126)
Anion gap: 16 — ABNORMAL HIGH (ref 5–15)
BUN: 21 mg/dL — ABNORMAL HIGH (ref 6–20)
CO2: 21 mmol/L — ABNORMAL LOW (ref 22–32)
Calcium: 10.2 mg/dL (ref 8.9–10.3)
Chloride: 97 mmol/L — ABNORMAL LOW (ref 98–111)
Creatinine, Ser: 0.79 mg/dL (ref 0.44–1.00)
GFR, Estimated: >60 mL/min (ref >60)
Glucose, Bld: 89 mg/dL (ref 70–99)
Potassium: 3.9 mmol/L (ref 3.5–5.1)
Sodium: 134 mmol/L — ABNORMAL LOW (ref 135–145)
Total Bilirubin: 0.5 mg/dL (ref 0.0–1.2)
Total Protein: 7.6 g/dL (ref 6.5–8.1)

## 2023-10-02 LAB — CBC WITH DIFFERENTIAL/PLATELET
Abs Immature Granulocytes: 0.06 K/uL (ref 0.00–0.07)
Basophils Absolute: 0.1 K/uL (ref 0.0–0.1)
Basophils Relative: 1 %
Eosinophils Absolute: 0.2 K/uL (ref 0.0–0.5)
Eosinophils Relative: 1 %
HCT: 34.8 % — ABNORMAL LOW (ref 36.0–46.0)
Hemoglobin: 11 g/dL — ABNORMAL LOW (ref 12.0–15.0)
Immature Granulocytes: 0 %
Lymphocytes Relative: 12 %
Lymphs Abs: 1.6 K/uL (ref 0.7–4.0)
MCH: 31.1 pg (ref 26.0–34.0)
MCHC: 31.6 g/dL (ref 30.0–36.0)
MCV: 98.3 fL (ref 80.0–100.0)
Monocytes Absolute: 1.7 K/uL — ABNORMAL HIGH (ref 0.1–1.0)
Monocytes Relative: 12 %
Neutro Abs: 10.3 K/uL — ABNORMAL HIGH (ref 1.7–7.7)
Neutrophils Relative %: 74 %
Platelets: 627 K/uL — ABNORMAL HIGH (ref 150–400)
RBC: 3.54 MIL/uL — ABNORMAL LOW (ref 3.87–5.11)
RDW: 17 % — ABNORMAL HIGH (ref 11.5–15.5)
WBC: 14 K/uL — ABNORMAL HIGH (ref 4.0–10.5)
nRBC: 0 % (ref 0.0–0.2)

## 2023-10-02 LAB — URINALYSIS, ROUTINE W REFLEX MICROSCOPIC
Bilirubin Urine: NEGATIVE
Glucose, UA: >=500 mg/dL — AB
Hgb urine dipstick: NEGATIVE
Ketones, ur: 20 mg/dL — AB
Leukocytes,Ua: NEGATIVE
Nitrite: NEGATIVE
Protein, ur: NEGATIVE mg/dL
Specific Gravity, Urine: 1.024 (ref 1.005–1.030)
pH: 5 (ref 5.0–8.0)

## 2023-10-02 LAB — ETHANOL: Alcohol, Ethyl (B): <15 mg/dL (ref <15)

## 2023-10-02 LAB — LIPASE, BLOOD: Lipase: 180 U/L — ABNORMAL HIGH (ref 11–51)

## 2023-10-02 LAB — TROPONIN I (HIGH SENSITIVITY)
Troponin I (High Sensitivity): 14 ng/L (ref <18)
Troponin I (High Sensitivity): 12 ng/L (ref <18)

## 2023-10-02 MED ORDER — OXYCODONE-ACETAMINOPHEN 5-325 MG PO TABS
1.0000 | ORAL_TABLET | Freq: Once | ORAL | Status: AC
Start: 2023-10-02 — End: 2023-10-02
  Administered 2023-10-02: 1 via ORAL
  Filled 2023-10-02: qty 1

## 2023-10-02 MED ORDER — HYDROXYZINE HCL 25 MG PO TABS
25.0000 mg | ORAL_TABLET | Freq: Once | ORAL | Status: AC
  Administered 2023-10-02: 25 mg via ORAL
  Filled 2023-10-02: qty 1

## 2023-10-02 MED ORDER — LORAZEPAM 1 MG PO TABS: 0.0000 mg | ORAL_TABLET | Freq: Four times a day (QID) | ORAL | Status: DC

## 2023-10-02 MED ORDER — LORAZEPAM 2 MG/ML IJ SOLN: 0.0000 mg | Freq: Two times a day (BID) | INTRAMUSCULAR | Status: DC

## 2023-10-02 MED ORDER — LORAZEPAM 2 MG/ML IJ SOLN
0.0000 mg | Freq: Four times a day (QID) | INTRAMUSCULAR | Status: DC
  Administered 2023-10-03: 2 mg via INTRAVENOUS
  Filled 2023-10-02: qty 1

## 2023-10-02 MED ORDER — LACTATED RINGERS IV BOLUS
500.0000 mL | Freq: Once | INTRAVENOUS | Status: AC
  Administered 2023-10-02: 500 mL via INTRAVENOUS

## 2023-10-02 MED ORDER — LORAZEPAM 1 MG PO TABS: 0.0000 mg | ORAL_TABLET | Freq: Two times a day (BID) | ORAL | Status: DC

## 2023-10-02 MED ORDER — IOHEXOL 350 MG/ML SOLN
75.0000 mL | Freq: Once | INTRAVENOUS | Status: AC | PRN
  Administered 2023-10-02: 75 mL via INTRAVENOUS

## 2023-10-02 MED ORDER — THIAMINE MONONITRATE 100 MG PO TABS
100.0000 mg | ORAL_TABLET | Freq: Every day | ORAL | Status: DC
  Administered 2023-10-03 – 2023-10-04 (×2): 100 mg via ORAL
  Filled 2023-10-02 (×2): qty 1

## 2023-10-02 MED ORDER — HYDROMORPHONE HCL 1 MG/ML IJ SOLN
0.5000 mg | Freq: Once | INTRAMUSCULAR | Status: AC
  Administered 2023-10-02: 0.5 mg via INTRAVENOUS
  Filled 2023-10-02: qty 1

## 2023-10-02 MED ORDER — ONDANSETRON 4 MG PO TBDP
4.0000 mg | ORAL_TABLET | Freq: Once | ORAL | Status: AC
  Administered 2023-10-02: 4 mg via ORAL
  Filled 2023-10-02 (×2): qty 1

## 2023-10-02 MED ORDER — THIAMINE HCL 100 MG/ML IJ SOLN
100.0000 mg | Freq: Every day | INTRAMUSCULAR | Status: DC
  Filled 2023-10-02: qty 2

## 2023-10-02 MED ORDER — DIPHENHYDRAMINE HCL 25 MG PO CAPS
25.0000 mg | ORAL_CAPSULE | Freq: Once | ORAL | Status: AC
  Administered 2023-10-02: 25 mg via ORAL
  Filled 2023-10-02: qty 1

## 2023-10-02 NOTE — ED Provider Notes (Signed)
 [facility]-EMER[facility] DEPT [facility] Emergency Department Provider Note MRN:  [id number]  Arrival date & time: 10/02/23     Chief Complaint   Abdominal Pain   History of Present Illness   Suzanne Jackson is a 53 y.o. year-old female presents to the ED with chief complaint of epigastric abdominal pain.  She states that the pain radiates into her back and up into her chest.  She states that the pain started earlier today and has gradually worsened. She denies any fevers or chills.  She reports history of pancreatitis.  States that she has known pseudocyst and is supposed to follow-up with gastroenterology in 1 month.  She states that the pain became so severe that she had to come to the hospital for treatment today.  She states that she had some relief of pain after treatment in triage, but pain remains 7/10.  She hasn't been vomiting.  Hx of alcohol  abuse, but hasn't drank in 10 days.  History provided by patient.   Review of Systems  Pertinent positive and negative review of systems noted in HPI.    Physical Exam   Vitals:   10/02/23 1743 10/02/23 2240  BP: (!) 104/90 120/73  Pulse: (!) 101 89  Resp: 16 16  Temp: 98.7 F (37.1 C) 98.7 F (37.1 C)  SpO2: 97% 97%    CONSTITUTIONAL:  uncomfortable-appearing, NAD NEURO:  Alert and oriented x 3, CN 3-12 grossly intact EYES:  eyes equal and reactive ENT/NECK:  Supple, no stridor  CARDIO:  mildly tachcyardic, regular rhythm, appears well-perfused  PULM:  No respiratory distress, CTAB GI/GU:  non-distended,  MSK/SPINE:  No gross deformities, no edema, moves all extremities  SKIN:  no rash, atraumatic   *Additional and/or pertinent findings included in MDM below  Diagnostic and Interventional Summary    EKG Interpretation Date/Time:    Ventricular Rate:    PR Interval:    QRS Duration:    QT Interval:    QTC Calculation:   R Axis:      Text Interpretation:         Labs Reviewed  COMPREHENSIVE METABOLIC  PANEL WITH GFR - Abnormal; Notable for the following components:      Result Value   Sodium 134 (*)    Chloride 97 (*)    CO2 21 (*)    BUN 21 (*)    Anion gap 16 (*)    All other components within normal limits  LIPASE, BLOOD - Abnormal; Notable for the following components:   Lipase 180 (*)    All other components within normal limits  CBC WITH DIFFERENTIAL/PLATELET - Abnormal; Notable for the following components:   WBC 14.0 (*)    RBC 3.54 (*)    Hemoglobin 11.0 (*)    HCT 34.8 (*)    RDW 17.0 (*)    Platelets 627 (*)    Neutro Abs 10.3 (*)    Monocytes Absolute 1.7 (*)    All other components within normal limits  URINALYSIS, ROUTINE W REFLEX MICROSCOPIC - Abnormal; Notable for the following components:   APPearance HAZY (*)    Glucose, UA >=500 (*)    Ketones, ur 20 (*)    Bacteria, UA RARE (*)    All other components within normal limits  ETHANOL  TROPONIN I (HIGH SENSITIVITY)  TROPONIN I (HIGH SENSITIVITY)    CT ABDOMEN PELVIS W CONTRAST  Final Result    DG Chest 1 View  Final Result  Medications  lactated ringers  bolus 500 mL (has no administration in time range)  LORazepam  (ATIVAN ) injection 0-4 mg (has no administration in time range)    Or  LORazepam  (ATIVAN ) tablet 0-4 mg (has no administration in time range)  LORazepam  (ATIVAN ) injection 0-4 mg (has no administration in time range)    Or  LORazepam  (ATIVAN ) tablet 0-4 mg (has no administration in time range)  thiamine  (VITAMIN B1) tablet 100 mg (has no administration in time range)    Or  thiamine  (VITAMIN B1) injection 100 mg (has no administration in time range)  ondansetron  (ZOFRAN -ODT) disintegrating tablet 4 mg (4 mg Oral Given 10/02/23 1452)  oxyCODONE -acetaminophen  (PERCOCET/ROXICET) 5-325 MG per tablet 1 tablet (1 tablet Oral Given 10/02/23 2042)  diphenhydrAMINE  (BENADRYL ) capsule 25 mg (25 mg Oral Given 10/02/23 2042)  iohexol  (OMNIPAQUE ) 350 MG/ML injection 75 mL (75 mLs Intravenous  Contrast Given 10/02/23 2103)  HYDROmorphone  (DILAUDID ) injection 0.5 mg (0.5 mg Intravenous Given 10/02/23 2310)  hydrOXYzine  (ATARAX ) tablet 25 mg (25 mg Oral Given 10/02/23 2311)     Procedures  /  Critical Care Procedures  ED Course and Medical Decision Making  I have reviewed the triage vital signs, the nursing notes, and pertinent available records from the EMR.  Social Determinants Affecting Complexity of Care: Patient has no clinically significant social determinants affecting this chief complaint..   ED Course:    Medical Decision Making Patient here with epigastric pain.  She has history of pancreatitis and pancreatic pseudocyst.  She is scheduled to have an evaluation by gastroenterology, Dr. Burnette, in about 1 month.  She states over the past day or so she has had worsening epigastric pain.  She has not had vomiting.  Lipase is noted to be elevated at 180, this is increased from evaluation about 10 days ago.  She also has CT that shows enlargement of pseudocyst along with acute inflammation of the pancreas consistent with acute pancreatitis.  She is still having a decent amount of pain.  Will start gentle fluids due to poor ejection fraction and will consult hospitalist for admission.    Risk OTC drugs. Prescription drug management. Decision regarding hospitalization.         Consultants: I consulted with Hospitalist, Dr. Alfornia, who is appreciated for admitting. Requests that I start CIWA.     Treatment and Plan: Patient's exam and diagnostic results are concerning for pancreatitis.  Feel that patient will need admission to the hospital for further treatment and evaluation.    Final Clinical Impressions(s) / ED Diagnoses     ICD-10-CM   1. Acute pancreatitis, unspecified complication status, unspecified pancreatitis type  K85.90       ED Discharge Orders     None         Discharge Instructions Discussed with and Provided to Patient:    Discharge Instructions   None      Vicky Charleston, PA-C 10/02/23 2335    Jerrol Agent, MD 10/03/23 [phone]

## 2023-10-02 NOTE — ED Provider Triage Note (Signed)
 Emergency Medicine Provider Triage Evaluation Note  Kyonna Frier , a 53 y.o. female  was evaluated in triage.  Pt complains of days of epigastric pain radiating to chest.  Has history of chronic pancreatitis and alcohol  abuse.  Has not been drinking for 10 days.  Review of Systems  Positive: Nausea, abdominal pain Negative: Current chest pain  Physical Exam  BP (!) 122/52 (BP Location: Right Arm)   Pulse (!) 101   Temp 98.2 F (36.8 C) (Oral)   Resp 16   Ht 5' 1 (1.549 m)   Wt 52.6 kg   SpO2 100%   BMI 21.92 kg/m  Gen:   Awake, no distress   Resp:  Normal effort  MSK:   Moves extremities without difficulty  Other:  Significant tenderness to upper abdomen.  Medical Decision Making  Medically screening exam initiated at 2:48 PM.  Appropriate orders placed.  Jasa Dundon Jantz was informed that the remainder of the evaluation will be completed by another provider, this initial triage assessment does not replace that evaluation, and the importance of remaining in the ED until their evaluation is complete.     Shermon Warren SAILOR, PA-C 10/02/23 8550

## 2023-10-02 NOTE — ED Triage Notes (Signed)
 Pt is here for evaluation of abdominal pain which is in mid upper abdomen and radiates to her back.

## 2023-10-02 NOTE — Plan of Care (Incomplete)
 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 difficile colitis, nephrolithiasis, hypertension, marijuana and tobacco abuse, history of cholecystectomy.  Recent admission 9/5-9/8 for alcoholic pancreatitis.  Patient presents to the ED today for evaluation of epigastric abdominal pain.  Vital signs on arrival: Temperature 98.2 F, pulse 101, respiratory rate 16, blood pressure 122/52, and SpO2 100% on room air.  Labs notable for WBC count 14.0, hemoglobin 11.0 (stable), MCV 98.3, platelet count 627k (previously 153k on labs 11 days ago), sodium 134, chloride 97, bicarb 21, anion gap 16, glucose 89, BUN 21, creatinine 0.8, normal LFTs, lipase 180, UA not suggestive of infection, troponin negative x 2, ethanol level <15.  Chest x-ray showing no acute findings.  CT abdomen pelvis showing increasing size of multicystic process in the pancreatic head currently measuring 5.7 x 4.7 cm and previously was 3.2 x 3.3 cm.  There is mild adjacent soft tissue stranding and edema.  Findings are suspicious for pancreatic pseudocysts with superimposed acute inflammation/pancreatitis.  However, underlying cystic neoplasm is not excluded.  Patient was given Benadryl , Dilaudid , hydroxyzine , Zofran , Percocet, and 500 mL IV fluids.  Recent echo done 09/23/2023 showing EF <20%, left atrium severely dilated, mild mitral regurgitation, mild aortic regurgitation.  EKG: Sinus tachycardia, QTc 506, T wave inversions in lateral leads.  No significant change since previous tracing except heart rate and QT interval has increased.   QT prolongation   Chronic pancreatic changes:  Recent CT abdomen/pelvis done on 8/28 had shown multiple complex cyst in the head of pancreas, increase size in size from prior, no pancreatic duct dilatation or  surrounding inflammatory changes.  Findings are stable pseudocystic changes, chronic pancreatitis.  Given cystic changes, pancreatic neoplasm not excluded.  We recommend EUS/FNA for tissue diagnosis as an outpatient.  No evidence of lymphadenopathy or metastatic disease in the abdomen or pelvis.

## 2023-10-03 DIAGNOSIS — K859 Acute pancreatitis without necrosis or infection, unspecified: Secondary | ICD-10-CM

## 2023-10-03 DIAGNOSIS — I5022 Chronic systolic (congestive) heart failure: Secondary | ICD-10-CM

## 2023-10-03 DIAGNOSIS — K863 Pseudocyst of pancreas: Secondary | ICD-10-CM

## 2023-10-03 DIAGNOSIS — J449 Chronic obstructive pulmonary disease, unspecified: Secondary | ICD-10-CM

## 2023-10-03 DIAGNOSIS — K861 Other chronic pancreatitis: Secondary | ICD-10-CM

## 2023-10-03 LAB — BASIC METABOLIC PANEL WITH GFR
Anion gap: 16 — ABNORMAL HIGH (ref 5–15)
BUN: 13 mg/dL (ref 6–20)
CO2: 23 mmol/L (ref 22–32)
Calcium: 9.5 mg/dL (ref 8.9–10.3)
Chloride: 94 mmol/L — ABNORMAL LOW (ref 98–111)
Creatinine, Ser: 0.8 mg/dL (ref 0.44–1.00)
GFR, Estimated: >60 mL/min (ref >60)
Glucose, Bld: 98 mg/dL (ref 70–99)
Potassium: 3.9 mmol/L (ref 3.5–5.1)
Sodium: 133 mmol/L — ABNORMAL LOW (ref 135–145)

## 2023-10-03 LAB — CBC
HCT: 32.1 % — ABNORMAL LOW (ref 36.0–46.0)
Hemoglobin: 10.2 g/dL — ABNORMAL LOW (ref 12.0–15.0)
MCH: 31.2 pg (ref 26.0–34.0)
MCHC: 31.8 g/dL (ref 30.0–36.0)
MCV: 98.2 fL (ref 80.0–100.0)
Platelets: 555 K/uL — ABNORMAL HIGH (ref 150–400)
RBC: 3.27 MIL/uL — ABNORMAL LOW (ref 3.87–5.11)
RDW: 16.7 % — ABNORMAL HIGH (ref 11.5–15.5)
WBC: 10 K/uL (ref 4.0–10.5)
nRBC: 0 % (ref 0.0–0.2)

## 2023-10-03 LAB — HIV ANTIBODY (ROUTINE TESTING W REFLEX): HIV Screen 4th Generation wRfx: NONREACTIVE

## 2023-10-03 LAB — MAGNESIUM: Magnesium: 2.3 mg/dL (ref 1.7–2.4)

## 2023-10-03 LAB — LIPASE, BLOOD: Lipase: 123 U/L — ABNORMAL HIGH (ref 11–51)

## 2023-10-03 MED ORDER — MORPHINE SULFATE (PF) 2 MG/ML IV SOLN: 1.0000 mg | INTRAVENOUS | Status: DC | PRN

## 2023-10-03 MED ORDER — LORATADINE 10 MG PO TABS
10.0000 mg | ORAL_TABLET | Freq: Every day | ORAL | Status: DC
Start: 2023-10-03 — End: 2023-10-04
  Administered 2023-10-03 – 2023-10-04 (×2): 10 mg via ORAL
  Filled 2023-10-03 (×2): qty 1

## 2023-10-03 MED ORDER — ACETAMINOPHEN 325 MG PO TABS
650.0000 mg | ORAL_TABLET | Freq: Four times a day (QID) | ORAL | Status: DC | PRN
  Administered 2023-10-03: 650 mg via ORAL
  Filled 2023-10-03: qty 2

## 2023-10-03 MED ORDER — MONTELUKAST SODIUM 10 MG PO TABS
10.0000 mg | ORAL_TABLET | Freq: Every day | ORAL | Status: DC
  Administered 2023-10-03: 10 mg via ORAL
  Filled 2023-10-03: qty 1

## 2023-10-03 MED ORDER — IPRATROPIUM-ALBUTEROL 0.5-2.5 (3) MG/3ML IN SOLN: 3.0000 mL | Freq: Four times a day (QID) | RESPIRATORY_TRACT | Status: DC | PRN

## 2023-10-03 MED ORDER — SODIUM CHLORIDE 0.9 % IV SOLN: INTRAVENOUS | Status: AC

## 2023-10-03 MED ORDER — DULOXETINE HCL 60 MG PO CPEP
60.0000 mg | ORAL_CAPSULE | Freq: Every day | ORAL | Status: DC
  Administered 2023-10-03 – 2023-10-04 (×2): 60 mg via ORAL
  Filled 2023-10-03: qty 2
  Filled 2023-10-03: qty 1

## 2023-10-03 MED ORDER — ACETAMINOPHEN 650 MG RE SUPP: 650.0000 mg | Freq: Four times a day (QID) | RECTAL | Status: DC | PRN

## 2023-10-03 MED ORDER — OXYCODONE HCL 5 MG PO TABS: 5.0000 mg | ORAL_TABLET | Freq: Four times a day (QID) | ORAL | Status: DC | PRN

## 2023-10-03 MED ORDER — PANTOPRAZOLE SODIUM 40 MG PO TBEC
40.0000 mg | DELAYED_RELEASE_TABLET | Freq: Every day | ORAL | Status: DC
  Administered 2023-10-03 – 2023-10-04 (×2): 40 mg via ORAL
  Filled 2023-10-03 (×2): qty 1

## 2023-10-03 MED ORDER — DIPHENHYDRAMINE HCL 25 MG PO CAPS
25.0000 mg | ORAL_CAPSULE | Freq: Three times a day (TID) | ORAL | Status: DC | PRN
  Administered 2023-10-03: 25 mg via ORAL
  Filled 2023-10-03: qty 1

## 2023-10-03 MED ORDER — OXYCODONE HCL 5 MG PO TABS: 5.0000 mg | ORAL_TABLET | ORAL | Status: DC | PRN

## 2023-10-03 MED ORDER — HYDROXYZINE HCL 25 MG PO TABS: 25.0000 mg | ORAL_TABLET | Freq: Four times a day (QID) | ORAL | Status: DC | PRN

## 2023-10-03 MED ORDER — METOPROLOL SUCCINATE ER 25 MG PO TB24
50.0000 mg | ORAL_TABLET | Freq: Every day | ORAL | Status: DC
  Administered 2023-10-03 – 2023-10-04 (×2): 50 mg via ORAL
  Filled 2023-10-03 (×2): qty 2

## 2023-10-03 MED ORDER — MORPHINE SULFATE (PF) 2 MG/ML IV SOLN: 2.0000 mg | INTRAVENOUS | Status: DC | PRN

## 2023-10-03 MED ORDER — BUDESON-GLYCOPYRROL-FORMOTEROL 160-9-4.8 MCG/ACT IN AERO
2.0000 | INHALATION_SPRAY | Freq: Two times a day (BID) | RESPIRATORY_TRACT | Status: DC
  Administered 2023-10-03 – 2023-10-04 (×2): 2 via RESPIRATORY_TRACT
  Filled 2023-10-03: qty 5.9

## 2023-10-03 MED ORDER — NALOXONE HCL 0.4 MG/ML IJ SOLN: 0.4000 mg | INTRAMUSCULAR | Status: DC | PRN

## 2023-10-03 MED ORDER — OXYCODONE HCL 5 MG PO TABS
5.0000 mg | ORAL_TABLET | Freq: Four times a day (QID) | ORAL | Status: AC | PRN
  Administered 2023-10-03: 5 mg via ORAL
  Filled 2023-10-03: qty 1

## 2023-10-03 MED ORDER — ROSUVASTATIN CALCIUM 5 MG PO TABS
5.0000 mg | ORAL_TABLET | Freq: Every day | ORAL | Status: DC
  Administered 2023-10-03: 5 mg via ORAL
  Filled 2023-10-03: qty 1

## 2023-10-03 MED ORDER — APIXABAN 5 MG PO TABS
5.0000 mg | ORAL_TABLET | Freq: Two times a day (BID) | ORAL | Status: DC
  Administered 2023-10-03 – 2023-10-04 (×3): 5 mg via ORAL
  Filled 2023-10-03 (×3): qty 1

## 2023-10-03 MED ORDER — DIPHENHYDRAMINE HCL 50 MG/ML IJ SOLN
25.0000 mg | Freq: Once | INTRAMUSCULAR | Status: AC
  Administered 2023-10-03: 25 mg via INTRAVENOUS
  Filled 2023-10-03: qty 1

## 2023-10-03 NOTE — H&P (Signed)
 History and Physical    Suzanne Jackson FMW:[id number] DOB: March 24, 1970 DOA: 10/02/2023  PCP: Leonel Cole, MD  Patient coming from: Home  Chief Complaint: Abdominal pain  HPI: Suzanne Jackson is a 53 y.o. female with medical history significant of alcohol  use disorder, alcoholic pancreatitis, HFrEF (EF <20% per echo 09/23/2023) due to suspected dilated alcoholic cardiomyopathy, ventricular thrombus on Eliquis , asthma/COPD, arthritis, depression, history of C. difficile colitis, nephrolithiasis, hypertension, marijuana and tobacco abuse, history of cholecystectomy.  Recent admission 9/5-9/8 for alcoholic pancreatitis.  Patient presents to the ED for evaluation of epigastric abdominal pain.  Vital signs on arrival: Temperature 98.2 F, pulse 101, respiratory rate 16, blood pressure 122/52, and SpO2 100% on room air.  Labs notable for WBC count 14.0, hemoglobin 11.0 (stable), MCV 98.3, platelet count 627k (previously 153k on labs 11 days ago), sodium 134, chloride 97, bicarb 21, anion gap 16, glucose 89, BUN 21, creatinine 0.8, normal LFTs, lipase 180, UA not suggestive of infection, troponin negative x 2, ethanol level <15.  Chest x-ray showing no acute findings.  CT abdomen pelvis showing increasing size of multicystic process in the pancreatic head currently measuring 5.7 x 4.7 cm and previously was 3.2 x 3.3 cm.  There is mild adjacent soft tissue stranding and edema.  Findings are suspicious for pancreatic pseudocysts with superimposed acute inflammation/pancreatitis.  However, underlying cystic neoplasm is not excluded.  Patient was given Benadryl , Dilaudid , hydroxyzine , Zofran , Percocet, Ativan , and 500 mL IV fluids.  [facility] called to admit.  Patient states she has not consumed any more alcohol  since after her hospital discharge earlier this month.  Yesterday she started having severe epigastric abdominal pain radiating to her back.  Her pain has now improved after receiving medications in  the ED.  Denies nausea, vomiting, or diarrhea.  Denies fevers or chills.  Denies any pain in her chest or shortness of breath.  No other complaints.  Review of Systems:  Review of Systems  All other systems reviewed and are negative.   Past Medical History:  Diagnosis Date   Alcohol  dependence with uncomplicated withdrawal (HCC) 06/15/2020   Alcoholic pancreatitis    Allergies    Arthritis    BACK   Asthma    Depression    Frequency of urination    History of Clostridium difficile colitis 07/10/2022   History of kidney stones    Hypertension    Leg pain    Left   Marijuana abuse 06/15/2020   Microhematuria    Right ureteral stone    Tobacco dependence 06/15/2020   Wears contact lenses     Past Surgical History:  Procedure Laterality Date   ANTERIOR LAT LUMBAR FUSION N/A 08/29/2017   Procedure: LUMBAR FOUR-FIVE LATERAL INTERBODY FUSION WITH INSTRUMENTATION AND ALLOGRAFT;  Surgeon: Beuford Anes, MD;  Location: [facility];  Service: Orthopedics;  Laterality: N/A;   APPLICATION OF WOUND VAC Right 06/09/2022   Procedure: APPLICATION OF WOUND VAC;  Surgeon: Silva Juliene SAUNDERS, DPM;  Location: [facility];  Service: Podiatry;  Laterality: Right;   CYSTO/  URETEROSCOPIC STONE EXTRACTION  1998   CYSTOSCOPY WITH RETROGRADE PYELOGRAM, URETEROSCOPY AND STENT PLACEMENT Right 02/12/2015   Procedure: CYSTOSCOPY WITH RETROGRADE PYELOGRAM, URETEROSCOPY;  Surgeon: Arlena Gal, MD;  Location: [facility];  Service: Urology;  Laterality: Right;   DILITATION & CURRETTAGE/HYSTROSCOPY WITH NOVASURE ABLATION N/A 12/05/2017   Procedure: DILATATION & CURETTAGE/HYSTEROSCOPY WITH NOVASURE ABLATION;  Surgeon: Ozan, Jennifer, DO;  Location: [facility];  Service: Gynecology;  Laterality: N/A;   EXTRACORPOREAL SHOCK WAVE LITHOTRIPSY Right 08/08/2018   Procedure: EXTRACORPOREAL SHOCK WAVE LITHOTRIPSY (ESWL);  Surgeon: Renda Glance, MD;  Location: [facility];  Service: Urology;   Laterality: Right;   GRAFT APPLICATION Right 06/09/2022   Procedure: GRAFT APPLICATION;  Surgeon: Silva Juliene SAUNDERS, DPM;  Location: [facility];  Service: Podiatry;  Laterality: Right;   LAPAROSCOPIC CHOLECYSTECTOMY  1994   LAPAROSCOPIC TUBAL LIGATION Bilateral 05-06-2009   fulgeration   POSTERIOR FUSION LUMBAR SPINE  10-28-2008   L5 - S1   STONE EXTRACTION WITH BASKET Right 02/12/2015   Procedure: STONE EXTRACTION WITH BASKET AND LASER LITHOTRIPSY;  Surgeon: Arlena Gal, MD;  Location: [facility];  Service: Urology;  Laterality: Right;   TUBAL LIGATION     WOUND DEBRIDEMENT Right 06/09/2022   Procedure: DEBRIDEMENT WOUND;  Surgeon: Silva Juliene SAUNDERS, DPM;  Location: [facility];  Service: Podiatry;  Laterality: Right;     reports that she has been smoking cigarettes and e-cigarettes. She started smoking about 38 years ago. She has a 58.1 pack-year smoking history. She has never used smokeless tobacco. She reports current alcohol  use of about 4.0 - 5.0 standard drinks of alcohol  per week. She reports that she does not currently use drugs after having used the following drugs: Marijuana.  Allergies  Allergen Reactions   Aleve [Naproxen] Anaphylaxis        Aspirin Anaphylaxis and Shortness Of Breath   Ibuprofen Anaphylaxis   Oxycodone -Acetaminophen  Itching and Other (See Comments)    Pt states okay to take with hydroxyzine    Bupropion Other (See Comments)    Anger    Family History  Problem Relation Age of Onset   Pancreatitis Neg Hx     Prior to Admission medications   Medication Sig Start Date End Date Taking? Authorizing Provider  albuterol  (PROVENTIL ) (2.5 MG/3ML) 0.083% nebulizer solution Take 3 mLs by nebulization every 6 (six) hours as needed for wheezing or shortness of breath. 05/14/23  Yes [provider]  albuterol  (VENTOLIN  HFA) 108 (90 Base) MCG/ACT inhaler Inhale 2 puffs into the lungs 2 (two) times daily. 03/15/23  Yes [provider]   DULoxetine  (CYMBALTA ) 60 MG capsule Take 60 mg by mouth daily. 09/18/19  Yes [provider]  ELIQUIS  5 MG TABS tablet Take 5 mg by mouth 2 (two) times daily.   Yes [provider]  FARXIGA 10 MG TABS tablet Take 10 mg by mouth daily.   Yes [provider]  fexofenadine (ALLEGRA) 180 MG tablet Take 180 mg by mouth daily.   Yes [provider]  folic acid  (FOLVITE ) 1 MG tablet Take 1 tablet (1 mg total) by mouth daily. 09/24/23 09/23/24 Yes Jillian Buttery, MD  furosemide  (LASIX ) 20 MG tablet Take 1 tablet (20 mg total) by mouth daily. Patient taking differently: Take 20 mg by mouth every other day. 07/24/23  Yes Barrett, Warren SAILOR, PA-C  HYDROcodone -acetaminophen  (NORCO) 7.5-325 MG tablet Take 1 tablet by mouth See admin instructions. Take 1 tablet by mouth every four to six hours as needed for pain 09/04/23  Yes [provider]  hydrOXYzine  (ATARAX /VISTARIL ) 50 MG tablet Take 1 tablet (50 mg total) by mouth every 6 (six) hours as needed (refractory itching). 08/31/17  Yes McKenzie, Kayla J, PA-C  ipratropium-albuterol  (DUONEB) 0.5-2.5 (3) MG/3ML SOLN Take 3 mLs by nebulization every 6 (six) hours as needed (for shortness of breath or wheezing).   Yes [provider]  losartan (COZAAR) 25 MG tablet Take  25 mg by mouth daily. 08/09/23  Yes [provider]  metoprolol  succinate (TOPROL -XL) 50 MG 24 hr tablet Take 50 mg by mouth daily.   Yes [provider]  montelukast  (SINGULAIR ) 10 MG tablet Take 10 mg by mouth at bedtime. 11/21/12  Yes [provider]  pantoprazole  (PROTONIX ) 40 MG tablet Take 40 mg by mouth daily before breakfast.   Yes [provider]  potassium chloride  SA (K-DUR,KLOR-CON ) 20 MEQ tablet Take 20 mEq by mouth See admin instructions. Take 20 mEq by mouth in the morning and evening   Yes [provider]  rosuvastatin  (CRESTOR ) 5 MG tablet Take 5 mg by mouth at bedtime. 05/28/23  Yes [provider]  thiamine  (VITAMIN B1) 100 MG tablet Take 1 tablet (100 mg total) by mouth daily. 09/24/23  Yes Adhikari, Ivonne, MD  TRELEGY ELLIPTA 100-62.5-25 MCG/ACT AEPB Take 1 puff by mouth daily.   Yes [provider]  TYLENOL  8 HOUR 650 MG CR tablet Take 650-1,300 mg by mouth every 8 (eight) hours as needed for pain.   Yes [provider]  Vitamin D, Ergocalciferol, (DRISDOL) 1.25 MG (50000 UNIT) CAPS capsule Take 50,000 Units by mouth once a week. 09/19/23  Yes [provider]  nicotine  (NICODERM CQ  - DOSED IN MG/24 HOURS) 14 mg/24hr patch Place 1 patch (14 mg total) onto the skin daily. Patient not taking: Reported on 10/03/2023 09/24/23   Jillian Ivonne, MD    Physical Exam: Vitals:   10/02/23 1422 10/02/23 1743 10/02/23 2240 10/03/23 0217  BP:  (!) 104/90 120/73 118/76  Pulse:  (!) 101 89 95  Resp:  16 16 16   Temp:  98.7 F (37.1 C) 98.7 F (37.1 C) 98.2 F (36.8 C)  TempSrc:   Oral Oral  SpO2:  97% 97% 95%  Weight: 52.6 kg     Height: 5' 1 (1.549 m)       Physical Exam Vitals reviewed.  Constitutional:      General: She is not in acute distress. HENT:     Head: Normocephalic and atraumatic.  Eyes:     Extraocular Movements: Extraocular movements intact.  Cardiovascular:     Rate and Rhythm: Normal rate and regular rhythm.     Pulses: Normal pulses.  Pulmonary:     Effort: Pulmonary effort is normal. No respiratory distress.     Breath sounds: Normal breath sounds. No wheezing or rales.  Abdominal:     General: Bowel sounds are normal. There is no distension.     Palpations: Abdomen is soft.     Tenderness: There is no abdominal tenderness. There is no guarding.  Musculoskeletal:     Cervical back: Normal range of motion.     Right lower leg: No edema.     Left lower leg: No edema.  Skin:    General: Skin is warm and dry.  Neurological:     General: No focal deficit present.     Mental Status: She is alert and oriented to person,  place, and time.     Labs on Admission: I have personally reviewed following labs and imaging studies  CBC: Recent Labs  Lab 10/02/23 1535  WBC 14.0*  NEUTROABS 10.3*  HGB 11.0*  HCT 34.8*  MCV 98.3  PLT 627*   Basic Metabolic Panel: Recent Labs  Lab 10/02/23 1535  NA 134*  K 3.9  CL 97*  CO2 21*  GLUCOSE 89  BUN 21*  CREATININE 0.79  CALCIUM  10.2  GFR: Estimated Creatinine Clearance: 60.7 mL/min (by C-G formula based on SCr of 0.79 mg/dL). Liver Function Tests: Recent Labs  Lab 10/02/23 1535  AST 22  ALT 12  ALKPHOS 82  BILITOT 0.5  PROT 7.6  ALBUMIN 3.5   Recent Labs  Lab 10/02/23 1535  LIPASE 180*   No results for input(s): AMMONIA in the last 168 hours. Coagulation Profile: No results for input(s): INR, PROTIME in the last 168 hours. Cardiac Enzymes: No results for input(s): CKTOTAL, CKMB, CKMBINDEX, TROPONINI in the last 168 hours. BNP (last 3 results) No results for input(s): PROBNP in the last 8760 hours. HbA1C: No results for input(s): HGBA1C in the last 72 hours. CBG: No results for input(s): GLUCAP in the last 168 hours. Lipid Profile: No results for input(s): CHOL, HDL, LDLCALC, TRIG, CHOLHDL, LDLDIRECT in the last 72 hours. Thyroid Function Tests: No results for input(s): TSH, T4TOTAL, FREET4, T3FREE, THYROIDAB in the last 72 hours. Anemia Panel: No results for input(s): VITAMINB12, FOLATE, FERRITIN, TIBC, IRON, RETICCTPCT in the last 72 hours. Urine analysis:    Component Value Date/Time   COLORURINE YELLOW 10/02/2023 1522   APPEARANCEUR HAZY (A) 10/02/2023 1522   LABSPEC 1.024 10/02/2023 1522   PHURINE 5.0 10/02/2023 1522   GLUCOSEU >=500 (A) 10/02/2023 1522   HGBUR NEGATIVE 10/02/2023 1522   BILIRUBINUR NEGATIVE 10/02/2023 1522   KETONESUR 20 (A) 10/02/2023 1522   PROTEINUR NEGATIVE 10/02/2023 1522   UROBILINOGEN 1.0 10/28/2008 1106   NITRITE NEGATIVE 10/02/2023 1522    LEUKOCYTESUR NEGATIVE 10/02/2023 1522    Radiological Exams on Admission: CT ABDOMEN PELVIS W CONTRAST Result Date: 10/02/2023 CLINICAL DATA:  Acute abdominal pain. EXAM: CT ABDOMEN AND PELVIS WITH CONTRAST TECHNIQUE: Multidetector CT imaging of the abdomen and pelvis was performed using the standard protocol following bolus administration of intravenous contrast. RADIATION DOSE REDUCTION: This exam was performed according to the departmental dose-optimization program which includes automated exposure control, adjustment of the mA and/or kV according to patient size and/or use of iterative reconstruction technique. CONTRAST:  75mL OMNIPAQUE  IOHEXOL  350 MG/ML SOLN COMPARISON:  Most recent CT 09/13/2023 FINDINGS: Lower chest: Stable cardiomegaly. Stable left ventricular apex aneurysm. No basilar airspace disease or pleural effusion. Hepatobiliary: Focal fatty deposition adjacent to the falciform ligament. No evidence of focal liver lesion. Cholecystectomy without biliary dilatation. Pancreas: Increasing size of multi cystic process in the pancreatic head, representative measurement 5.7 x 4.7 cm, previously 3.2 x 3.3 cm. Multiple cystic spaces appearing densities and punctate calcifications. There is mild adjacent soft tissue stranding and edema. This causes mass effect on the adjacent stomach and duodenum as well as abuts the portal vein. No distal pancreatic atrophy or ductal dilatation. Spleen: Normal in size without focal abnormality. Adrenals/Urinary Tract: Normal adrenal glands. Lobulated renal contours. Punctate nonobstructing stone in the upper left kidney. No hydronephrosis. No suspicious renal lesion. Unremarkable urinary bladder. Stomach/Bowel: Mass effect in the distal stomach and duodenum from pancreatic head process. No proximal gastric distension to suggest obstruction. No small bowel obstruction or inflammatory change. Small-moderate volume of stool in the colon. The appendix is normal.  Vascular/Lymphatic: Aortic and branch atherosclerosis. No aneurysm. Pancreatic head process causes mass effect on the portal vein but no evidence of portal vein thrombus or occlusion. Superior mesenteric vein is patent. The splenic vein is patent. No definite enlarged lymph nodes. Reproductive: Uterus and bilateral adnexa are unremarkable. Other: No significant ascites.  No free air. Musculoskeletal: Postsurgical change in the lumbar spine. No acute osseous finding. IMPRESSION: 1.  Increasing size of multi cystic process in the pancreatic head, currently measuring 5.7 x 4.7 cm, previously 3.2 x 3.3 cm. There is mild adjacent soft tissue stranding and edema. Findings are suspicious for pancreatic pseudocysts with superimposed acute inflammation/pancreatitis. Underlying cystic neoplasm is not excluded. Recommend GI consultation and consideration of endoscopic ultrasound. 2. Punctate nonobstructing left renal stone. Aortic Atherosclerosis (ICD10-I70.0). Electronically Signed   By: Andrea Gasman M.D.   On: 10/02/2023 21:25   DG Chest 1 View Result Date: 10/02/2023 CLINICAL DATA:  Chest pain EXAM: CHEST  1 VIEW COMPARISON:  Chest radiograph September 21, 2023 FINDINGS: The heart size is enlarged. No new lung opacity or nodule. No pleural effusion or pneumothorax. Aortic knob is calcified. No acute osseous abnormality. Surgical clips from cholecystectomy in right upper quadrant. IMPRESSION: Cardiomegaly, improved to prior. No pleural effusion. Electronically Signed   By: Megan  Zare M.D.   On: 10/02/2023 15:29    EKG: Independently reviewed. Sinus tachycardia, QTc 506, T wave inversions in lateral leads. No significant change since previous tracing except heart rate and QT interval has increased.   Assessment and Plan  Acute on chronic pancreatitis Pancreatic pseudocysts Patient was admitted earlier this month for alcoholic pancreatitis now presenting with complaint of severe epigastric abdominal pain  radiating to her back since yesterday.  Her pain has now improved after receiving analgesics in the ED.  Denies nausea or vomiting.  Patient states she has not consumed any alcohol  since after her hospital discharge earlier this month.  WBC count 14.0, lipase 180, normal LFTs.  CT abdomen pelvis showing increasing size of multicystic process in the pancreatic head currently measuring 5.7 x 4.7 cm and previously was 3.2 x 3.3 cm.  There is mild adjacent soft tissue stranding and edema.  Findings are suspicious for pancreatic pseudocysts with superimposed acute inflammation/pancreatitis.  However, underlying cystic neoplasm is not excluded.  Patient is scheduled for EUS/FNA by Dr. Burnette next month on 10/15.  Continue gentle IV fluid hydration and monitor volume status closely given history of CHF.  Clear liquid diet ordered for now, advance as tolerated.  Trend lipase and WBC count.  Continue pain management.  Consult GI if not improving.  Alcohol  use disorder Patient states she has not consumed any alcohol  since after her hospital discharge 10 days ago.  Ethanol level  <15.  No signs of withdrawal at this time.  She was placed on CIWA monitoring in the ED, continue for now.  Chronic HFrEF due to suspected dilated alcoholic cardiomyopathy Recent echo done 09/23/2023 showing EF <20%, left atrium severely dilated, mild mitral regurgitation, mild aortic regurgitation. No signs of volume overload at this time.  Monitor volume status closely as patient is receiving IV fluids for pancreatitis.  History of ventricular thrombus Continue Eliquis .  Asthma/COPD Stable, no signs of acute exacerbation.  Continue Breztri  (hospital formulary replacement for her home Trelegy), Singulair , Claritin  (hospital formulary replacement for her home Allegra), and DuoNeb PRN.  Depression Continue Cymbalta .  Hypertension Currently normotensive.  Continue metoprolol .  Normocytic anemia Hemoglobin stable, monitor  labs.  Thrombocytosis Repeat CBC ordered to confirm.  Mild hyponatremia Continue gentle IV fluid hydration with normal saline and monitor labs.  QT prolongation Monitor potassium and magnesium  levels, replace as needed.  Avoid QT prolonging drugs.  Hyperlipidemia Continue Crestor .  GERD Continue Protonix .  DVT prophylaxis: Eliquis  Code Status: Full Code (discussed with the patient) Family Communication: No family at bedside.  Diagnostic findings and treatment plan discussed with the patient. Level  of care: Telemetry bed Admission status: It is my clinical opinion that referral for OBSERVATION is reasonable and necessary in this patient based on the above information provided. The aforementioned taken together are felt to place the patient at high risk for further clinical deterioration. However, it is anticipated that the patient may be medically stable for discharge from the hospital within 24 to 48 hours.  Editha Ram MD [facility]  If 7PM-7AM, please contact night-coverage [email or web address]  10/03/2023, 4:34 AM

## 2023-10-03 NOTE — Progress Notes (Signed)
 PROGRESS NOTE    Suzanne Jackson  FMW:[id number] DOB: 07/09/1970 DOA: 10/02/2023 PCP: Leonel Cole, MD   Brief Narrative:  HPI: Suzanne Jackson is a 53 y.o. female with medical history significant of alcohol  use disorder, alcoholic pancreatitis, HFrEF (EF <20% per echo 09/23/2023) due to suspected dilated alcoholic cardiomyopathy, ventricular thrombus on Eliquis , asthma/COPD, arthritis, depression, history of C. difficile colitis, nephrolithiasis, hypertension, marijuana and tobacco abuse, history of cholecystectomy.  Recent admission 9/5-9/8 for alcoholic pancreatitis.  Patient presents to the ED for evaluation of epigastric abdominal pain.  Vital signs on arrival: Temperature 98.2 F, pulse 101, respiratory rate 16, blood pressure 122/52, and SpO2 100% on room air.  Labs notable for WBC count 14.0, hemoglobin 11.0 (stable), MCV 98.3, platelet count 627k (previously 153k on labs 11 days ago), sodium 134, chloride 97, bicarb 21, anion gap 16, glucose 89, BUN 21, creatinine 0.8, normal LFTs, lipase 180, UA not suggestive of infection, troponin negative x 2, ethanol level <15.  Chest x-ray showing no acute findings.  CT abdomen pelvis showing increasing size of multicystic process in the pancreatic head currently measuring 5.7 x 4.7 cm and previously was 3.2 x 3.3 cm.  There is mild adjacent soft tissue stranding and edema.  Findings are suspicious for pancreatic pseudocysts with superimposed acute inflammation/pancreatitis.  However, underlying cystic neoplasm is not excluded.  Patient was given Benadryl , Dilaudid , hydroxyzine , Zofran , Percocet, Ativan , and 500 mL IV fluids.  [facility] called to admit.   Patient states she has not consumed any more alcohol  since after her hospital discharge earlier this month.  Yesterday she started having severe epigastric abdominal pain radiating to her back.  Her pain has now improved after receiving medications in the ED.  Denies nausea, vomiting, or diarrhea.   Denies fevers or chills.  Denies any pain in her chest or shortness of breath.  No other complaints.  Assessment & Plan:   Principal Problem:   Acute on chronic pancreatitis (HCC) Active Problems:   Alcohol  use disorder   Pancreatic pseudocyst   Chronic HFrEF (heart failure with reduced ejection fraction) (HCC)   COPD (chronic obstructive pulmonary disease) (HCC)  Acute on chronic pancreatitis / Pancreatic pseudocysts, POA: Patient was admitted earlier this month for alcoholic pancreatitis (discharged on 09/24/2023 ) presented with abdominal pain again and has another episode of acute on chronic pancreatitis with pseudocyst.  Patient states she has not consumed any alcohol  since after her hospital discharge earlier this month.  Patient is scheduled for EUS/FNA by Dr. Burnette next month on 10/15, I discussed with Dr. Burnette, he would prefer to do EUS at least few weeks after acute pancreatitis is healed.  Patient is feeling better, tolerating clear liquid diet, will advance to full liquid diet now and then soft later today with the hope that she will be ready for discharge tomorrow.   Alcohol  use disorder Patient states she has not consumed any alcohol  since after her hospital discharge 10 days ago.  Ethanol level  <15.  No signs of withdrawal at this time.  She was placed on CIWA monitoring in the ED, continue for now.   Chronic HFrEF due to suspected dilated alcoholic cardiomyopathy Recent echo done 09/23/2023 showing EF <20%, left atrium severely dilated, mild mitral regurgitation, mild aortic regurgitation. No signs of volume overload at this time.  Monitor volume status closely as patient is receiving IV fluids for pancreatitis.   History of ventricular thrombus Continue Eliquis .   Asthma/COPD Stable, no signs of acute exacerbation.  Continue Breztri  (hospital formulary replacement for her home Trelegy), Singulair , Claritin  (hospital formulary replacement for her home Allegra), and DuoNeb  PRN.   Depression Continue Cymbalta .   Hypertension Currently normotensive.  Continue metoprolol .   Normocytic anemia Hemoglobin stable, monitor labs.   Thrombocytosis Repeat CBC ordered to confirm.   Mild hyponatremia Continue gentle IV fluid hydration with normal saline and monitor labs.   QT prolongation Monitor potassium and magnesium  levels, replace as needed.  Avoid QT prolonging drugs.   Hyperlipidemia Continue Crestor .   GERD Continue Protonix .  Mild hyponatremia: 133 and asymptomatic.  Monitor.  DVT prophylaxis: Eliquis    Code Status: Full Code  Family Communication:  None present at bedside.  Plan of care discussed with patient in length and he/she verbalized understanding and agreed with it.  Status is: Observation The patient will require care spanning > 2 midnights and should be moved to inpatient because: Slowly advancing diet.   Estimated body mass index is 21.92 kg/m as calculated from the following:   Height as of this encounter: 5' 1 (1.549 m).   Weight as of this encounter: 52.6 kg.    Nutritional Assessment: Body mass index is 21.92 kg/m.Suzanne Jackson Seen by dietician.  I agree with the assessment and plan as outlined below: Nutrition Status:        . Skin Assessment: I have examined the patient's skin and I agree with the wound assessment as performed by the wound care RN as outlined below:    Consultants:  Discussed with GI over secure chat  Procedures:  None  Antimicrobials:  Anti-infectives (From admission, onward)    None         Subjective: Patient seen and examined, she says that she is feeling much better.  No abdominal pain, nausea or other complaint.  Objective: Vitals:   10/03/23 0217 10/03/23 0400 10/03/23 0500 10/03/23 0744  BP: 118/76 (!) 121/108 106/82 115/70  Pulse: 95 87 94 86  Resp: 16   20  Temp: 98.2 F (36.8 C)   98.6 F (37 C)  TempSrc: Oral   Oral  SpO2: 95% 96% 100% 97%  Weight:      Height:         Intake/Output Summary (Last 24 hours) at 10/03/2023 0939 Last data filed at 10/03/2023 0017 Gross per 24 hour  Intake 500 ml  Output --  Net 500 ml   Filed Weights   10/02/23 1422  Weight: 52.6 kg    Examination:  General exam: Appears calm and comfortable  Respiratory system: Clear to auscultation. Respiratory effort normal. Cardiovascular system: S1 & S2 heard, RRR. No JVD, murmurs, rubs, gallops or clicks. No pedal edema. Gastrointestinal system: Abdomen is nondistended, soft and nontender. No organomegaly or masses felt. Normal bowel sounds heard. Central nervous system: Alert and oriented. No focal neurological deficits. Extremities: Symmetric 5 x 5 power. Skin: No rashes, lesions or ulcers Psychiatry: Judgement and insight appear normal. Mood & affect appropriate.    Data Reviewed: I have personally reviewed following labs and imaging studies  CBC: Recent Labs  Lab 10/02/23 1535 10/03/23 0559  WBC 14.0* 10.0  NEUTROABS 10.3*  --   HGB 11.0* 10.2*  HCT 34.8* 32.1*  MCV 98.3 98.2  PLT 627* 555*   Basic Metabolic Panel: Recent Labs  Lab 10/02/23 1535 10/03/23 0559  NA 134* 133*  K 3.9 3.9  CL 97* 94*  CO2 21* 23  GLUCOSE 89 98  BUN 21* 13  CREATININE 0.79 0.80  CALCIUM   10.2 9.5  MG  --  2.3   GFR: Estimated Creatinine Clearance: 60.7 mL/min (by C-G formula based on SCr of 0.8 mg/dL). Liver Function Tests: Recent Labs  Lab 10/02/23 1535  AST 22  ALT 12  ALKPHOS 82  BILITOT 0.5  PROT 7.6  ALBUMIN 3.5   Recent Labs  Lab 10/02/23 1535 10/03/23 0559  LIPASE 180* 123*   No results for input(s): AMMONIA in the last 168 hours. Coagulation Profile: No results for input(s): INR, PROTIME in the last 168 hours. Cardiac Enzymes: No results for input(s): CKTOTAL, CKMB, CKMBINDEX, TROPONINI in the last 168 hours. BNP (last 3 results) No results for input(s): PROBNP in the last 8760 hours. HbA1C: No results for input(s): HGBA1C  in the last 72 hours. CBG: No results for input(s): GLUCAP in the last 168 hours. Lipid Profile: No results for input(s): CHOL, HDL, LDLCALC, TRIG, CHOLHDL, LDLDIRECT in the last 72 hours. Thyroid Function Tests: No results for input(s): TSH, T4TOTAL, FREET4, T3FREE, THYROIDAB in the last 72 hours. Anemia Panel: No results for input(s): VITAMINB12, FOLATE, FERRITIN, TIBC, IRON, RETICCTPCT in the last 72 hours. Sepsis Labs: No results for input(s): PROCALCITON, LATICACIDVEN in the last 168 hours.  No results found for this or any previous visit (from the past 240 hours).   Radiology Studies: CT ABDOMEN PELVIS W CONTRAST Result Date: 10/02/2023 CLINICAL DATA:  Acute abdominal pain. EXAM: CT ABDOMEN AND PELVIS WITH CONTRAST TECHNIQUE: Multidetector CT imaging of the abdomen and pelvis was performed using the standard protocol following bolus administration of intravenous contrast. RADIATION DOSE REDUCTION: This exam was performed according to the departmental dose-optimization program which includes automated exposure control, adjustment of the mA and/or kV according to patient size and/or use of iterative reconstruction technique. CONTRAST:  75mL OMNIPAQUE  IOHEXOL  350 MG/ML SOLN COMPARISON:  Most recent CT 09/13/2023 FINDINGS: Lower chest: Stable cardiomegaly. Stable left ventricular apex aneurysm. No basilar airspace disease or pleural effusion. Hepatobiliary: Focal fatty deposition adjacent to the falciform ligament. No evidence of focal liver lesion. Cholecystectomy without biliary dilatation. Pancreas: Increasing size of multi cystic process in the pancreatic head, representative measurement 5.7 x 4.7 cm, previously 3.2 x 3.3 cm. Multiple cystic spaces appearing densities and punctate calcifications. There is mild adjacent soft tissue stranding and edema. This causes mass effect on the adjacent stomach and duodenum as well as abuts the portal vein. No distal  pancreatic atrophy or ductal dilatation. Spleen: Normal in size without focal abnormality. Adrenals/Urinary Tract: Normal adrenal glands. Lobulated renal contours. Punctate nonobstructing stone in the upper left kidney. No hydronephrosis. No suspicious renal lesion. Unremarkable urinary bladder. Stomach/Bowel: Mass effect in the distal stomach and duodenum from pancreatic head process. No proximal gastric distension to suggest obstruction. No small bowel obstruction or inflammatory change. Small-moderate volume of stool in the colon. The appendix is normal. Vascular/Lymphatic: Aortic and branch atherosclerosis. No aneurysm. Pancreatic head process causes mass effect on the portal vein but no evidence of portal vein thrombus or occlusion. Superior mesenteric vein is patent. The splenic vein is patent. No definite enlarged lymph nodes. Reproductive: Uterus and bilateral adnexa are unremarkable. Other: No significant ascites.  No free air. Musculoskeletal: Postsurgical change in the lumbar spine. No acute osseous finding. IMPRESSION: 1. Increasing size of multi cystic process in the pancreatic head, currently measuring 5.7 x 4.7 cm, previously 3.2 x 3.3 cm. There is mild adjacent soft tissue stranding and edema. Findings are suspicious for pancreatic pseudocysts with superimposed acute inflammation/pancreatitis. Underlying cystic neoplasm is not  excluded. Recommend GI consultation and consideration of endoscopic ultrasound. 2. Punctate nonobstructing left renal stone. Aortic Atherosclerosis (ICD10-I70.0). Electronically Signed   By: Andrea Gasman M.D.   On: 10/02/2023 21:25   DG Chest 1 View Result Date: 10/02/2023 CLINICAL DATA:  Chest pain EXAM: CHEST  1 VIEW COMPARISON:  Chest radiograph September 21, 2023 FINDINGS: The heart size is enlarged. No new lung opacity or nodule. No pleural effusion or pneumothorax. Aortic knob is calcified. No acute osseous abnormality. Surgical clips from cholecystectomy in right  upper quadrant. IMPRESSION: Cardiomegaly, improved to prior. No pleural effusion. Electronically Signed   By: Megan  Zare M.D.   On: 10/02/2023 15:29    Scheduled Meds:  apixaban   5 mg Oral BID   budesonide -glycopyrrolate -formoterol   2 puff Inhalation BID   DULoxetine   60 mg Oral Daily   loratadine   10 mg Oral Daily   LORazepam   0-4 mg Intravenous Q6H   Or   LORazepam   0-4 mg Oral Q6H   [START ON 10/05/2023] LORazepam   0-4 mg Intravenous Q12H   Or   [START ON 10/05/2023] LORazepam   0-4 mg Oral Q12H   metoprolol  succinate  50 mg Oral Daily   montelukast   10 mg Oral QHS   pantoprazole   40 mg Oral QAC breakfast   rosuvastatin   5 mg Oral QHS   thiamine   100 mg Oral Daily   Or   thiamine   100 mg Intravenous Daily   Continuous Infusions:  sodium chloride  75 mL/hr at 10/03/23 0601     LOS: 0 days   Fredia Skeeter, MD [facility]  10/03/2023, 9:39 AM   *Please note that this is a verbal dictation therefore any spelling or grammatical errors are due to the Dragon [employer] One system interpretation.  Please page via Amion and do not message via secure chat for urgent patient care matters. Secure chat can be used for non urgent patient care matters.  How to contact the [facility] Attending or Consulting provider 7A - 7P or covering provider during after hours 7P -7A, for this patient?  Check the care team in [facility] and look for a) attending/consulting [facility] provider listed and b) the [facility] team listed. Page or secure chat 7A-7P. Log into [email or web address] and use [place]'s universal password to access. If you do not have the password, please contact the hospital operator. Locate the [facility] provider you are looking for under [facility] and page to a number that you can be directly reached. If you still have difficulty reaching the provider, please page the [facility] (Director on Call) for the Hospitalists listed on amion for assistance.

## 2023-10-03 NOTE — Progress Notes (Signed)
 Patient arrived to [phone] with a 4/10 pain located in the abdomen and radiating to the back. Patient A&Ox4 ambulating independently. All needs met at this time, bed in lowest position and call light within reach.

## 2023-10-03 NOTE — ED Notes (Signed)
 Admitting at bedside

## 2023-10-04 ENCOUNTER — Other Ambulatory Visit (HOSPITAL_COMMUNITY): Payer: Self-pay

## 2023-10-04 DIAGNOSIS — K861 Other chronic pancreatitis: Secondary | ICD-10-CM | POA: Diagnosis not present

## 2023-10-04 DIAGNOSIS — K859 Acute pancreatitis without necrosis or infection, unspecified: Secondary | ICD-10-CM | POA: Diagnosis not present

## 2023-10-04 LAB — CBC WITH DIFFERENTIAL/PLATELET
Abs Immature Granulocytes: 0.04 K/uL (ref 0.00–0.07)
Basophils Absolute: 0.2 K/uL — ABNORMAL HIGH (ref 0.0–0.1)
Basophils Relative: 2 %
Eosinophils Absolute: 0.4 K/uL (ref 0.0–0.5)
Eosinophils Relative: 4 %
HCT: 30.9 % — ABNORMAL LOW (ref 36.0–46.0)
Hemoglobin: 9.8 g/dL — ABNORMAL LOW (ref 12.0–15.0)
Immature Granulocytes: 1 %
Lymphocytes Relative: 19 %
Lymphs Abs: 1.7 K/uL (ref 0.7–4.0)
MCH: 31.1 pg (ref 26.0–34.0)
MCHC: 31.7 g/dL (ref 30.0–36.0)
MCV: 98.1 fL (ref 80.0–100.0)
Monocytes Absolute: 1.5 K/uL — ABNORMAL HIGH (ref 0.1–1.0)
Monocytes Relative: 17 %
Neutro Abs: 5 K/uL (ref 1.7–7.7)
Neutrophils Relative %: 57 %
Platelets: 581 K/uL — ABNORMAL HIGH (ref 150–400)
RBC: 3.15 MIL/uL — ABNORMAL LOW (ref 3.87–5.11)
RDW: 16.9 % — ABNORMAL HIGH (ref 11.5–15.5)
WBC: 8.8 K/uL (ref 4.0–10.5)
nRBC: 0 % (ref 0.0–0.2)

## 2023-10-04 LAB — COMPREHENSIVE METABOLIC PANEL WITH GFR
ALT: 10 U/L (ref 0–44)
AST: 15 U/L (ref 15–41)
Albumin: 2.9 g/dL — ABNORMAL LOW (ref 3.5–5.0)
Alkaline Phosphatase: 67 U/L (ref 38–126)
Anion gap: 11 (ref 5–15)
BUN: 11 mg/dL (ref 6–20)
CO2: 27 mmol/L (ref 22–32)
Calcium: 9.4 mg/dL (ref 8.9–10.3)
Chloride: 101 mmol/L (ref 98–111)
Creatinine, Ser: 0.6 mg/dL (ref 0.44–1.00)
GFR, Estimated: >60 mL/min (ref >60)
Glucose, Bld: 126 mg/dL — ABNORMAL HIGH (ref 70–99)
Potassium: 4.5 mmol/L (ref 3.5–5.1)
Sodium: 139 mmol/L (ref 135–145)
Total Bilirubin: 0.4 mg/dL (ref 0.0–1.2)
Total Protein: 6.6 g/dL (ref 6.5–8.1)

## 2023-10-04 MED ORDER — HYDROCODONE-ACETAMINOPHEN 7.5-325 MG PO TABS
1.0000 | ORAL_TABLET | ORAL | 0 refills | Status: AC
  Filled 2023-10-04: qty 15, 3d supply, fill #0

## 2023-10-04 NOTE — Discharge Summary (Addendum)
 Physician Discharge Summary  Suzanne Jackson FMW:[id number] DOB: [birth year] DOA: 10/02/2023  PCP: Leonel Cole, MD  Admit date: 10/02/2023 Discharge date: 10/04/2023 30 Day Unplanned Readmission Risk Score    Flowsheet Row ED to Hosp-Admission (Discharged) from 09/21/2023 in [facility]  30 Day Unplanned Readmission Risk Score (%) 27.25 Filed at 09/24/2023 0801    This score is the patient's risk of an unplanned readmission within 30 days of being discharged (0 -100%). The score is based on dignosis, age, lab data, medications, orders, and past utilization.   Low:  0-14.9   Medium: 15-21.9   High: 22-29.9   Extreme: 30 and above          Admitted From: Home Disposition: Home  Recommendations for Outpatient Follow-up:  Follow up with PCP in 1-2 weeks Please obtain BMP/CBC in one week Please follow up with your PCP on the following pending results: Unresulted Labs (From admission, onward)    None         Home Health: None Equipment/Devices: None  Discharge Condition: stable  CODE STATUS: Full code Diet recommendation:  Diet Order             DIET SOFT Room service appropriate? Yes; Fluid consistency: Thin  Diet effective now                   Subjective: Seen and examined, she is complaining of very minimal pain but tolerating soft diet without any nausea or vomiting.  Nontender on abdominal exam.  She feels comfortable going home and continue soft diet for next 1 to 2 days.  Brief/Interim Summary: Suzanne Jackson is a 53 y.o. female with medical history significant of alcohol  use disorder, alcoholic pancreatitis, HFrEF (EF <20% per echo 09/23/2023) due to suspected dilated alcoholic cardiomyopathy, ventricular thrombus on Eliquis , asthma/COPD, arthritis, depression, history of C. difficile colitis, nephrolithiasis, hypertension, marijuana and tobacco abuse, history of cholecystectomy.  Recent admission 9/5-9/8 for  alcoholic pancreatitis.  Patient presents to the ED for evaluation of epigastric abdominal pain.  Vital signs on arrival: Temperature 98.2 F, pulse 101, respiratory rate 16, blood pressure 122/52, and SpO2 100% on room air.  Labs notable for WBC count 14.0, hemoglobin 11.0 (stable), MCV 98.3, platelet count 627k (previously 153k on labs 11 days ago), sodium 134, chloride 97, bicarb 21, anion gap 16, glucose 89, BUN 21, creatinine 0.8, normal LFTs, lipase 180, UA not suggestive of infection, troponin negative x 2, ethanol level <15.  Chest x-ray showing no acute findings.  CT abdomen pelvis showing increasing size of multicystic process in the pancreatic head currently measuring 5.7 x 4.7 cm and previously was 3.2 x 3.3 cm.  There is mild adjacent soft tissue stranding and edema.  Findings are suspicious for pancreatic pseudocysts with superimposed acute inflammation/pancreatitis.  However, underlying cystic neoplasm is not excluded.  Patient was given Benadryl , Dilaudid , hydroxyzine , Zofran , Percocet, Ativan , and 500 mL IV fluids.  Admitted to hospital service.  Details below.   Acute on chronic pancreatitis / Pancreatic pseudocysts, POA: Patient was admitted earlier this month for alcoholic pancreatitis (discharged on 09/24/2023 ) presented with abdominal pain again and has another episode of acute on chronic pancreatitis with pseudocyst.  Patient states she has not consumed any alcohol  since after her hospital discharge earlier this month.  Patient is scheduled for EUS/FNA by Dr. Burnette next month on 10/15, I discussed with Dr. Burnette, he would prefer to do EUS at least few weeks  after acute pancreatitis is healed.  Patient is feeling better, tolerating soft diet without any tenderness.  She feels comfortable going home.  I have advised her to stick to soft diet for next 1 to 2 days and then advance to regular diet however avoid meat products for at least 1 week.  She has follow-up with her GI tomorrow.   Refilling 15 tablets of pain medications per her request.   Alcohol  use disorder Patient states she has not consumed any alcohol  since after her hospital discharge 10 days ago.  Ethanol level  <15.  No signs of withdrawal at this time.  She was placed on CIWA monitoring in the ED, continue for now.   Chronic HFrEF due to suspected dilated alcoholic cardiomyopathy Recent echo done 09/23/2023 showing EF <20%, left atrium severely dilated, mild mitral regurgitation, mild aortic regurgitation. No signs of volume overload at this time.    History of ventricular thrombus Continue Eliquis .   Asthma/COPD Stable, no signs of acute exacerbation.  Continue Breztri  (hospital formulary replacement for her home Trelegy), Singulair , Claritin  (hospital formulary replacement for her home Allegra), and DuoNeb PRN.   Depression Continue Cymbalta .   Hypertension Currently blood pressure slightly low.  Continue metoprolol  and Losartan as she needs both of them due to history of severe systolic CHF.SABRA   Normocytic anemia Hemoglobin stable, monitor labs.   Thrombocytosis Repeat CBC ordered to confirm.   Mild hyponatremia Likely hypovolemic, resolved with IV fluid hydration.   QT prolongation Monitor potassium and magnesium  levels, replace as needed.  Avoid QT prolonging drugs.   Hyperlipidemia Continue Crestor .   GERD Continue Protonix .  Discharge plan was discussed with patient and/or family member and they verbalized understanding and agreed with it.  Discharge Diagnoses:  Principal Problem:   Acute on chronic pancreatitis [facility]) Active Problems:   Alcohol  use disorder   Pancreatic pseudocyst   Chronic HFrEF (heart failure with reduced ejection fraction) (HCC)   COPD (chronic obstructive pulmonary disease) (HCC)    Discharge Instructions   Allergies as of 10/04/2023       Reactions   Aleve [naproxen] Anaphylaxis       Aspirin Anaphylaxis, Shortness Of Breath   Ibuprofen Anaphylaxis    Oxycodone -acetaminophen  Itching, Other (See Comments)   Pt states okay to take with hydroxyzine    Bupropion Other (See Comments)   Anger        Medication List     TAKE these medications    albuterol  108 (90 Base) MCG/ACT inhaler Commonly known as: VENTOLIN  HFA Inhale 2 puffs into the lungs 2 (two) times daily.   albuterol  (2.5 MG/3ML) 0.083% nebulizer solution Commonly known as: PROVENTIL  Take 3 mLs by nebulization every 6 (six) hours as needed for wheezing or shortness of breath.   DULoxetine  60 MG capsule Commonly known as: CYMBALTA  Take 60 mg by mouth daily.   Eliquis  5 MG Tabs tablet Generic drug: apixaban  Take 5 mg by mouth 2 (two) times daily.   Farxiga 10 MG Tabs tablet Generic drug: dapagliflozin propanediol Take 10 mg by mouth daily.   fexofenadine 180 MG tablet Commonly known as: ALLEGRA Take 180 mg by mouth daily.   folic acid  1 MG tablet Commonly known as: FOLVITE  Take 1 tablet (1 mg total) by mouth daily.   furosemide  20 MG tablet Commonly known as: LASIX  Take 1 tablet (20 mg total) by mouth daily. What changed: when to take this   HYDROcodone -acetaminophen  7.5-325 MG tablet Commonly known as: NORCO Take 1 tablet by  mouth See admin instructions. Take 1 tablet by mouth every four to six hours as needed for pain   hydrOXYzine  50 MG tablet Commonly known as: ATARAX  Take 1 tablet (50 mg total) by mouth every 6 (six) hours as needed (refractory itching).   ipratropium-albuterol  0.5-2.5 (3) MG/3ML Soln Commonly known as: DUONEB Take 3 mLs by nebulization every 6 (six) hours as needed (for shortness of breath or wheezing).   losartan 25 MG tablet Commonly known as: COZAAR Take 25 mg by mouth daily.   metoprolol  succinate 50 MG 24 hr tablet Commonly known as: TOPROL -XL Take 50 mg by mouth daily.   montelukast  10 MG tablet Commonly known as: SINGULAIR  Take 10 mg by mouth at bedtime.   nicotine  14 mg/24hr patch Commonly known as: NICODERM  CQ - dosed in mg/24 hours Place 1 patch (14 mg total) onto the skin daily.   pantoprazole  40 MG tablet Commonly known as: PROTONIX  Take 40 mg by mouth daily before breakfast.   potassium chloride  SA 20 MEQ tablet Commonly known as: KLOR-CON  M Take 20 mEq by mouth See admin instructions. Take 20 mEq by mouth in the morning and evening   rosuvastatin  5 MG tablet Commonly known as: CRESTOR  Take 5 mg by mouth at bedtime.   thiamine  100 MG tablet Commonly known as: VITAMIN B1 Take 1 tablet (100 mg total) by mouth daily.   Trelegy Ellipta 100-62.5-25 MCG/ACT Aepb Generic drug: Fluticasone -Umeclidin-Vilant Take 1 puff by mouth daily.   Tylenol  8 Hour 650 MG CR tablet Generic drug: acetaminophen  Take 650-1,300 mg by mouth every 8 (eight) hours as needed for pain.   Vitamin D (Ergocalciferol) 1.25 MG (50000 UNIT) Caps capsule Commonly known as: DRISDOL Take 50,000 Units by mouth once a week.        Follow-up Information     Leonel Cole, MD Follow up in 1 week(s).   Specialty: Family Medicine Contact information: [address] [place] [address] [phone]                Allergies  Allergen Reactions   Aleve [Naproxen] Anaphylaxis        Aspirin Anaphylaxis and Shortness Of Breath   Ibuprofen Anaphylaxis   Oxycodone -Acetaminophen  Itching and Other (See Comments)    Pt states okay to take with hydroxyzine    Bupropion Other (See Comments)    Anger    Consultations: Curbside with GI   Procedures/Studies: CT ABDOMEN PELVIS W CONTRAST Result Date: 10/02/2023 CLINICAL DATA:  Acute abdominal pain. EXAM: CT ABDOMEN AND PELVIS WITH CONTRAST TECHNIQUE: Multidetector CT imaging of the abdomen and pelvis was performed using the standard protocol following bolus administration of intravenous contrast. RADIATION DOSE REDUCTION: This exam was performed according to the departmental dose-optimization program which includes automated exposure control,  adjustment of the mA and/or kV according to patient size and/or use of iterative reconstruction technique. CONTRAST:  75mL OMNIPAQUE  IOHEXOL  350 MG/ML SOLN COMPARISON:  Most recent CT 09/13/2023 FINDINGS: Lower chest: Stable cardiomegaly. Stable left ventricular apex aneurysm. No basilar airspace disease or pleural effusion. Hepatobiliary: Focal fatty deposition adjacent to the falciform ligament. No evidence of focal liver lesion. Cholecystectomy without biliary dilatation. Pancreas: Increasing size of multi cystic process in the pancreatic head, representative measurement 5.7 x 4.7 cm, previously 3.2 x 3.3 cm. Multiple cystic spaces appearing densities and punctate calcifications. There is mild adjacent soft tissue stranding and edema. This causes mass effect on the adjacent stomach and duodenum as well as abuts the portal  vein. No distal pancreatic atrophy or ductal dilatation. Spleen: Normal in size without focal abnormality. Adrenals/Urinary Tract: Normal adrenal glands. Lobulated renal contours. Punctate nonobstructing stone in the upper left kidney. No hydronephrosis. No suspicious renal lesion. Unremarkable urinary bladder. Stomach/Bowel: Mass effect in the distal stomach and duodenum from pancreatic head process. No proximal gastric distension to suggest obstruction. No small bowel obstruction or inflammatory change. Small-moderate volume of stool in the colon. The appendix is normal. Vascular/Lymphatic: Aortic and branch atherosclerosis. No aneurysm. Pancreatic head process causes mass effect on the portal vein but no evidence of portal vein thrombus or occlusion. Superior mesenteric vein is patent. The splenic vein is patent. No definite enlarged lymph nodes. Reproductive: Uterus and bilateral adnexa are unremarkable. Other: No significant ascites.  No free air. Musculoskeletal: Postsurgical change in the lumbar spine. No acute osseous finding. IMPRESSION: 1. Increasing size of multi cystic process in the  pancreatic head, currently measuring 5.7 x 4.7 cm, previously 3.2 x 3.3 cm. There is mild adjacent soft tissue stranding and edema. Findings are suspicious for pancreatic pseudocysts with superimposed acute inflammation/pancreatitis. Underlying cystic neoplasm is not excluded. Recommend GI consultation and consideration of endoscopic ultrasound. 2. Punctate nonobstructing left renal stone. Aortic Atherosclerosis (ICD10-I70.0). Electronically Signed   By: Andrea Gasman M.D.   On: 10/02/2023 21:25   DG Chest 1 View Result Date: 10/02/2023 CLINICAL DATA:  Chest pain EXAM: CHEST  1 VIEW COMPARISON:  Chest radiograph September 21, 2023 FINDINGS: The heart size is enlarged. No new lung opacity or nodule. No pleural effusion or pneumothorax. Aortic knob is calcified. No acute osseous abnormality. Surgical clips from cholecystectomy in right upper quadrant. IMPRESSION: Cardiomegaly, improved to prior. No pleural effusion. Electronically Signed   By: Megan  Zare M.D.   On: 10/02/2023 15:29   ECHOCARDIOGRAM COMPLETE Result Date: 09/23/2023    ECHOCARDIOGRAM REPORT   Patient Name:   Suzanne Jackson Date of Exam: 09/23/2023 Medical Rec #:  [id number]         Height:       61.0 in Accession #:    [id number]        Weight:       117.3 lb Date of Birth:  October 22, 1970          BSA:          1.505 m Patient Age:    53 years          BP:           111/69 mmHg Patient Gender: F                 HR:           100 bpm. Exam Location:  Inpatient Procedure: 2D Echo, Cardiac Doppler, Color Doppler and Intracardiac            Opacification Agent (Both Spectral and Color Flow Doppler were            utilized during procedure). Indications:    Abnormal EKG R94.31  History:        Patient has no prior history of Echocardiogram examinations.                 Abnormal ECG; Risk Factors:Hypertension and Current Smoker.  Sonographer:    BERNARDA ROCKS Referring Phys: [id number] AMRIT ADHIKARI IMPRESSIONS  1. No LV thrombus by Defnity. Left  ventricular ejection fraction, by estimation, is <20%. The left ventricle has severely decreased function. The left ventricle demonstrates global hypokinesis. The left  ventricular internal cavity size was severely dilated. Left ventricular diastolic parameters are indeterminate.  2. Right ventricular systolic function is normal. The right ventricular size is normal. There is mildly elevated pulmonary artery systolic pressure. The estimated right ventricular systolic pressure is 38.8 mmHg.  3. Left atrial size was severely dilated.  4. The mitral valve is normal in structure. Mild mitral valve regurgitation. No evidence of mitral stenosis.  5. The aortic valve has an indeterminant number of cusps. Aortic valve regurgitation is mild. No aortic stenosis is present. Aortic regurgitation PHT measures 439 msec. Aortic valve mean gradient measures 4.0 mmHg.  6. The inferior vena cava is normal in size with greater than 50% respiratory variability, suggesting right atrial pressure of 3 mmHg. Comparison(s): No prior Echocardiogram. FINDINGS  Left Ventricle: No LV thrombus by Defnity. Left ventricular ejection fraction, by estimation, is <20%. The left ventricle has severely decreased function. The left ventricle demonstrates global hypokinesis. Definity  contrast agent was given IV to delineate the left ventricular endocardial borders. Strain was performed and the global longitudinal strain is indeterminate. The left ventricular internal cavity size was severely dilated. There is no left ventricular hypertrophy. Left ventricular diastolic parameters are indeterminate. Right Ventricle: The right ventricular size is normal. No increase in right ventricular wall thickness. Right ventricular systolic function is normal. There is mildly elevated pulmonary artery systolic pressure. The tricuspid regurgitant velocity is 2.99  m/s, and with an assumed right atrial pressure of 3 mmHg, the estimated right ventricular systolic pressure  is 38.8 mmHg. Left Atrium: Left atrial size was severely dilated. Right Atrium: Right atrial size was normal in size. Pericardium: There is no evidence of pericardial effusion. Mitral Valve: The mitral valve is normal in structure. Mild mitral valve regurgitation. No evidence of mitral valve stenosis. MV peak gradient, 5.6 mmHg. The mean mitral valve gradient is 3.0 mmHg. Tricuspid Valve: The tricuspid valve is grossly normal. Tricuspid valve regurgitation is trivial. No evidence of tricuspid stenosis. Aortic Valve: The aortic valve has an indeterminant number of cusps. Aortic valve regurgitation is mild. Aortic regurgitation PHT measures 439 msec. No aortic stenosis is present. Aortic valve mean gradient measures 4.0 mmHg. Aortic valve peak gradient measures 8.6 mmHg. Aortic valve area, by VTI measures 2.32 cm. Pulmonic Valve: The pulmonic valve was normal in structure. Pulmonic valve regurgitation is trivial. No evidence of pulmonic stenosis. Aorta: The aortic root and ascending aorta are structurally normal, with no evidence of dilitation. Venous: The inferior vena cava is normal in size with greater than 50% respiratory variability, suggesting right atrial pressure of 3 mmHg. IAS/Shunts: No atrial level shunt detected by color flow Doppler. Additional Comments: 3D was performed not requiring image post processing on an independent workstation and was indeterminate.  LEFT VENTRICLE PLAX 2D LVIDd:         6.70 cm      Diastology LVIDs:         5.70 cm      LV e' medial:    6.53 cm/s LV PW:         0.90 cm      LV E/e' medial:  18.8 LV IVS:        0.90 cm      LV e' lateral:   12.00 cm/s LVOT diam:     2.30 cm      LV E/e' lateral: 10.2 LV SV:         55 LV SV Index:   37 LVOT Area:  4.15 cm  LV Volumes (MOD) LV vol d, MOD A2C: 260.0 ml LV vol d, MOD A4C: 246.0 ml LV vol s, MOD A2C: 222.0 ml LV vol s, MOD A4C: 170.0 ml LV SV MOD A2C:     38.0 ml LV SV MOD A4C:     246.0 ml LV SV MOD BP:      56.9 ml RIGHT  VENTRICLE             IVC RV Basal diam:  3.70 cm     IVC diam: 1.70 cm RV S prime:     17.30 cm/s TAPSE (M-mode): 2.4 cm LEFT ATRIUM              Index        RIGHT ATRIUM           Index LA diam:        4.60 cm  3.06 cm/m   RA Area:     13.70 cm LA Vol (A2C):   95.4 ml  63.37 ml/m  RA Volume:   30.80 ml  20.46 ml/m LA Vol (A4C):   102.0 ml 67.75 ml/m LA Biplane Vol: 98.9 ml  65.69 ml/m  AORTIC VALVE                    PULMONIC VALVE AV Area (Vmax):    2.00 cm     PV Vmax:          1.20 m/s AV Area (Vmean):   2.14 cm     PV Peak grad:     5.8 mmHg AV Area (VTI):     2.32 cm     PR End Diast Vel: 1.81 msec AV Vmax:           147.00 cm/s AV Vmean:          87.400 cm/s AV VTI:            0.238 m AV Peak Grad:      8.6 mmHg AV Mean Grad:      4.0 mmHg LVOT Vmax:         70.90 cm/s LVOT Vmean:        45.000 cm/s LVOT VTI:          0.133 m LVOT/AV VTI ratio: 0.56 AI PHT:            439 msec  AORTA Ao Root diam: 3.00 cm Ao Asc diam:  3.60 cm MITRAL VALVE                TRICUSPID VALVE MV Area (PHT): 6.83 cm     TR Peak grad:   35.8 mmHg MV Area VTI:   2.69 cm     TR Vmax:        299.00 cm/s MV Peak grad:  5.6 mmHg MV Mean grad:  3.0 mmHg     SHUNTS MV Vmax:       1.18 m/s     Systemic VTI:  0.13 m MV Vmean:      73.9 cm/s    Systemic Diam: 2.30 cm MV Decel Time: 111 msec MR Peak grad: 66.0 mmHg MR Vmax:      406.33 cm/s MV E velocity: 123.00 cm/s MV A velocity: 60.30 cm/s MV E/A ratio:  2.04 Vishnu Priya Mallipeddi Electronically signed by Diannah Late Mallipeddi Signature Date/Time: 09/23/2023/2:30:34 PM    Final    DG Chest Portable 1 View Result Date: 09/21/2023 EXAM: 1 VIEW(S) XRAY OF THE  CHEST 09/21/2023 11:43:58 PM COMPARISON: 07/23/2023 CLINICAL HISTORY: Chest pain. Patient requesting detox from alcohol , last drink yesterday. Advises approximately a fifth per day for several years, denies history of DT, states she's been hospitalized because of the alcohol , but not withdrawal. Advises bad nausea, no PO  intake today. FINDINGS: LUNGS AND PLEURA: Possible trace right pleural effusion. HEART AND MEDIASTINUM: Cardiomegaly, similar to prior. BONES AND SOFT TISSUES: Aortic atherosclerosis. IMPRESSION: 1. Cardiomegaly, similar to prior. 2. Possible trace right pleural effusion. Electronically signed by: Norman Gatlin MD 09/21/2023 11:49 PM EDT RP Workstation: [id number]   CT Head Wo Contrast Result Date: 09/16/2023 CLINICAL DATA:  Mental status change, unknown cause Neuro deficit, acute, stroke suspected bilateral blurry vision EXAM: CT HEAD WITHOUT CONTRAST TECHNIQUE: Contiguous axial images were obtained from the base of the skull through the vertex without intravenous contrast. RADIATION DOSE REDUCTION: This exam was performed according to the departmental dose-optimization program which includes automated exposure control, adjustment of the mA and/or kV according to patient size and/or use of iterative reconstruction technique. COMPARISON:  Head CT 1 week ago 09/09/2023 FINDINGS: Brain: No intracranial hemorrhage, mass effect, or midline shift. Brain volume is normal for age. No hydrocephalus. The basilar cisterns are patent. Periventricular and deep white matter hypodensity typical of chronic small vessel ischemia. Remote right cerebellar lacunar infarct, unchanged. No evidence of territorial infarct or acute ischemia. No extra-axial or intracranial fluid collection. Vascular: Atherosclerosis of skullbase vasculature without hyperdense vessel or abnormal calcification. Skull: No fracture or focal lesion. Sinuses/Orbits: Diffuse mucosal thickening, partially included in the field of view. This is unchanged from prior. Other: None. IMPRESSION: 1. No acute intracranial abnormality. 2. Chronic small vessel ischemia. Remote right cerebellar lacunar infarct. Electronically Signed   By: Andrea Gasman M.D.   On: 09/16/2023 15:44   CT ABDOMEN PELVIS W CONTRAST Result Date: 09/16/2023 CLINICAL DATA:  Elevated LFTs,  rectal bleeding, history of uterine ablation * Tracking Code: BO * EXAM: CT ABDOMEN AND PELVIS WITH CONTRAST TECHNIQUE: Multidetector CT imaging of the abdomen and pelvis was performed using the standard protocol following bolus administration of intravenous contrast. RADIATION DOSE REDUCTION: This exam was performed according to the departmental dose-optimization program which includes automated exposure control, adjustment of the mA and/or kV according to patient size and/or use of iterative reconstruction technique. CONTRAST:  100mL ISOVUE -300 IOPAMIDOL  (ISOVUE -300) INJECTION 61% COMPARISON:  CT abdomen pelvis, 04/14/2020 FINDINGS: Lower chest: No acute abnormality. Cardiomegaly. Subendocardial scarring of the left ventricular apex with aneurysm and apical thrombus (series 2, image 5, series 5, image 98). Probable Hepatobiliary: No focal liver abnormality is seen. Focal fatty deposition adjacent to the falciform ligament, characteristic in appearance and location, requiring no further follow-up or characterization. Status post cholecystectomy. No biliary dilatation. Pancreas: Multiple complex cysts or complex cystic lesion in the central pancreatic head, in total measuring 5.6 x 3.3 x 3.2 cm (series 5, image 72, series 2, image 20). This is substantially increased in size and complexity when compared to examination dated 2022, at which time there were small adjacent cystic lesions measuring 1.0 cm. Scattered coarse internal calcifications. No pancreatic ductal dilatation. No pancreatic ductal dilatation or surrounding inflammatory changes. Spleen: Normal in size without significant abnormality. Adrenals/Urinary Tract: Adrenal glands are unremarkable. Kidneys are normal, without renal calculi, solid lesion, or hydronephrosis. Bladder is unremarkable. Stomach/Bowel: Stomach is within normal limits. Appendix appears normal. No evidence of bowel wall thickening, distention, or inflammatory changes.  Vascular/Lymphatic: Aortic atherosclerosis. No enlarged abdominal or pelvic lymph nodes. Reproductive: No  mass or other significant abnormality. Other: No abdominal wall hernia or abnormality. No ascites. Musculoskeletal: No acute or significant osseous findings. IMPRESSION: 1. Multiple complex cysts or complex cystic lesion in the central pancreatic head, in total measuring 5.6 x 3.3 x 3.2 cm. This is substantially increased in size and complexity when compared to examination dated 2022, at which time there were small adjacent cystic lesions measuring 1.0 cm. Scattered coarse internal calcifications. No pancreatic ductal dilatation or surrounding acute inflammatory changes. Findings are most suggestive of pseudocystic change and chronic pancreatitis, however cystic pancreatic neoplasm is a substantial differential consideration. Given size and complexity, consider EUS/FNA for tissue diagnosis. 2. No evidence of lymphadenopathy or metastatic disease in the abdomen or pelvis. 3. Cardiomegaly. 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 4. Status post cholecystectomy. No biliary dilatation. Aortic Atherosclerosis (ICD10-I70.0). Electronically Signed   By: Marolyn JONETTA Jaksch M.D.   On: 09/16/2023 15:33   CT Head Wo Contrast Result Date: 09/09/2023 CLINICAL DATA:  Altered mental status.  Unresponsive patient. EXAM: CT HEAD WITHOUT CONTRAST TECHNIQUE: Contiguous axial images were obtained from the base of the skull through the vertex without intravenous contrast. RADIATION DOSE REDUCTION: This exam was performed according to the departmental dose-optimization program which includes automated exposure control, adjustment of the mA and/or kV according to patient size and/or use of iterative reconstruction technique. COMPARISON:  October 08, 2018 FINDINGS: Brain: There is generalized cerebral atrophy with widening of the extra-axial spaces and ventricular dilatation. There are  areas of decreased attenuation within the white matter tracts of the supratentorial brain, consistent with microvascular disease changes. Vascular: No hyperdense vessel or unexpected calcification. Skull: Normal. Negative for fracture or focal lesion. Sinuses/Orbits: Moderate severity bilateral maxillary sinus mucosal thickening is seen. Marked severity bilateral ethmoid sinus, frontal sinus and sphenoid sinus mucosal thickening is also noted. Postoperative changes are suspected along the medial walls of the bilateral maxillary sinuses. Other: None. IMPRESSION: 1. Generalized cerebral atrophy and microvascular disease changes of the supratentorial brain. 2. No acute intracranial abnormality. 3. Marked severity paranasal disease. Electronically Signed   By: Suzen Dials M.D.   On: 09/09/2023 20:56     Discharge Exam: Vitals:   10/04/23 0355 10/04/23 0753  BP: 111/63 109/72  Pulse: 80 77  Resp: 17 17  Temp: 97.6 F (36.4 C) 97.7 F (36.5 C)  SpO2: 98% 99%   Vitals:   10/03/23 2215 10/03/23 2226 10/04/23 0355 10/04/23 0753  BP: 111/62  111/63 109/72  Pulse: 82  80 77  Resp: 16  17 17   Temp: 97.9 F (36.6 C)  97.6 F (36.4 C) 97.7 F (36.5 C)  TempSrc: Oral  Oral Oral  SpO2: 98% 95% 98% 99%  Weight:      Height:        General: Pt is alert, awake, not in acute distress Cardiovascular: RRR, S1/S2 +, no rubs, no gallops Respiratory: CTA bilaterally, no wheezing, no rhonchi Abdominal: Soft, NT, ND, bowel sounds + Extremities: no edema, no cyanosis    The results of significant diagnostics from this hospitalization (including imaging, microbiology, ancillary and laboratory) are listed below for reference.     Microbiology: No results found for this or any previous visit (from the past 240 hours).   Labs: BNP (last 3 results) Recent Labs    07/23/23 2324  BNP 2,032.5*   Basic Metabolic Panel: Recent Labs  Lab 10/02/23 1535 10/03/23 0559 10/04/23 0521  NA 134* 133*  139  K  3.9 3.9 4.5  CL 97* 94* 101  CO2 21* 23 27  GLUCOSE 89 98 126*  BUN 21* 13 11  CREATININE 0.79 0.80 0.60  CALCIUM  10.2 9.5 9.4  MG  --  2.3  --    Liver Function Tests: Recent Labs  Lab 10/02/23 1535 10/04/23 0521  AST 22 15  ALT 12 10  ALKPHOS 82 67  BILITOT 0.5 0.4  PROT 7.6 6.6  ALBUMIN 3.5 2.9*   Recent Labs  Lab 10/02/23 1535 10/03/23 0559  LIPASE 180* 123*   No results for input(s): AMMONIA in the last 168 hours. CBC: Recent Labs  Lab 10/02/23 1535 10/03/23 0559 10/04/23 0521  WBC 14.0* 10.0 8.8  NEUTROABS 10.3*  --  5.0  HGB 11.0* 10.2* 9.8*  HCT 34.8* 32.1* 30.9*  MCV 98.3 98.2 98.1  PLT 627* 555* 581*   Cardiac Enzymes: No results for input(s): CKTOTAL, CKMB, CKMBINDEX, TROPONINI in the last 168 hours. BNP: Invalid input(s): POCBNP CBG: No results for input(s): GLUCAP in the last 168 hours. D-Dimer No results for input(s): DDIMER in the last 72 hours. Hgb A1c No results for input(s): HGBA1C in the last 72 hours. Lipid Profile No results for input(s): CHOL, HDL, LDLCALC, TRIG, CHOLHDL, LDLDIRECT in the last 72 hours. Thyroid function studies No results for input(s): TSH, T4TOTAL, T3FREE, THYROIDAB in the last 72 hours.  Invalid input(s): FREET3 Anemia work up No results for input(s): VITAMINB12, FOLATE, FERRITIN, TIBC, IRON, RETICCTPCT in the last 72 hours. Urinalysis    Component Value Date/Time   COLORURINE YELLOW 10/02/2023 1522   APPEARANCEUR HAZY (A) 10/02/2023 1522   LABSPEC 1.024 10/02/2023 1522   PHURINE 5.0 10/02/2023 1522   GLUCOSEU >=500 (A) 10/02/2023 1522   HGBUR NEGATIVE 10/02/2023 1522   BILIRUBINUR NEGATIVE 10/02/2023 1522   KETONESUR 20 (A) 10/02/2023 1522   PROTEINUR NEGATIVE 10/02/2023 1522   UROBILINOGEN 1.0 10/28/2008 1106   NITRITE NEGATIVE 10/02/2023 1522   LEUKOCYTESUR NEGATIVE 10/02/2023 1522   Sepsis Labs Recent Labs  Lab 10/02/23 1535  10/03/23 0559 10/04/23 0521  WBC 14.0* 10.0 8.8   Microbiology No results found for this or any previous visit (from the past 240 hours).  FURTHER DISCHARGE INSTRUCTIONS:   Get Medicines reviewed and adjusted: Please take all your medications with you for your next visit with your Primary MD   Laboratory/radiological data: Please request your Primary MD to go over all hospital tests and procedure/radiological results at the follow up, please ask your Primary MD to get all Hospital records sent to his/her office.   In some cases, they will be blood work, cultures and biopsy results pending at the time of your discharge. Please request that your primary care M.D. goes through all the records of your hospital data and follows up on these results.   Also Note the following: If you experience worsening of your admission symptoms, develop shortness of breath, life threatening emergency, suicidal or homicidal thoughts you must seek medical attention immediately by calling 911 or calling your MD immediately  if symptoms less severe.   You must read complete instructions/literature along with all the possible adverse reactions/side effects for all the Medicines you take and that have been prescribed to you. Take any new Medicines after you have completely understood and accpet all the possible adverse reactions/side effects.    patient was instructed, not to drive, operate heavy machinery, perform activities at heights, swimming or participation in water activities or provide baby-sitting services while on Pain,  Sleep and Anxiety Medications; until their outpatient Physician has advised to do so again. Also recommended to not to take more than prescribed Pain, Sleep and Anxiety Medications.  It is not advisable to combine anxiety, sleep and pain medications without talking with your primary care provider.     Wear Seat belts while driving.   Please note: You were cared for by a hospitalist during  your hospital stay. Once you are discharged, your primary care physician will handle any further medical issues. Please note that NO REFILLS for any discharge medications will be authorized once you are discharged, as it is imperative that you return to your primary care physician (or establish a relationship with a primary care physician if you do not have one) for your post hospital discharge needs so that they can reassess your need for medications and monitor your lab values  Time coordinating discharge: Over 30 minutes  SIGNED:   Fredia Skeeter, MD  [facility] 10/04/2023, 10:54 AM *Please note that this is a verbal dictation therefore any spelling or grammatical errors are due to the Dragon [employer] One system interpretation. If 7PM-7AM, please contact night-coverage [email or web address]

## 2023-10-04 NOTE — Plan of Care (Signed)
  Problem: Education: Goal: Knowledge of General Education information will improve Description: Including pain rating scale, medication(s)/side effects and non-pharmacologic comfort measures Outcome: Progressing   Problem: Health Behavior/Discharge Planning: Goal: Ability to manage health-related needs will improve Outcome: Progressing   Problem: Activity: Goal: Risk for activity intolerance will decrease Outcome: Progressing   Problem: Nutrition: Goal: Adequate nutrition will be maintained Outcome: Progressing   Problem: Pain Managment: Goal: General experience of comfort will improve and/or be controlled Outcome: Progressing   Problem: Safety: Goal: Ability to remain free from injury will improve Outcome: Progressing

## 2023-10-05 DIAGNOSIS — E785 Hyperlipidemia, unspecified: Secondary | ICD-10-CM | POA: Diagnosis not present

## 2023-10-05 DIAGNOSIS — I10 Essential (primary) hypertension: Secondary | ICD-10-CM | POA: Diagnosis not present

## 2023-10-05 DIAGNOSIS — I426 Alcoholic cardiomyopathy: Secondary | ICD-10-CM | POA: Diagnosis not present

## 2023-10-05 DIAGNOSIS — I5022 Chronic systolic (congestive) heart failure: Secondary | ICD-10-CM | POA: Diagnosis not present

## 2023-10-09 DIAGNOSIS — F1010 Alcohol abuse, uncomplicated: Secondary | ICD-10-CM | POA: Diagnosis not present

## 2023-10-09 DIAGNOSIS — K862 Cyst of pancreas: Secondary | ICD-10-CM | POA: Diagnosis not present

## 2023-10-09 DIAGNOSIS — I509 Heart failure, unspecified: Secondary | ICD-10-CM | POA: Diagnosis not present

## 2023-10-12 ENCOUNTER — Ambulatory Visit

## 2023-10-16 DIAGNOSIS — I509 Heart failure, unspecified: Secondary | ICD-10-CM | POA: Diagnosis not present

## 2023-10-16 DIAGNOSIS — F322 Major depressive disorder, single episode, severe without psychotic features: Secondary | ICD-10-CM | POA: Diagnosis not present

## 2023-10-16 DIAGNOSIS — M549 Dorsalgia, unspecified: Secondary | ICD-10-CM | POA: Diagnosis not present

## 2023-10-16 DIAGNOSIS — J309 Allergic rhinitis, unspecified: Secondary | ICD-10-CM | POA: Diagnosis not present

## 2023-10-17 ENCOUNTER — Ambulatory Visit
Admission: RE | Admit: 2023-10-17 | Discharge: 2023-10-17 | Disposition: A | Discharge status: Home / Self Care | Source: Ambulatory Visit | Attending: Family Medicine | Admitting: Family Medicine

## 2023-10-17 DIAGNOSIS — Z1231 Encounter for screening mammogram for malignant neoplasm of breast: Secondary | ICD-10-CM

## 2023-10-25 NOTE — Progress Notes (Signed)
 This encounter was created in error - please disregard.

## 2023-10-31 ENCOUNTER — Encounter (HOSPITAL_COMMUNITY): Payer: Self-pay

## 2023-10-31 ENCOUNTER — Ambulatory Visit (HOSPITAL_COMMUNITY): Admit: 2023-10-31 | Admitting: Gastroenterology

## 2023-10-31 SURGERY — ULTRASOUND, UPPER GI TRACT, ENDOSCOPIC
Anesthesia: Monitor Anesthesia Care

## 2023-11-06 DIAGNOSIS — R799 Abnormal finding of blood chemistry, unspecified: Secondary | ICD-10-CM | POA: Diagnosis not present

## 2023-11-06 DIAGNOSIS — R7989 Other specified abnormal findings of blood chemistry: Secondary | ICD-10-CM | POA: Diagnosis not present

## 2023-11-07 ENCOUNTER — Other Ambulatory Visit: Payer: Self-pay | Admitting: Gastroenterology

## 2023-12-20 DIAGNOSIS — I426 Alcoholic cardiomyopathy: Secondary | ICD-10-CM | POA: Diagnosis not present

## 2023-12-20 DIAGNOSIS — E785 Hyperlipidemia, unspecified: Secondary | ICD-10-CM | POA: Diagnosis not present

## 2023-12-20 DIAGNOSIS — I5022 Chronic systolic (congestive) heart failure: Secondary | ICD-10-CM | POA: Diagnosis not present

## 2023-12-20 DIAGNOSIS — I10 Essential (primary) hypertension: Secondary | ICD-10-CM | POA: Diagnosis not present

## 2023-12-20 DIAGNOSIS — F1010 Alcohol abuse, uncomplicated: Secondary | ICD-10-CM | POA: Diagnosis not present

## 2024-01-09 NOTE — Progress Notes (Signed)
 Anesthesia Review:  PCP: Suzanne Jackson  Cardiologist : Badai LOV 12/20/23   PPM/ ICD: Device Orders: Rep Notified:  Chest x-ray : 10/02/23- 1 view  EKG : 10/04/23  Echo : 09/23/23  Stress test: Cardiac Cath :   Activity level:  Sleep Study/ CPAP : Fasting Blood Sugar :      / Checks Blood Sugar -- times a day:   Farxiga-   Eliquis  - last dose on   Blood Thinner/ Instructions /Last Dose: ASA / Instructions/ Last Dose :    Called pt on 01/09/2024.  PT wanted a call bak at another time.  Informed pt she would be called back at another time.

## 2024-01-13 ENCOUNTER — Other Ambulatory Visit: Payer: Self-pay

## 2024-01-13 ENCOUNTER — Inpatient Hospital Stay (HOSPITAL_COMMUNITY)
Admission: EM | Admit: 2024-01-13 | Discharge: 2024-01-17 | DRG: 438 | Disposition: A | Discharge status: Home / Self Care | Attending: Internal Medicine | Admitting: Internal Medicine

## 2024-01-13 ENCOUNTER — Encounter (HOSPITAL_COMMUNITY): Payer: Self-pay

## 2024-01-13 ENCOUNTER — Emergency Department (HOSPITAL_COMMUNITY)

## 2024-01-13 DIAGNOSIS — I110 Hypertensive heart disease with heart failure: Secondary | ICD-10-CM | POA: Diagnosis present

## 2024-01-13 DIAGNOSIS — Z1152 Encounter for screening for COVID-19: Secondary | ICD-10-CM

## 2024-01-13 DIAGNOSIS — I10 Essential (primary) hypertension: Secondary | ICD-10-CM | POA: Diagnosis present

## 2024-01-13 DIAGNOSIS — R7401 Elevation of levels of liver transaminase levels: Secondary | ICD-10-CM | POA: Diagnosis present

## 2024-01-13 DIAGNOSIS — K859 Acute pancreatitis without necrosis or infection, unspecified: Secondary | ICD-10-CM | POA: Diagnosis not present

## 2024-01-13 DIAGNOSIS — I5022 Chronic systolic (congestive) heart failure: Secondary | ICD-10-CM | POA: Diagnosis present

## 2024-01-13 DIAGNOSIS — Z9049 Acquired absence of other specified parts of digestive tract: Secondary | ICD-10-CM

## 2024-01-13 DIAGNOSIS — E872 Acidosis, unspecified: Secondary | ICD-10-CM | POA: Diagnosis present

## 2024-01-13 DIAGNOSIS — Z7901 Long term (current) use of anticoagulants: Secondary | ICD-10-CM

## 2024-01-13 DIAGNOSIS — Z981 Arthrodesis status: Secondary | ICD-10-CM

## 2024-01-13 DIAGNOSIS — K860 Alcohol-induced chronic pancreatitis: Secondary | ICD-10-CM | POA: Diagnosis present

## 2024-01-13 DIAGNOSIS — I420 Dilated cardiomyopathy: Secondary | ICD-10-CM | POA: Diagnosis present

## 2024-01-13 DIAGNOSIS — F32A Depression, unspecified: Secondary | ICD-10-CM | POA: Diagnosis present

## 2024-01-13 DIAGNOSIS — Z87442 Personal history of urinary calculi: Secondary | ICD-10-CM

## 2024-01-13 DIAGNOSIS — J189 Pneumonia, unspecified organism: Secondary | ICD-10-CM | POA: Diagnosis present

## 2024-01-13 DIAGNOSIS — Z87892 Personal history of anaphylaxis: Secondary | ICD-10-CM

## 2024-01-13 DIAGNOSIS — Z8619 Personal history of other infectious and parasitic diseases: Secondary | ICD-10-CM

## 2024-01-13 DIAGNOSIS — K869 Disease of pancreas, unspecified: Secondary | ICD-10-CM

## 2024-01-13 DIAGNOSIS — J449 Chronic obstructive pulmonary disease, unspecified: Secondary | ICD-10-CM | POA: Diagnosis present

## 2024-01-13 DIAGNOSIS — R21 Rash and other nonspecific skin eruption: Secondary | ICD-10-CM | POA: Diagnosis present

## 2024-01-13 DIAGNOSIS — F17210 Nicotine dependence, cigarettes, uncomplicated: Secondary | ICD-10-CM | POA: Diagnosis present

## 2024-01-13 DIAGNOSIS — K838 Other specified diseases of biliary tract: Secondary | ICD-10-CM | POA: Diagnosis present

## 2024-01-13 DIAGNOSIS — Z888 Allergy status to other drugs, medicaments and biological substances status: Secondary | ICD-10-CM

## 2024-01-13 DIAGNOSIS — Z86718 Personal history of other venous thrombosis and embolism: Secondary | ICD-10-CM

## 2024-01-13 DIAGNOSIS — Z79899 Other long term (current) drug therapy: Secondary | ICD-10-CM

## 2024-01-13 DIAGNOSIS — Z886 Allergy status to analgesic agent status: Secondary | ICD-10-CM

## 2024-01-13 DIAGNOSIS — K852 Alcohol induced acute pancreatitis without necrosis or infection: Principal | ICD-10-CM | POA: Diagnosis present

## 2024-01-13 DIAGNOSIS — K863 Pseudocyst of pancreas: Secondary | ICD-10-CM | POA: Diagnosis present

## 2024-01-13 DIAGNOSIS — I959 Hypotension, unspecified: Secondary | ICD-10-CM | POA: Diagnosis not present

## 2024-01-13 DIAGNOSIS — F064 Anxiety disorder due to known physiological condition: Secondary | ICD-10-CM | POA: Diagnosis present

## 2024-01-13 DIAGNOSIS — Z8249 Family history of ischemic heart disease and other diseases of the circulatory system: Secondary | ICD-10-CM

## 2024-01-13 DIAGNOSIS — Z885 Allergy status to narcotic agent status: Secondary | ICD-10-CM

## 2024-01-13 DIAGNOSIS — E860 Dehydration: Secondary | ICD-10-CM | POA: Diagnosis present

## 2024-01-13 DIAGNOSIS — Z7951 Long term (current) use of inhaled steroids: Secondary | ICD-10-CM

## 2024-01-13 DIAGNOSIS — J440 Chronic obstructive pulmonary disease with acute lower respiratory infection: Secondary | ICD-10-CM | POA: Diagnosis present

## 2024-01-13 DIAGNOSIS — F1010 Alcohol abuse, uncomplicated: Secondary | ICD-10-CM | POA: Diagnosis present

## 2024-01-13 LAB — CBC
HCT: 44.5 % (ref 36.0–46.0)
Hemoglobin: 14.4 g/dL (ref 12.0–15.0)
MCH: 30.3 pg (ref 26.0–34.0)
MCHC: 32.4 g/dL (ref 30.0–36.0)
MCV: 93.5 fL (ref 80.0–100.0)
Platelets: 321 K/uL (ref 150–400)
RBC: 4.76 MIL/uL (ref 3.87–5.11)
RDW: 18.8 % — ABNORMAL HIGH (ref 11.5–15.5)
WBC: 16.3 K/uL — ABNORMAL HIGH (ref 4.0–10.5)
nRBC: 0 % (ref 0.0–0.2)

## 2024-01-13 LAB — RESP PANEL BY RT-PCR (RSV, FLU A&B, COVID)  RVPGX2
Influenza A by PCR: NEGATIVE
Influenza B by PCR: NEGATIVE
Resp Syncytial Virus by PCR: NEGATIVE
SARS Coronavirus 2 by RT PCR: NEGATIVE

## 2024-01-13 LAB — COMPREHENSIVE METABOLIC PANEL WITH GFR
ALT: 13 U/L (ref 0–44)
AST: 27 U/L (ref 15–41)
Albumin: 4.3 g/dL (ref 3.5–5.0)
Alkaline Phosphatase: 125 U/L (ref 38–126)
Anion gap: 17 — ABNORMAL HIGH (ref 5–15)
BUN: 21 mg/dL — ABNORMAL HIGH (ref 6–20)
CO2: 20 mmol/L — ABNORMAL LOW (ref 22–32)
Calcium: 9.5 mg/dL (ref 8.9–10.3)
Chloride: 98 mmol/L (ref 98–111)
Creatinine, Ser: 1.05 mg/dL — ABNORMAL HIGH (ref 0.44–1.00)
GFR, Estimated: >60 mL/min
Glucose, Bld: 109 mg/dL — ABNORMAL HIGH (ref 70–99)
Potassium: 4.1 mmol/L (ref 3.5–5.1)
Sodium: 134 mmol/L — ABNORMAL LOW (ref 135–145)
Total Bilirubin: 0.9 mg/dL (ref 0.0–1.2)
Total Protein: 7.5 g/dL (ref 6.5–8.1)

## 2024-01-13 LAB — LIPASE, BLOOD: Lipase: >2800 U/L — ABNORMAL HIGH (ref 11–51)

## 2024-01-13 MED ORDER — ACETAMINOPHEN 650 MG RE SUPP: 650.0000 mg | Freq: Four times a day (QID) | RECTAL | Status: DC | PRN

## 2024-01-13 MED ORDER — DIPHENHYDRAMINE HCL 50 MG/ML IJ SOLN
12.5000 mg | Freq: Once | INTRAMUSCULAR | Status: AC
  Administered 2024-01-13: 12.5 mg via INTRAVENOUS
  Filled 2024-01-13: qty 1

## 2024-01-13 MED ORDER — ONDANSETRON HCL 4 MG/2ML IJ SOLN
4.0000 mg | Freq: Four times a day (QID) | INTRAMUSCULAR | Status: DC | PRN
  Administered 2024-01-13: 4 mg via INTRAVENOUS
  Filled 2024-01-13: qty 2

## 2024-01-13 MED ORDER — APIXABAN 5 MG PO TABS
5.0000 mg | ORAL_TABLET | Freq: Two times a day (BID) | ORAL | Status: DC
  Administered 2024-01-13 – 2024-01-17 (×8): 5 mg via ORAL
  Filled 2024-01-13 (×3): qty 1

## 2024-01-13 MED ORDER — SODIUM CHLORIDE 0.9 % IV SOLN
500.0000 mg | INTRAVENOUS | Status: DC
  Administered 2024-01-13 – 2024-01-16 (×4): 500 mg via INTRAVENOUS
  Filled 2024-01-13 (×2): qty 5

## 2024-01-13 MED ORDER — ALBUTEROL SULFATE (2.5 MG/3ML) 0.083% IN NEBU: 3.0000 mL | INHALATION_SOLUTION | Freq: Four times a day (QID) | RESPIRATORY_TRACT | Status: DC | PRN

## 2024-01-13 MED ORDER — DEXTROSE IN LACTATED RINGERS 5 % IV SOLN: INTRAVENOUS | Status: AC

## 2024-01-13 MED ORDER — LACTATED RINGERS IV BOLUS
1000.0000 mL | Freq: Once | INTRAVENOUS | Status: AC
  Administered 2024-01-13: 1000 mL via INTRAVENOUS

## 2024-01-13 MED ORDER — ONDANSETRON HCL 4 MG/2ML IJ SOLN
4.0000 mg | Freq: Once | INTRAMUSCULAR | Status: AC | PRN
  Administered 2024-01-13: 4 mg via INTRAVENOUS
  Filled 2024-01-13: qty 2

## 2024-01-13 MED ORDER — ACETAMINOPHEN 325 MG PO TABS: 650.0000 mg | ORAL_TABLET | Freq: Four times a day (QID) | ORAL | Status: DC | PRN

## 2024-01-13 MED ORDER — IOHEXOL 300 MG/ML  SOLN
80.0000 mL | Freq: Once | INTRAMUSCULAR | Status: AC | PRN
  Administered 2024-01-13: 80 mL via INTRAVENOUS

## 2024-01-13 MED ORDER — POLYETHYLENE GLYCOL 3350 17 G PO PACK: 17.0000 g | PACK | Freq: Every day | ORAL | Status: DC | PRN

## 2024-01-13 MED ORDER — ONDANSETRON HCL 4 MG PO TABS: 4.0000 mg | ORAL_TABLET | Freq: Four times a day (QID) | ORAL | Status: DC | PRN

## 2024-01-13 MED ORDER — SODIUM CHLORIDE 0.9 % IV BOLUS
500.0000 mL | Freq: Once | INTRAVENOUS | Status: AC
  Administered 2024-01-13: 500 mL via INTRAVENOUS

## 2024-01-13 MED ORDER — SODIUM CHLORIDE 0.9 % IV SOLN
2.0000 g | INTRAVENOUS | Status: DC
  Administered 2024-01-13 – 2024-01-16 (×4): 2 g via INTRAVENOUS
  Filled 2024-01-13 (×2): qty 20

## 2024-01-13 MED ORDER — HYDROMORPHONE HCL 1 MG/ML IJ SOLN
0.5000 mg | Freq: Once | INTRAMUSCULAR | Status: AC
  Administered 2024-01-13: 0.5 mg via INTRAVENOUS
  Filled 2024-01-13: qty 0.5

## 2024-01-13 MED ORDER — HYDROMORPHONE HCL 1 MG/ML IJ SOLN
1.0000 mg | INTRAMUSCULAR | Status: DC | PRN
  Administered 2024-01-13: 1 mg via INTRAVENOUS
  Filled 2024-01-13: qty 1

## 2024-01-13 MED ORDER — BUDESON-GLYCOPYRROL-FORMOTEROL 160-9-4.8 MCG/ACT IN AERO
2.0000 | INHALATION_SPRAY | Freq: Two times a day (BID) | RESPIRATORY_TRACT | Status: DC
  Administered 2024-01-13 – 2024-01-17 (×8): 2 via RESPIRATORY_TRACT
  Filled 2024-01-13 (×2): qty 5.9

## 2024-01-13 MED ORDER — HYDROMORPHONE HCL 1 MG/ML IJ SOLN
0.5000 mg | INTRAMUSCULAR | Status: DC | PRN
  Administered 2024-01-14 (×3): 0.5 mg via INTRAVENOUS
  Filled 2024-01-13 (×4): qty 0.5

## 2024-01-13 NOTE — H&P (Signed)
 " History and Physical    Suzanne Jackson FMW:[id number] DOB: 09/16/1970 DOA: 01/13/2024  PCP: Leonel Cole, MD   Patient coming from: Home  I have personally briefly reviewed patient's old medical records in [facility]  Chief Complaint: Abdominal pain  HPI: Suzanne Jackson is a 53 y.o. female with medical history significant for alcoholic pancreatitis, CHF, COPD hypertension. Patient presented to the ED with complaints of right lower quadrant abdominal pain that started yesterday.  She reports about 5 episodes of vomiting since onset, poor oral intake and normal bowel movement last bowel movement was today.  No urinary symptoms. She has chronic unchanged cough, no difficulty breathing.  She denies any aspiration episode with vomiting.  ED Course: Temperature 97.8.  Heart rate 69-91.  Respirate rate 19.  Blood pressure systolic 94-112. Lipase > 2800 WBC 16.3. COVID influenza RSV negative. CTAP WC- Complex multiloculated cystic mass in the region of the pancreatic head, minimally increased in size, with increased surrounding inflammatory stranding and fluid. Findings may be related to worsening infectious/inflammatory process and worsening acute pancreatitis. Cystic neoplasm of the pancreatic head not excluded. There is increasing pancreatic ductal and biliary ductal dilatation. Recommend EUS with possible FNA and surgical consultation.  Adjacent duodenal wall thickening.  New patchy ground glass opacities in both lungs. EDP talked to [facility] on-call Dr.Vreeland, stated the patient can stay up here. Likely would not get the EUS done with acute pancreatitis. 2.5 L bolus given. Hospitalist to admit for acute pancreatitis.  Review of Systems: As per HPI all other systems reviewed and negative.  Past Medical History:  Diagnosis Date   Alcohol  dependence with uncomplicated withdrawal (HCC) 06/15/2020   Alcoholic pancreatitis    Allergies    Arthritis    BACK    Asthma    Depression    Frequency of urination    History of Clostridium difficile colitis 07/10/2022   History of kidney stones    Hypertension    Leg pain    Left   Marijuana abuse 06/15/2020   Microhematuria    Right ureteral stone    Tobacco dependence 06/15/2020   Wears contact lenses     Past Surgical History:  Procedure Laterality Date   ANTERIOR LAT LUMBAR FUSION N/A 08/29/2017   Procedure: LUMBAR FOUR-FIVE LATERAL INTERBODY FUSION WITH INSTRUMENTATION AND ALLOGRAFT;  Surgeon: Beuford Anes, MD;  Location: [facility];  Service: Orthopedics;  Laterality: N/A;   APPLICATION OF WOUND VAC Right 06/09/2022   Procedure: APPLICATION OF WOUND VAC;  Surgeon: Silva Juliene SAUNDERS, DPM;  Location: [facility];  Service: Podiatry;  Laterality: Right;   CYSTO/  URETEROSCOPIC STONE EXTRACTION  1998   CYSTOSCOPY WITH RETROGRADE PYELOGRAM, URETEROSCOPY AND STENT PLACEMENT Right 02/12/2015   Procedure: CYSTOSCOPY WITH RETROGRADE PYELOGRAM, URETEROSCOPY;  Surgeon: Arlena Gal, MD;  Location: [facility];  Service: Urology;  Laterality: Right;   DILITATION & CURRETTAGE/HYSTROSCOPY WITH NOVASURE ABLATION N/A 12/05/2017   Procedure: DILATATION & CURETTAGE/HYSTEROSCOPY WITH NOVASURE ABLATION;  Surgeon: Ozan, Jennifer, DO;  Location: [facility];  Service: Gynecology;  Laterality: N/A;   EXTRACORPOREAL SHOCK WAVE LITHOTRIPSY Right 08/08/2018   Procedure: EXTRACORPOREAL SHOCK WAVE LITHOTRIPSY (ESWL);  Surgeon: Renda Glance, MD;  Location: [facility];  Service: Urology;  Laterality: Right;   GRAFT APPLICATION Right 06/09/2022   Procedure: GRAFT APPLICATION;  Surgeon: Silva Juliene SAUNDERS, DPM;  Location: [facility];  Service: Podiatry;  Laterality: Right;   LAPAROSCOPIC CHOLECYSTECTOMY  1994   LAPAROSCOPIC TUBAL LIGATION Bilateral  05-06-2009   fulgeration   POSTERIOR FUSION LUMBAR SPINE  10-28-2008   L5 - S1   STONE EXTRACTION WITH BASKET Right 02/12/2015   Procedure: STONE EXTRACTION WITH  BASKET AND LASER LITHOTRIPSY;  Surgeon: Arlena Gal, MD;  Location: [facility];  Service: Urology;  Laterality: Right;   TUBAL LIGATION     WOUND DEBRIDEMENT Right 06/09/2022   Procedure: DEBRIDEMENT WOUND;  Surgeon: Silva Juliene SAUNDERS, DPM;  Location: [facility];  Service: Podiatry;  Laterality: Right;     reports that she has been smoking cigarettes and e-cigarettes. She started smoking about 39 years ago. She has a 58.5 pack-year smoking history. She has never used smokeless tobacco. She reports current alcohol  use of about 4.0 - 5.0 standard drinks of alcohol  per week. She reports that she does not currently use drugs after having used the following drugs: Marijuana.  Allergies[1]  Family History  Problem Relation Age of Onset   Pancreatitis Neg Hx    Family history of hypertension.  Prior to Admission medications  Medication Sig Start Date End Date Taking? Authorizing Provider  albuterol  (PROVENTIL ) (2.5 MG/3ML) 0.083% nebulizer solution Take 3 mLs by nebulization every 6 (six) hours as needed for wheezing or shortness of breath. 05/14/23   [provider]  albuterol  (VENTOLIN  HFA) 108 (90 Base) MCG/ACT inhaler Inhale 2 puffs into the lungs 2 (two) times daily. 03/15/23   [provider]  DULoxetine  (CYMBALTA ) 60 MG capsule Take 60 mg by mouth daily. 09/18/19   [provider]  ELIQUIS  5 MG TABS tablet Take 5 mg by mouth 2 (two) times daily.    [provider]  FARXIGA 10 MG TABS tablet Take 10 mg by mouth daily.    [provider]  fexofenadine (ALLEGRA) 180 MG tablet Take 180 mg by mouth daily.    [provider]  folic acid  (FOLVITE ) 1 MG tablet Take 1 tablet (1 mg total) by mouth daily. 09/24/23 09/23/24  Jillian Buttery, MD  furosemide  (LASIX ) 20 MG tablet Take 1 tablet (20 mg total) by mouth daily. Patient taking differently: Take 20 mg by mouth every other day. 07/24/23   Barrett, Jamie N, PA-C   HYDROcodone -acetaminophen  (NORCO) 7.5-325 MG tablet Take 1 tablet by mouth every four to six hours as needed for pain 10/04/23   Vernon Ranks, MD  hydrOXYzine  (ATARAX /VISTARIL ) 50 MG tablet Take 1 tablet (50 mg total) by mouth every 6 (six) hours as needed (refractory itching). 08/31/17   McKenzie, Kayla J, PA-C  ipratropium-albuterol  (DUONEB) 0.5-2.5 (3) MG/3ML SOLN Take 3 mLs by nebulization every 6 (six) hours as needed (for shortness of breath or wheezing).    [provider]  losartan (COZAAR) 25 MG tablet Take 25 mg by mouth daily. 08/09/23   [provider]  metoprolol  succinate (TOPROL -XL) 50 MG 24 hr tablet Take 50 mg by mouth daily.    [provider]  montelukast  (SINGULAIR ) 10 MG tablet Take 10 mg by mouth at bedtime. 11/21/12   [provider]  nicotine  (NICODERM CQ  - DOSED IN MG/24 HOURS) 14 mg/24hr patch Place 1 patch (14 mg total) onto the skin daily. Patient not taking: Reported on 10/03/2023 09/24/23   Jillian Buttery, MD  pantoprazole  (PROTONIX ) 40 MG tablet Take 40 mg by mouth daily before breakfast.    [provider]  potassium chloride  SA (K-DUR,KLOR-CON ) 20 MEQ tablet Take 20 mEq by mouth See admin instructions. Take 20 mEq by mouth in the morning and evening  [provider]  rosuvastatin  (CRESTOR ) 5 MG tablet Take 5 mg by mouth at bedtime. 05/28/23   [provider]  thiamine  (VITAMIN B1) 100 MG tablet Take 1 tablet (100 mg total) by mouth daily. 09/24/23   Jillian Buttery, MD  TRELEGY ELLIPTA 100-62.5-25 MCG/ACT AEPB Take 1 puff by mouth daily.    [provider]  TYLENOL  8 HOUR 650 MG CR tablet Take 650-1,300 mg by mouth every 8 (eight) hours as needed for pain.    [provider]  Vitamin D, Ergocalciferol, (DRISDOL) 1.25 MG (50000 UNIT) CAPS capsule Take 50,000 Units by mouth once a week. 09/19/23   [provider]    Physical Exam: Vitals:   01/13/24 2030 01/13/24 2045 01/13/24 2100  01/13/24 2115  BP: (!) 104/53 (!) 94/50 93/81 104/78  Pulse: 74 79 77 69  Resp:      Temp:      TempSrc:      SpO2: 97% 98% 100% 97%  Weight:      Height:        Constitutional: NAD, calm, comfortable Vitals:   01/13/24 2030 01/13/24 2045 01/13/24 2100 01/13/24 2115  BP: (!) 104/53 (!) 94/50 93/81 104/78  Pulse: 74 79 77 69  Resp:      Temp:      TempSrc:      SpO2: 97% 98% 100% 97%  Weight:      Height:       Eyes: PERRL, lids and conjunctivae normal ENMT: Mucous membranes are moist. Posterior pharynx clear of any exudate or lesions.Normal dentition.  Neck: normal, supple, no masses, no thyromegaly Respiratory: clear to auscultation bilaterally, no wheezing, no crackles. Normal respiratory effort. No accessory muscle use.  Cardiovascular: Regular rate and rhythm, no murmurs / rubs / gallops. No extremity edema.  Extremities warm. Abdomen: Moderate diffuse abdominal tenderness with guarding, no masses palpated. No hepatosplenomegaly.   Musculoskeletal: no clubbing / cyanosis. No joint deformity upper and lower extremities.  Skin: Reports 2 small white patchy areas to right forearm that she noticed while here in the ED, no rashes, lesions, ulcers. No induration Neurologic: No facial asymmetry, management to spontaneously, speech fluent. Psychiatric: Normal judgment and insight. Alert and oriented x 3. Normal mood.   Labs on Admission: I have personally reviewed following labs and imaging studies  CBC: Recent Labs  Lab 01/13/24 1816  WBC 16.3*  HGB 14.4  HCT 44.5  MCV 93.5  PLT 321   Basic Metabolic Panel: Recent Labs  Lab 01/13/24 1816  NA 134*  K 4.1  CL 98  CO2 20*  GLUCOSE 109*  BUN 21*  CREATININE 1.05*  CALCIUM  9.5   GFR: Estimated Creatinine Clearance: 46.2 mL/min (A) (by C-G formula based on SCr of 1.05 mg/dL (H)). Liver Function Tests: Recent Labs  Lab 01/13/24 1816  AST 27  ALT 13  ALKPHOS 125  BILITOT 0.9  PROT 7.5  ALBUMIN 4.3   Recent  Labs  Lab 01/13/24 1816  LIPASE >2,800*   Urine analysis:    Component Value Date/Time   COLORURINE YELLOW 10/02/2023 1522   APPEARANCEUR HAZY (A) 10/02/2023 1522   LABSPEC 1.024 10/02/2023 1522   PHURINE 5.0 10/02/2023 1522   GLUCOSEU >=500 (A) 10/02/2023 1522   HGBUR NEGATIVE 10/02/2023 1522   BILIRUBINUR NEGATIVE 10/02/2023 1522   KETONESUR 20 (A) 10/02/2023 1522   PROTEINUR NEGATIVE 10/02/2023 1522   UROBILINOGEN 1.0 10/28/2008 1106   NITRITE NEGATIVE 10/02/2023 1522   LEUKOCYTESUR NEGATIVE 10/02/2023 1522  Radiological Exams on Admission: CT ABDOMEN PELVIS W CONTRAST Result Date: 01/13/2024 EXAM: CT ABDOMEN AND PELVIS WITH CONTRAST 01/13/2024 07:47:19 PM TECHNIQUE: CT of the abdomen and pelvis was performed with the administration of 80 mL of iohexol  (OMNIPAQUE ) 300 MG/ML solution. Multiplanar reformatted images are provided for review. Automated exposure control, iterative reconstruction, and/or weight-based adjustment of the mA/kV was utilized to reduce the radiation dose to as low as reasonably achievable. COMPARISON: CT abdomen and pelvis 10/02/2023. CLINICAL HISTORY: RLQ abdominal pain. FINDINGS: LOWER CHEST: There are new patchy ground glass opacities in both lung bases. LIVER: The liver is mildly enlarged. GALLBLADDER AND BILE DUCTS: There is intrahepatic and extrahepatic biliary ductal dilatation which has increased from prior study. SPLEEN: No acute abnormality. PANCREAS: Complex multiloculated cystic mass is seen in the region of the pancreatic head measuring 3.5 x 5.9 cm, minimally increased in size. There is surrounding inflammatory stranding and fluid which has increased. There is new mild pancreatic ductal dilatation in the body of tail of the pancreas. ADRENAL GLANDS: No acute abnormality. KIDNEYS, URETERS AND BLADDER: There is a subcentimeter hypodensity in the left kidney which is too small to characterize, likely a cyst. There is scarring in the superior pole of the  left kidney. No stones in the kidneys or ureters. No hydronephrosis. No perinephric or periureteral stranding. Urinary bladder is unremarkable. GI AND BOWEL: There is wall thickening and inflammation involving the proximal transverse colon abutting inflammatory fluid related to the pancreas. There is some new wall thickening of the duodenum. The appendix is not visualized. Stomach demonstrates no acute abnormality. There is no bowel obstruction. PERITONEUM AND RETROPERITONEUM: There is a new small amount of free fluid in the pelvis. No free air. VASCULATURE: Aorta is normal in caliber. There are atherosclerotic calcifications of the aorta and iliac arteries. LYMPH NODES: No lymphadenopathy. REPRODUCTIVE ORGANS: No acute abnormality. BONES AND SOFT TISSUES: Lower lumbar fusion hardware is unchanged. There is a small fat containing umbilical hernia. No acute osseous abnormality. No focal soft tissue abnormality. IMPRESSION: 1. Complex multiloculated cystic mass in the region of the pancreatic head, minimally increased in size, with increased surrounding inflammatory stranding and fluid. Findings may be related to worsening infectious/inflammatory process and worsening acute pancreatitis. Cystic neoplasm of the pancreatic head not excluded. There is increasing pancreatic ductal and biliary ductal dilatation. Recommend EUS with possible FNA and surgical consultation. 2. Wall thickening and inflammation involving the proximal transverse colon abutting inflammatory fluid related to the pancreas, with new wall thickening of the duodenum. 3. New patchy ground glass opacities in both lung bases. Electronically signed by: Greig Pique MD 01/13/2024 08:28 PM EST RP Workstation: [id number]   EKG: None.   Assessment/Plan Principal Problem:   Acute pancreatitis Active Problems:   Hypertension   Depression   Chronic HFrEF (heart failure with reduced ejection fraction) (HCC)   COPD (chronic obstructive pulmonary disease)  (HCC)   Assessment and Plan:  Acute pancreatitis-significantly elevated lipase > 2800.  WBC 16.3.  CTAP WC-complex cystic mass in the pancreatic head minimally increased in size, surrounding stranding and fluid, worsening infectious/inflammatory process and worsening acute pancreatitis.  Cystic neoplasm of pancreatic head not excluded.  Pancreatic duct and biliary duct dilation.  EUS, possible FNA and surgical consultation recommended. -Patient follows with [facility], Dr. Dr. Kriss, stated the patient can stay up here. Likely would not get the EUS done with acute pancreatitis. - GI eval in a.m  - N.p.o. - IV Dilaudid  0.5 mg every 4 hrly PRN  -  2.5 L bolus given, continue D5 LR 50cc/hr x 1 day, (Hydrate gently- considering severely reduced EF) - Zofran  as needed - Hospitalized 09/2023 also for acute pancreatitis, plan was for outpatient EUS, after acute pancreatitis has healed.  She has EUS scheduled for 01/23/2024 with Dr. Burnette.  Possible pneumonia- she denies respiratory symptoms at this time, CT AP WC shows new patchy ground glass opacities in both lungs.  With leukocytosis of 16.3 which may be 2/2 acute pancreatitis. -IV ceftriaxone  and azithromycin  - EKG- check Qtc  History of alcohol  abuse-reports she and her husband have quit drinking alcohol -last alcoholic drink was in September this year. - Thiamine  folate multivitamins when able to take orally  Mild anion gap metabolic acidosis-serum bicarb of 20, anion gap of 17.  In setting of acute pancreatitis, dehydration.  CBG 119. - Hydrate, trend  ? Rash-reports 2 small white patchy areas to right forearm that started here in the ED. Dilaudid  and Zofran  given in ED.  At this time no culprit medications identified. - Monitor for now  Hypertension-pressure soft systolic down to 94. -Hold Lasix  20 mg , losartan 25 mg daily, metoprolol  50 mg daily  Systolic CHF- stable and compensated, appears dehydrated.  Last echo 09/2025 EF of less than  20%. - Hold Lasix  20 mg every 48 hours -She has gotten 2.5 L so far, continue hydration gently  Hx of ventricular thrombus - Resume Eliquis   DVT prophylaxis: ELiquis  Code Status: FULL Family Communication: SPouse at bedside Disposition Plan: ~ /> 2 days Consults called: GI Admission status: Into tele  I certify that at the point of admission it is my clinical judgment that the patient will require inpatient hospital care spanning beyond 2 midnights from the point of admission due to high intensity of service, high risk for further deterioration and high frequency of surveillance required.    Author: Tully FORBES Carwin, MD 01/13/2024 10:51 PM  For on call review [email or web address].     [1]  Allergies Allergen Reactions   Aleve [Naproxen] Anaphylaxis        Aspirin Anaphylaxis and Shortness Of Breath   Ibuprofen Anaphylaxis   Oxycodone -Acetaminophen  Itching and Other (See Comments)    Pt states okay to take with hydroxyzine    Bupropion Other (See Comments)    Anger   "

## 2024-01-13 NOTE — ED Triage Notes (Addendum)
 Pt presents with RLQ abd pain with N/V, chills. Pain is worse with movement. Denies any diarrhea, constipation, or blood in emesis or stool. Last BM was today and described as normal. Pt took a hydrocodone  without relief at 16:00. Pt has been in contact with a grandson that was flu positive.

## 2024-01-13 NOTE — ED Provider Notes (Signed)
 " [place] EMERGENCY DEPARTMENT AT [facility] Provider Note   CSN: [id number] Arrival date & time: 01/13/24  1752     Patient presents with: Abdominal Pain   Suzanne Jackson is a 53 y.o. female.    Abdominal Pain Presents abdominal pain been present for last couple days.  Vomiting.  History of pancreatitis and states this feels the same also on right lower quadrant.  No fevers.  Former drinker but has not drank since September    Past Medical History:  Diagnosis Date   Alcohol  dependence with uncomplicated withdrawal (HCC) 06/15/2020   Alcoholic pancreatitis    Allergies    Arthritis    BACK   Asthma    Depression    Frequency of urination    History of Clostridium difficile colitis 07/10/2022   History of kidney stones    Hypertension    Leg pain    Left   Marijuana abuse 06/15/2020   Microhematuria    Right ureteral stone    Tobacco dependence 06/15/2020   Wears contact lenses     Prior to Admission medications  Medication Sig Start Date End Date Taking? Authorizing Provider  albuterol  (PROVENTIL ) (2.5 MG/3ML) 0.083% nebulizer solution Take 3 mLs by nebulization every 6 (six) hours as needed for wheezing or shortness of breath. 05/14/23   [provider]  albuterol  (VENTOLIN  HFA) 108 (90 Base) MCG/ACT inhaler Inhale 2 puffs into the lungs 2 (two) times daily. 03/15/23   [provider]  DULoxetine  (CYMBALTA ) 60 MG capsule Take 60 mg by mouth daily. 09/18/19   [provider]  ELIQUIS  5 MG TABS tablet Take 5 mg by mouth 2 (two) times daily.    [provider]  FARXIGA 10 MG TABS tablet Take 10 mg by mouth daily.    [provider]  fexofenadine (ALLEGRA) 180 MG tablet Take 180 mg by mouth daily.    [provider]  folic acid  (FOLVITE ) 1 MG tablet Take 1 tablet (1 mg total) by mouth daily. 09/24/23 09/23/24  Jillian Buttery, MD  furosemide  (LASIX ) 20 MG tablet Take 1 tablet (20 mg total) by mouth  daily. Patient taking differently: Take 20 mg by mouth every other day. 07/24/23   Barrett, Jamie N, PA-C  HYDROcodone -acetaminophen  (NORCO) 7.5-325 MG tablet Take 1 tablet by mouth every four to six hours as needed for pain 10/04/23   Vernon Ranks, MD  hydrOXYzine  (ATARAX /VISTARIL ) 50 MG tablet Take 1 tablet (50 mg total) by mouth every 6 (six) hours as needed (refractory itching). 08/31/17   McKenzie, Kayla J, PA-C  ipratropium-albuterol  (DUONEB) 0.5-2.5 (3) MG/3ML SOLN Take 3 mLs by nebulization every 6 (six) hours as needed (for shortness of breath or wheezing).    [provider]  losartan (COZAAR) 25 MG tablet Take 25 mg by mouth daily. 08/09/23   [provider]  metoprolol  succinate (TOPROL -XL) 50 MG 24 hr tablet Take 50 mg by mouth daily.    [provider]  montelukast  (SINGULAIR ) 10 MG tablet Take 10 mg by mouth at bedtime. 11/21/12   [provider]  nicotine  (NICODERM CQ  - DOSED IN MG/24 HOURS) 14 mg/24hr patch Place 1 patch (14 mg total) onto the skin daily. Patient not taking: Reported on 10/03/2023 09/24/23   Jillian Buttery, MD  pantoprazole  (PROTONIX ) 40 MG tablet Take 40 mg by mouth daily before breakfast.    [provider]  potassium chloride  SA (K-DUR,KLOR-CON ) 20 MEQ tablet Take 20 mEq by mouth See  admin instructions. Take 20 mEq by mouth in the morning and evening    [provider]  rosuvastatin  (CRESTOR ) 5 MG tablet Take 5 mg by mouth at bedtime. 05/28/23   [provider]  thiamine  (VITAMIN B1) 100 MG tablet Take 1 tablet (100 mg total) by mouth daily. 09/24/23   Jillian Buttery, MD  TRELEGY ELLIPTA 100-62.5-25 MCG/ACT AEPB Take 1 puff by mouth daily.    [provider]  TYLENOL  8 HOUR 650 MG CR tablet Take 650-1,300 mg by mouth every 8 (eight) hours as needed for pain.    [provider]  Vitamin D, Ergocalciferol, (DRISDOL) 1.25 MG (50000 UNIT) CAPS capsule Take 50,000 Units by mouth once a week. 09/19/23    [provider]    Allergies: Aleve [naproxen], Aspirin, Ibuprofen, Oxycodone -acetaminophen , and Bupropion    Review of Systems  Gastrointestinal:  Positive for abdominal pain.    Updated Vital Signs BP (!) 104/53   Pulse 74   Temp 97.8 F (36.6 C) (Oral)   Resp 19   Ht 5' 1 (1.549 m)   Wt 53.5 kg   SpO2 97%   BMI 22.30 kg/m   Physical Exam Vitals reviewed.  Abdominal:     Tenderness: There is abdominal tenderness.     Comments: Diffuse abdominal tenderness without rebound or guarding.  No hernia palpated.  Neurological:     Mental Status: She is alert.     (all labs ordered are listed, but only abnormal results are displayed) Labs Reviewed  LIPASE, BLOOD - Abnormal; Notable for the following components:      Result Value   Lipase >2,800 (*)    All other components within normal limits  COMPREHENSIVE METABOLIC PANEL WITH GFR - Abnormal; Notable for the following components:   Sodium 134 (*)    CO2 20 (*)    Glucose, Bld 109 (*)    BUN 21 (*)    Creatinine, Ser 1.05 (*)    Anion gap 17 (*)    All other components within normal limits  CBC - Abnormal; Notable for the following components:   WBC 16.3 (*)    RDW 18.8 (*)    All other components within normal limits  RESP PANEL BY RT-PCR (RSV, FLU A&B, COVID)  [id number]  URINALYSIS, ROUTINE W REFLEX MICROSCOPIC    EKG: None  Radiology: CT ABDOMEN PELVIS W CONTRAST Result Date: 01/13/2024 EXAM: CT ABDOMEN AND PELVIS WITH CONTRAST 01/13/2024 07:47:19 PM TECHNIQUE: CT of the abdomen and pelvis was performed with the administration of 80 mL of iohexol  (OMNIPAQUE ) 300 MG/ML solution. Multiplanar reformatted images are provided for review. Automated exposure control, iterative reconstruction, and/or weight-based adjustment of the mA/kV was utilized to reduce the radiation dose to as low as reasonably achievable. COMPARISON: CT abdomen and pelvis 10/02/2023. CLINICAL HISTORY: RLQ abdominal pain. FINDINGS: LOWER  CHEST: There are new patchy ground glass opacities in both lung bases. LIVER: The liver is mildly enlarged. GALLBLADDER AND BILE DUCTS: There is intrahepatic and extrahepatic biliary ductal dilatation which has increased from prior study. SPLEEN: No acute abnormality. PANCREAS: Complex multiloculated cystic mass is seen in the region of the pancreatic head measuring 3.5 x 5.9 cm, minimally increased in size. There is surrounding inflammatory stranding and fluid which has increased. There is new mild pancreatic ductal dilatation in the body of tail of the pancreas. ADRENAL GLANDS: No acute abnormality. KIDNEYS, URETERS AND BLADDER: There is a subcentimeter hypodensity in the left kidney which is too small to  characterize, likely a cyst. There is scarring in the superior pole of the left kidney. No stones in the kidneys or ureters. No hydronephrosis. No perinephric or periureteral stranding. Urinary bladder is unremarkable. GI AND BOWEL: There is wall thickening and inflammation involving the proximal transverse colon abutting inflammatory fluid related to the pancreas. There is some new wall thickening of the duodenum. The appendix is not visualized. Stomach demonstrates no acute abnormality. There is no bowel obstruction. PERITONEUM AND RETROPERITONEUM: There is a new small amount of free fluid in the pelvis. No free air. VASCULATURE: Aorta is normal in caliber. There are atherosclerotic calcifications of the aorta and iliac arteries. LYMPH NODES: No lymphadenopathy. REPRODUCTIVE ORGANS: No acute abnormality. BONES AND SOFT TISSUES: Lower lumbar fusion hardware is unchanged. There is a small fat containing umbilical hernia. No acute osseous abnormality. No focal soft tissue abnormality. IMPRESSION: 1. Complex multiloculated cystic mass in the region of the pancreatic head, minimally increased in size, with increased surrounding inflammatory stranding and fluid. Findings may be related to worsening  infectious/inflammatory process and worsening acute pancreatitis. Cystic neoplasm of the pancreatic head not excluded. There is increasing pancreatic ductal and biliary ductal dilatation. Recommend EUS with possible FNA and surgical consultation. 2. Wall thickening and inflammation involving the proximal transverse colon abutting inflammatory fluid related to the pancreas, with new wall thickening of the duodenum. 3. New patchy ground glass opacities in both lung bases. Electronically signed by: Greig Pique MD 01/13/2024 08:28 PM EST RP Workstation: [id number]     Procedures   Medications Ordered in the ED  lactated ringers  bolus 1,000 mL (has no administration in time range)  ondansetron  (ZOFRAN ) injection 4 mg (4 mg Intravenous Given 01/13/24 1902)  HYDROmorphone  (DILAUDID ) injection 0.5 mg (0.5 mg Intravenous Given 01/13/24 1902)  sodium chloride  0.9 % bolus 500 mL (0 mLs Intravenous Stopped 01/13/24 2005)  diphenhydrAMINE  (BENADRYL ) injection 12.5 mg (12.5 mg Intravenous Given 01/13/24 1859)  iohexol  (OMNIPAQUE ) 300 MG/ML solution 80 mL (80 mLs Intravenous Contrast Given 01/13/24 1939)  lactated ringers  bolus 1,000 mL (1,000 mLs Intravenous New Bag/Given 01/13/24 2014)                                    Medical Decision Making Amount and/or Complexity of Data Reviewed Labs: ordered. Radiology: ordered.  Risk Prescription drug management. Decision regarding hospitalization.   Patient abdominal pain.  Differential diagnosis does include pancreatitis, appendicitis, obstruction.  Although she has had chronic issues with her pancreas.  Lipase elevated which makes pancreatitis much more likely.  CT scan done and does show worsening changes of her pancreatitis including cyst.  He does not have a fever.  However I think will require admission for pain control and fluids.  Will discuss with hospitalist.      Final diagnoses:  Acute pancreatitis, unspecified complication status,  unspecified pancreatitis type    ED Discharge Orders     None          Patsey Lot, MD 01/13/24 2038  "

## 2024-01-14 ENCOUNTER — Inpatient Hospital Stay (HOSPITAL_COMMUNITY)

## 2024-01-14 ENCOUNTER — Telehealth (HOSPITAL_COMMUNITY): Payer: Self-pay | Admitting: Pharmacy Technician

## 2024-01-14 ENCOUNTER — Other Ambulatory Visit (HOSPITAL_COMMUNITY): Payer: Self-pay

## 2024-01-14 DIAGNOSIS — K852 Alcohol induced acute pancreatitis without necrosis or infection: Secondary | ICD-10-CM | POA: Diagnosis not present

## 2024-01-14 DIAGNOSIS — I5022 Chronic systolic (congestive) heart failure: Secondary | ICD-10-CM | POA: Diagnosis not present

## 2024-01-14 DIAGNOSIS — I10 Essential (primary) hypertension: Secondary | ICD-10-CM | POA: Diagnosis not present

## 2024-01-14 DIAGNOSIS — K8521 Alcohol induced acute pancreatitis with uninfected necrosis: Secondary | ICD-10-CM

## 2024-01-14 DIAGNOSIS — J449 Chronic obstructive pulmonary disease, unspecified: Secondary | ICD-10-CM | POA: Diagnosis not present

## 2024-01-14 LAB — CBC
HCT: 40.9 % (ref 36.0–46.0)
Hemoglobin: 13.1 g/dL (ref 12.0–15.0)
MCH: 30.7 pg (ref 26.0–34.0)
MCHC: 32 g/dL (ref 30.0–36.0)
MCV: 95.8 fL (ref 80.0–100.0)
Platelets: 263 K/uL (ref 150–400)
RBC: 4.27 MIL/uL (ref 3.87–5.11)
RDW: 18.8 % — ABNORMAL HIGH (ref 11.5–15.5)
WBC: 19.3 K/uL — ABNORMAL HIGH (ref 4.0–10.5)
nRBC: 0 % (ref 0.0–0.2)

## 2024-01-14 LAB — URINALYSIS, ROUTINE W REFLEX MICROSCOPIC
Bacteria, UA: NONE SEEN
Bilirubin Urine: NEGATIVE
Glucose, UA: >=500 mg/dL — AB
Hgb urine dipstick: NEGATIVE
Ketones, ur: 20 mg/dL — AB
Leukocytes,Ua: NEGATIVE
Nitrite: NEGATIVE
Protein, ur: 100 mg/dL — AB
Specific Gravity, Urine: 1.038 — ABNORMAL HIGH (ref 1.005–1.030)
pH: 5 (ref 5.0–8.0)

## 2024-01-14 LAB — COMPREHENSIVE METABOLIC PANEL WITH GFR
ALT: 38 U/L (ref 0–44)
AST: 52 U/L — ABNORMAL HIGH (ref 15–41)
Albumin: 3.7 g/dL (ref 3.5–5.0)
Alkaline Phosphatase: 129 U/L — ABNORMAL HIGH (ref 38–126)
Anion gap: 10 (ref 5–15)
BUN: 13 mg/dL (ref 6–20)
CO2: 26 mmol/L (ref 22–32)
Calcium: 9.3 mg/dL (ref 8.9–10.3)
Chloride: 100 mmol/L (ref 98–111)
Creatinine, Ser: 0.68 mg/dL (ref 0.44–1.00)
GFR, Estimated: >60 mL/min
Glucose, Bld: 112 mg/dL — ABNORMAL HIGH (ref 70–99)
Potassium: 4.3 mmol/L (ref 3.5–5.1)
Sodium: 136 mmol/L (ref 135–145)
Total Bilirubin: 0.7 mg/dL (ref 0.0–1.2)
Total Protein: 6.7 g/dL (ref 6.5–8.1)

## 2024-01-14 LAB — CBG MONITORING, ED
Glucose-Capillary: 116 mg/dL — ABNORMAL HIGH (ref 70–99)
Glucose-Capillary: 97 mg/dL (ref 70–99)

## 2024-01-14 LAB — GLUCOSE, CAPILLARY: Glucose-Capillary: 124 mg/dL — ABNORMAL HIGH (ref 70–99)

## 2024-01-14 MED ORDER — DIPHENHYDRAMINE HCL 50 MG/ML IJ SOLN
25.0000 mg | Freq: Three times a day (TID) | INTRAMUSCULAR | Status: DC | PRN
  Administered 2024-01-14 – 2024-01-16 (×7): 25 mg via INTRAVENOUS
  Filled 2024-01-14 (×7): qty 1

## 2024-01-14 MED ORDER — HYDROMORPHONE HCL 1 MG/ML IJ SOLN
0.5000 mg | Freq: Once | INTRAMUSCULAR | Status: AC
  Administered 2024-01-14: 0.5 mg via INTRAVENOUS

## 2024-01-14 MED ORDER — HYDROXYZINE HCL 25 MG PO TABS
25.0000 mg | ORAL_TABLET | Freq: Three times a day (TID) | ORAL | Status: AC
  Administered 2024-01-14 – 2024-01-17 (×9): 25 mg via ORAL
  Filled 2024-01-14 (×9): qty 1

## 2024-01-14 MED ORDER — HYDROMORPHONE HCL 1 MG/ML IJ SOLN
1.0000 mg | INTRAMUSCULAR | Status: DC | PRN
  Administered 2024-01-14 – 2024-01-17 (×16): 1 mg via INTRAVENOUS
  Filled 2024-01-14 (×16): qty 1

## 2024-01-14 MED ORDER — HYDROMORPHONE HCL 1 MG/ML IJ SOLN
0.5000 mg | Freq: Once | INTRAMUSCULAR | Status: AC
  Administered 2024-01-14: 0.5 mg via INTRAVENOUS
  Filled 2024-01-14: qty 0.5

## 2024-01-14 NOTE — Plan of Care (Signed)
   Problem: Activity: Goal: Risk for activity intolerance will decrease Outcome: Progressing   Problem: Coping: Goal: Level of anxiety will decrease Outcome: Progressing

## 2024-01-14 NOTE — ED Notes (Signed)
 Pt still in severe pain despite her q 4 PRN dilaudid  order. This RN  requested assistance from EDP Dr Midge for pain med order. He ordered a one time dose for 0.5mg  dilaudid . Kellogg RN

## 2024-01-14 NOTE — TOC Initial Note (Signed)
 Transition of Care [facility]) - Initial/Assessment Note    Patient Details  Name: Suzanne Jackson MRN: [id number] Date of Birth: July 15, 1970  [facility]) CM/SW Contact:    Mcarthur Saddie Kim, LCSW Phone Number: 01/14/2024, 8:24 AM  Clinical Narrative: Pt admitted with acute pancreatitis. Assessment completed due to high risk readmission score. Pt lives with her husband and son. She is independent with ADLs. No home health prior to admission. Pt has transportation to appointments. Plan is for return home when medically stable. TOC will follow.                   Expected Discharge Plan: Home/Self Care Barriers to Discharge: Continued Medical Work up   Patient Goals and CMS Choice Patient states their goals for this hospitalization and ongoing recovery are:: return home   Choice offered to / list presented to : Spouse [place] ownership interest in [facility].provided to::  (n/a)    Expected Discharge Plan and Services In-house Referral: Clinical Social Work     Living arrangements for the past 2 months: Single Family Home                                      Prior Living Arrangements/Services Living arrangements for the past 2 months: Single Family Home Lives with:: Spouse Patient language and need for interpreter reviewed:: Yes Do you feel safe going back to the place where you live?: Yes      Need for Family Participation in Patient Care: No (Comment)     Criminal Activity/Legal Involvement Pertinent to Current Situation/Hospitalization: No - Comment as needed  Activities of Daily Living      Permission Sought/Granted                  Emotional Assessment         Alcohol  / Substance Use: Not Applicable Psych Involvement: No (comment)  Admission diagnosis:  Acute pancreatitis [K85.90] Patient Active Problem List   Diagnosis Date Noted   Acute pancreatitis 01/13/2024   Pancreatic pseudocyst 10/03/2023    Chronic HFrEF (heart failure with reduced ejection fraction) (HCC) 10/03/2023   COPD (chronic obstructive pulmonary disease) (HCC) 10/03/2023   History of Clostridium difficile colitis 07/10/2022   Alcohol  use disorder 06/21/2022   Exposed orthopaedic hardware 06/21/2022   Pyogenic inflammation of bone (HCC) 06/21/2022   Open wound of right foot 06/10/2022   Acute on chronic pancreatitis (HCC) 06/16/2020   Hypokalemia 06/15/2020   Alcohol  dependence with uncomplicated withdrawal (HCC) 06/15/2020   Tobacco dependence 06/15/2020   Marijuana abuse 06/15/2020   Alcoholic pancreatitis    History of kidney stones    Hypertension    Allergies    Depression    Radiculopathy 08/29/2017   PCP:  Leonel Cole, MD Pharmacy:   [facility] [phone], [place] - [address] AT [facility] [address] [facility] [address] [place] [address] Phone: [phone] Fax: [phone]  [place] [facility] [address] [place] [address] Phone: [phone] Fax: [phone]  [facility] [address] [place] [address] Phone: [phone] Fax: [phone]     Social Drivers of Health (SDOH) Social History: SDOH Screenings   Food Insecurity: No Food Insecurity (10/04/2023)  Housing: Unknown (10/04/2023)  Transportation Needs: No Transportation Needs (10/04/2023)  Utilities: Not  At Risk (10/04/2023)  Financial Resource Strain: Low Risk (08/07/2023)   Received from [facility]  Physical Activity: Inactive (08/07/2023)   Received from [facility]  Social Connections: Patient Declined (08/07/2023)   Received from [facility]  Stress: Stress Concern Present (08/07/2023)   Received from [facility]  Tobacco Use: High Risk (01/13/2024)   SDOH Interventions:     Readmission Risk Interventions    01/14/2024    8:22 AM  Readmission Risk Prevention Plan  Transportation Screening Complete  HRI or Home  Care Consult Complete  Social Work Consult for Recovery Care Planning/Counseling Complete  Palliative Care Screening Not Applicable  Medication Review [employer]) Complete

## 2024-01-14 NOTE — ED Notes (Addendum)
 Pt has been uncomfortable all night despite PRN dilaudid . No vomiting PRN Zofran  was administered for nausea. Pain is in RLQ primarily. She is tearful and states I've never felt this severe of pain before.  VSS. She is on 2 L [place] for SPO2 of 88% on RA.  NSR with rate of 94 on monitor. Kellogg RN

## 2024-01-14 NOTE — Progress Notes (Signed)
 " PROGRESS NOTE  Suzanne Jackson, is a 53 y.o. female, DOB - [birth year], FMW:[id number]  Admit date - 01/13/2024   Admitting Physician Tully FORBES Carwin, MD  Outpatient Primary MD for the patient is Leonel Cole, MD  LOS - 1  Chief Complaint  Patient presents with   Abdominal Pain       Brief Narrative:  53 y.o. female with medical history significant for alcoholic pancreatitis, CHF, COPD hypertension admitted on 01/13/2023 with acute pancreatitis in the setting of ongoing alcohol  abuse    -Assessment and Plan: 1)Acute alcoholic pancreatitis- -patient admits to alcohol  use (Wine) around Christmas  --CTAP WC-complex cystic mass in the pancreatic head minimally increased in size, surrounding stranding and fluid, worsening infectious/inflammatory process and worsening acute pancreatitis.  Cystic neoplasm of pancreatic head not excluded.  Pancreatic duct and biliary duct dilation.  EUS, possible FNA and surgical consultation recommended. -Patient follows with [facility], Dr. Dr. Kriss, She has EUS scheduled for 01/23/2024 with Dr. Burnette.. - GI consult appreciated -Continue IV fluids, -Trial of oral fluids -As needed antiemetics and IV narcotics   2)History of alcohol  abuse-reports she and her husband have quit drinking alcohol --patient admits to alcohol  use (Wine) around Christmas  - Thiamine  folate multivitamins when able to take orally  3) Hx of ventricular thrombus - Continue full anticoagulation with Eliquis   4)Systolic CHF- stable and compensated, appears dehydrated.  Last echo 09/2025 EF of less than 20%. - Lasix  on hold due to above --Be judicious with IV fluid   ? Rash-reports 2 small white patchy areas to right forearm that started here in the ED. Dilaudid  and Zofran  given in ED.  At this time no culprit medications identified. -  Hypertension--- hold PTA BP meds due to soft BP --Continue IV fluids ( Lasix  20 mg , losartan 25 mg daily, metoprolol  50 mg daily are all on  hold)   Status is: Inpatient   Disposition: The patient is from: Home              Anticipated d/c is to: Home              Anticipated d/c date is: > 3 days              Patient currently is not medically stable to d/c. Barriers: Not Clinically Stable-   Code Status : -  Code Status: Full Code   Family Communication:   NA (patient is alert, awake and coherent)   DVT Prophylaxis  :   - SCDs  apixaban  (ELIQUIS ) tablet 5 mg   Lab Results  Component Value Date   PLT 263 01/14/2024    Inpatient Medications  Scheduled Meds:  apixaban   5 mg Oral BID   budesonide -glycopyrrolate -formoterol   2 puff Inhalation BID   hydrOXYzine   25 mg Oral TID   Continuous Infusions:  azithromycin  Stopped (01/14/24 0050)   cefTRIAXone  (ROCEPHIN )  IV Stopped (01/13/24 2329)   dextrose  5% lactated ringers  50 mL/hr at 01/14/24 1841   PRN Meds:.acetaminophen  **OR** acetaminophen , albuterol , diphenhydrAMINE , HYDROmorphone  (DILAUDID ) injection, ondansetron  **OR** ondansetron  (ZOFRAN ) IV, polyethylene glycol   Anti-infectives (From admission, onward)    Start     Dose/Rate Route Frequency Ordered Stop   01/13/24 2300  cefTRIAXone  (ROCEPHIN ) 2 g in sodium chloride  0.9 % 100 mL IVPB        2 g 200 mL/hr over 30 Minutes Intravenous Every 24 hours 01/13/24 2247     01/13/24 2300  azithromycin  (ZITHROMAX ) 500 mg in sodium chloride   0.9 % 250 mL IVPB        500 mg 250 mL/hr over 60 Minutes Intravenous Every 24 hours 01/13/24 2247           Subjective: Suzanne Jackson today has no fevers,  No chest pain,   - No further emesis, - Abdominal pain persist - Per GI team okay to give trial of oral liquid    Objective: Vitals:   01/14/24 1430 01/14/24 1623 01/14/24 1630 01/14/24 1719  BP: 130/77  114/80 127/69  Pulse: (!) 106  100 (!) 106  Resp: (!) 30  (!) 26 20  Temp:  98 F (36.7 C)  97.7 F (36.5 C)  TempSrc:  Oral  Oral  SpO2: 91%  91% 92%  Weight:    56.6 kg  Height:         Intake/Output Summary (Last 24 hours) at 01/14/2024 1846 Last data filed at 01/14/2024 0911 Gross per 24 hour  Intake 3838.94 ml  Output --  Net 3838.94 ml   Filed Weights   01/13/24 1759 01/14/24 1719  Weight: 53.5 kg 56.6 kg    Physical Exam Gen:- Awake Alert, appears uncomfortable GI HEENT:- [place].AT, No sclera icterus Neck-Supple Neck,No JVD,.  Lungs-  CTAB , fair symmetrical air movement CV- S1, S2 normal, regular  Abd-  +ve B.Sounds, Abd Soft, has abdominal discomfort without rebound or guarding,    Extremity/Skin:- No  edema, pedal pulses present  Psych-affect is appropriate, oriented x3 Neuro-no new focal deficits, no tremors  Data Reviewed: I have personally reviewed following labs and imaging studies  CBC: Recent Labs  Lab 01/13/24 1816 01/14/24 0702  WBC 16.3* 19.3*  HGB 14.4 13.1  HCT 44.5 40.9  MCV 93.5 95.8  PLT 321 263   Basic Metabolic Panel: Recent Labs  Lab 01/13/24 1816 01/14/24 0702  NA 134* 136  K 4.1 4.3  CL 98 100  CO2 20* 26  GLUCOSE 109* 112*  BUN 21* 13  CREATININE 1.05* 0.68  CALCIUM  9.5 9.3   GFR: Estimated Creatinine Clearance: 60.7 mL/min (by C-G formula based on SCr of 0.68 mg/dL). Liver Function Tests: Recent Labs  Lab 01/13/24 1816 01/14/24 0702  AST 27 52*  ALT 13 38  ALKPHOS 125 129*  BILITOT 0.9 0.7  PROT 7.5 6.7  ALBUMIN 4.3 3.7   Recent Results (from the past 240 hours)  Resp panel by RT-PCR (RSV, Flu A&B, Covid) Anterior Nasal Swab     Status: None   Collection Time: 01/13/24  6:06 PM   Specimen: Anterior Nasal Swab  Result Value Ref Range Status   SARS Coronavirus 2 by RT PCR NEGATIVE NEGATIVE Final    Comment: (NOTE) SARS-CoV-2 target nucleic acids are NOT DETECTED.  The SARS-CoV-2 RNA is generally detectable in upper respiratory specimens during the acute phase of infection. The lowest concentration of SARS-CoV-2 viral copies this assay can detect is 138 copies/mL. A negative result does not  preclude SARS-Cov-2 infection and should not be used as the sole basis for treatment or other patient management decisions. A negative result may occur with  improper specimen collection/handling, submission of specimen other than nasopharyngeal swab, presence of viral mutation(s) within the areas targeted by this assay, and inadequate number of viral copies(<138 copies/mL). A negative result must be combined with clinical observations, patient history, and epidemiological information. The expected result is Negative.  Fact Sheet for Patients:  bloggercourse.com  Fact Sheet for Healthcare Providers:  seriousbroker.it  This test is no t yet  approved or cleared by the [place]  FDA and  has been authorized for detection and/or diagnosis of SARS-CoV-2 by FDA under an Emergency Use Authorization (EUA). This EUA will remain  in effect (meaning this test can be used) for the duration of the COVID-19 declaration under Section 564(b)(1) of the Act, 21 U.S.C.section 360bbb-3(b)(1), unless the authorization is terminated  or revoked sooner.       Influenza A by PCR NEGATIVE NEGATIVE Final   Influenza B by PCR NEGATIVE NEGATIVE Final    Comment: (NOTE) The Xpert Xpress SARS-CoV-2/FLU/RSV plus assay is intended as an aid in the diagnosis of influenza from Nasopharyngeal swab specimens and should not be used as a sole basis for treatment. Nasal washings and aspirates are unacceptable for Xpert Xpress SARS-CoV-2/FLU/RSV testing.  Fact Sheet for Patients: bloggercourse.com  Fact Sheet for Healthcare Providers: seriousbroker.it  This test is not yet approved or cleared by the [place]  FDA and has been authorized for detection and/or diagnosis of SARS-CoV-2 by FDA under an Emergency Use Authorization (EUA). This EUA will remain in effect (meaning this test can be used) for the  duration of the COVID-19 declaration under Section 564(b)(1) of the Act, 21 U.S.C. section 360bbb-3(b)(1), unless the authorization is terminated or revoked.     Resp Syncytial Virus by PCR NEGATIVE NEGATIVE Final    Comment: (NOTE) Fact Sheet for Patients: bloggercourse.com  Fact Sheet for Healthcare Providers: seriousbroker.it  This test is not yet approved or cleared by the [place]  FDA and has been authorized for detection and/or diagnosis of SARS-CoV-2 by FDA under an Emergency Use Authorization (EUA). This EUA will remain in effect (meaning this test can be used) for the duration of the COVID-19 declaration under Section 564(b)(1) of the Act, 21 U.S.C. section 360bbb-3(b)(1), unless the authorization is terminated or revoked.  Performed at [facility], [address], [place], [place] [address]     Radiology Studies: US  Abdomen Limited Result Date: 01/14/2024 EXAM: Right Upper Quadrant Abdominal Ultrasound 01/14/2024 12:14:45 PM TECHNIQUE: Real-time ultrasonography of the right upper quadrant of the abdomen was performed. COMPARISON: CT dated 01/13/2024. CLINICAL HISTORY: Elevated LFTs. FINDINGS: LIVER: Normal echogenicity. No evidence of mass. Hepatopetal flow in the portal vein. BILIARY SYSTEM: The gallbladder is surgically absent. Biliary ductal dilation with the common bile duct measuring up to 9.9 mm in diameter. OTHER: No right upper quadrant ascites. IMPRESSION: 1. Biliary ductal dilation with the common bile duct measuring up to 9.9 mm in diameter. 2. Known pancreatic head lesion is better seen on same-day CT. consider ERCP/EUS for further evaluation. Electronically signed by: Michaeline Blanch MD 01/14/2024 01:59 PM EST RP Workstation: [id number]   CT ABDOMEN PELVIS W CONTRAST Result Date: 01/13/2024 EXAM: CT ABDOMEN AND PELVIS WITH CONTRAST 01/13/2024 07:47:19 PM TECHNIQUE: CT of the abdomen and pelvis was performed with  the administration of 80 mL of iohexol  (OMNIPAQUE ) 300 MG/ML solution. Multiplanar reformatted images are provided for review. Automated exposure control, iterative reconstruction, and/or weight-based adjustment of the mA/kV was utilized to reduce the radiation dose to as low as reasonably achievable. COMPARISON: CT abdomen and pelvis 10/02/2023. CLINICAL HISTORY: RLQ abdominal pain. FINDINGS: LOWER CHEST: There are new patchy ground glass opacities in both lung bases. LIVER: The liver is mildly enlarged. GALLBLADDER AND BILE DUCTS: There is intrahepatic and extrahepatic biliary ductal dilatation which has increased from prior study. SPLEEN: No acute abnormality. PANCREAS: Complex multiloculated cystic mass is seen in the region of the pancreatic head measuring 3.5 x 5.9 cm,  minimally increased in size. There is surrounding inflammatory stranding and fluid which has increased. There is new mild pancreatic ductal dilatation in the body of tail of the pancreas. ADRENAL GLANDS: No acute abnormality. KIDNEYS, URETERS AND BLADDER: There is a subcentimeter hypodensity in the left kidney which is too small to characterize, likely a cyst. There is scarring in the superior pole of the left kidney. No stones in the kidneys or ureters. No hydronephrosis. No perinephric or periureteral stranding. Urinary bladder is unremarkable. GI AND BOWEL: There is wall thickening and inflammation involving the proximal transverse colon abutting inflammatory fluid related to the pancreas. There is some new wall thickening of the duodenum. The appendix is not visualized. Stomach demonstrates no acute abnormality. There is no bowel obstruction. PERITONEUM AND RETROPERITONEUM: There is a new small amount of free fluid in the pelvis. No free air. VASCULATURE: Aorta is normal in caliber. There are atherosclerotic calcifications of the aorta and iliac arteries. LYMPH NODES: No lymphadenopathy. REPRODUCTIVE ORGANS: No acute abnormality. BONES AND  SOFT TISSUES: Lower lumbar fusion hardware is unchanged. There is a small fat containing umbilical hernia. No acute osseous abnormality. No focal soft tissue abnormality. IMPRESSION: 1. Complex multiloculated cystic mass in the region of the pancreatic head, minimally increased in size, with increased surrounding inflammatory stranding and fluid. Findings may be related to worsening infectious/inflammatory process and worsening acute pancreatitis. Cystic neoplasm of the pancreatic head not excluded. There is increasing pancreatic ductal and biliary ductal dilatation. Recommend EUS with possible FNA and surgical consultation. 2. Wall thickening and inflammation involving the proximal transverse colon abutting inflammatory fluid related to the pancreas, with new wall thickening of the duodenum. 3. New patchy ground glass opacities in both lung bases. Electronically signed by: Greig Pique MD 01/13/2024 08:28 PM EST RP Workstation: [id number]   Scheduled Meds:  apixaban   5 mg Oral BID   budesonide -glycopyrrolate -formoterol   2 puff Inhalation BID   hydrOXYzine   25 mg Oral TID   Continuous Infusions:  azithromycin  Stopped (01/14/24 0050)   cefTRIAXone  (ROCEPHIN )  IV Stopped (01/13/24 2329)   dextrose  5% lactated ringers  50 mL/hr at 01/14/24 1841     LOS: 1 day   Rendall Carwin M.D on 01/14/2024 at 6:46 PM  Go to [email or web address] - for contact info  [facility] - Office  [phone]  If 7PM-7AM, please contact night-coverage [email or web address] 01/14/2024, 6:46 PM    "

## 2024-01-14 NOTE — Telephone Encounter (Signed)
 Patient Product/process Development Scientist completed.    The patient is insured through [facility]. Patient has [employer], may use a copay card, and/or apply for patient assistance if available.    Ran test claim for Eliquis  5 mg and the current 30 day co-pay is $0.00.   This test claim was processed through [facility]- copay amounts may vary at other pharmacies due to pharmacy/plan contracts, or as the patient moves through the different stages of their insurance plan.     Reyes Sharps, CPHT Pharmacy Technician Patient Advocate Specialist Lead [facility] Patient Advocate Team Direct Number: [phone]  Fax: [phone]

## 2024-01-14 NOTE — ED Notes (Signed)
 Assisted pt to restroom

## 2024-01-14 NOTE — Consult Note (Cosign Needed)
 "  Gastroenterology Consult   Referring Provider: Dr Pearlean Primary Care Physician:  Leonel Cole, MD Primary Gastroenterologist:  [facility]  Patient ID: Suzanne Jackson; [id number]; 21-May-1970   Admit date: 01/13/2024  LOS: 1 day   Date of Consultation: 01/14/2024  Reason for Consultation:  pancreatitis.   History of Present Illness   Suzanne Jackson is a 53 y.o. year old female with past medical history of HTN, COPD, chronic alcohol  use, dilated cardiomyopathy felt secondary to ETOH abuse, left ventricle thrombus on Eliquis , acute on chronic pancreatitis with pancreatic pseudocysts and with several prior admissions, complex cystic mass of pancreatic head with upcoming EUS previously planned as outpatient next week, now presenting with acute recurrent abdominal pain and admitted with acute pancreatitis. GI consulted for further management.  She is followed by [facility]: I am unable to see all of the notes. Recently hospitalized for acute on chronic pancreatitis in Sept 2025, discharged 9/18. Noted acute onset of pain on Friday, located initially in upper abdomen but now also RLQ and wrapping around her back. Associated nausea and vomiting prior to admission.   In the ED: Lipase greater than 2800 LFTs normal Repeat CMP today with AST 52, ALT 38, Alk Phos 129, Tbili 0.7,  Creatinine 1.05>0.68 WBC count 16.3>19.3  CT abd/pelvis with contrast noting complex multicolulated cystic mass in region of pancreatic head, minimally increased in size, surround inflammation. Increasing pancreatic ductal dilatation and biliary ductal dilatation.  Wall thickening and inflammation involving proximal transverse colon abuts fluid r/t pancreas, new wall thickening of duodenum New patchy ground glass opacities in both lung bases.   Today: She is in pain RLQ. Sitting on edge of bed in ED. Received dilaudid  earlier this morning. Pain is constant, nothing improves. Afebrile currently.  Tachycardic in low 100s. Increased respiratory rate but does not appear to have any respiratory distress. Anxious.   She notes wine on Christmas Eve, half a glass. None prior since last hospitalization.   Low grade fever prior to hospital (100.2).   Gallbladder absent.   Smokes one pack per day.       Past Medical History:  Diagnosis Date   Alcohol  dependence with uncomplicated withdrawal (HCC) 06/15/2020   Alcoholic pancreatitis    Allergies    Arthritis    BACK   Asthma    Depression    Frequency of urination    History of Clostridium difficile colitis 07/10/2022   History of kidney stones    Hypertension    Leg pain    Left   Marijuana abuse 06/15/2020   Microhematuria    Right ureteral stone    Tobacco dependence 06/15/2020   Wears contact lenses     Past Surgical History:  Procedure Laterality Date   ANTERIOR LAT LUMBAR FUSION N/A 08/29/2017   Procedure: LUMBAR FOUR-FIVE LATERAL INTERBODY FUSION WITH INSTRUMENTATION AND ALLOGRAFT;  Surgeon: Beuford Anes, MD;  Location: [facility];  Service: Orthopedics;  Laterality: N/A;   APPLICATION OF WOUND VAC Right 06/09/2022   Procedure: APPLICATION OF WOUND VAC;  Surgeon: Silva Juliene SAUNDERS, DPM;  Location: [facility];  Service: Podiatry;  Laterality: Right;   CYSTO/  URETEROSCOPIC STONE EXTRACTION  1998   CYSTOSCOPY WITH RETROGRADE PYELOGRAM, URETEROSCOPY AND STENT PLACEMENT Right 02/12/2015   Procedure: CYSTOSCOPY WITH RETROGRADE PYELOGRAM, URETEROSCOPY;  Surgeon: Arlena Gal, MD;  Location: [facility] ;  Service: Urology;  Laterality: Right;   DILITATION & CURRETTAGE/HYSTROSCOPY WITH NOVASURE ABLATION N/A 12/05/2017   Procedure: DILATATION & CURETTAGE/HYSTEROSCOPY  WITH NOVASURE ABLATION;  Surgeon: Ozan, Jennifer, DO;  Location: [facility];  Service: Gynecology;  Laterality: N/A;   EXTRACORPOREAL SHOCK WAVE LITHOTRIPSY Right 08/08/2018   Procedure: EXTRACORPOREAL SHOCK WAVE LITHOTRIPSY (ESWL);   Surgeon: Renda Glance, MD;  Location: [facility];  Service: Urology;  Laterality: Right;   GRAFT APPLICATION Right 06/09/2022   Procedure: GRAFT APPLICATION;  Surgeon: Silva Juliene SAUNDERS, DPM;  Location: [facility];  Service: Podiatry;  Laterality: Right;   LAPAROSCOPIC CHOLECYSTECTOMY  1994   LAPAROSCOPIC TUBAL LIGATION Bilateral 05-06-2009   fulgeration   POSTERIOR FUSION LUMBAR SPINE  10-28-2008   L5 - S1   STONE EXTRACTION WITH BASKET Right 02/12/2015   Procedure: STONE EXTRACTION WITH BASKET AND LASER LITHOTRIPSY;  Surgeon: Arlena Gal, MD;  Location: [facility];  Service: Urology;  Laterality: Right;   TUBAL LIGATION     WOUND DEBRIDEMENT Right 06/09/2022   Procedure: DEBRIDEMENT WOUND;  Surgeon: Silva Juliene SAUNDERS, DPM;  Location: [facility];  Service: Podiatry;  Laterality: Right;    Prior to Admission medications  Medication Sig Start Date End Date Taking? Authorizing Provider  albuterol  (PROVENTIL ) (2.5 MG/3ML) 0.083% nebulizer solution Take 3 mLs by nebulization every 6 (six) hours as needed for wheezing or shortness of breath. 05/14/23  Yes [provider]  albuterol  (VENTOLIN  HFA) 108 (90 Base) MCG/ACT inhaler Inhale 2 puffs into the lungs 2 (two) times daily. 03/15/23  Yes [provider]  DULoxetine  (CYMBALTA ) 60 MG capsule Take 60 mg by mouth daily. 09/18/19  Yes [provider]  ELIQUIS  5 MG TABS tablet Take 5 mg by mouth 2 (two) times daily.   Yes [provider]  FARXIGA 10 MG TABS tablet Take 10 mg by mouth daily.   Yes [provider]  fexofenadine (ALLEGRA) 180 MG tablet Take 180 mg by mouth daily.   Yes [provider]  folic acid  (FOLVITE ) 1 MG tablet Take 1 tablet (1 mg total) by mouth daily. 09/24/23 09/23/24 Yes Jillian Buttery, MD  furosemide  (LASIX ) 20 MG tablet Take 1 tablet (20 mg total) by mouth daily. Patient taking differently: Take 20 mg by mouth every other day. 07/24/23  Yes Barrett, Jamie N, PA-C   HYDROcodone -acetaminophen  (NORCO) 7.5-325 MG tablet Take 1 tablet by mouth every four to six hours as needed for pain 10/04/23  Yes Pahwani, Fredia, MD  hydrOXYzine  (ATARAX /VISTARIL ) 50 MG tablet Take 1 tablet (50 mg total) by mouth every 6 (six) hours as needed (refractory itching). 08/31/17  Yes McKenzie, Kayla J, PA-C  metoprolol  succinate (TOPROL -XL) 50 MG 24 hr tablet Take 50 mg by mouth daily.   Yes [provider]  montelukast  (SINGULAIR ) 10 MG tablet Take 10 mg by mouth at bedtime. 11/21/12  Yes [provider]  sacubitril-valsartan (ENTRESTO) 24-26 MG Take 1 tablet by mouth 2 (two) times daily.   Yes [provider]  spironolactone (ALDACTONE) 25 MG tablet Take 25 mg by mouth daily.   Yes [provider]  thiamine  (VITAMIN B1) 100 MG tablet Take 1 tablet (100 mg total) by mouth daily. 09/24/23  Yes Adhikari, Amrit, MD  traZODone (DESYREL) 50 MG tablet Take 50 mg by mouth at bedtime as needed for sleep. 10/16/23  Yes [provider]  TRELEGY ELLIPTA 100-62.5-25 MCG/ACT AEPB Take 1 puff by mouth daily.   Yes [provider]  TYLENOL  8 HOUR 650 MG CR tablet Take 650-1,300 mg by mouth every 8 (eight) hours as needed for pain.   Yes  [provider]  Vitamin D, Ergocalciferol, (DRISDOL) 1.25 MG (50000 UNIT) CAPS capsule Take 50,000 Units by mouth once a week. 09/19/23  Yes [provider]    Current Facility-Administered Medications  Medication Dose Route Frequency Provider Last Rate Last Admin   acetaminophen  (TYLENOL ) tablet 650 mg  650 mg Oral Q6H PRN Emokpae, Ejiroghene E, MD       Or   acetaminophen  (TYLENOL ) suppository 650 mg  650 mg Rectal Q6H PRN Emokpae, Ejiroghene E, MD       albuterol  (PROVENTIL ) (2.5 MG/3ML) 0.083% nebulizer solution 3 mL  3 mL Nebulization Q6H PRN Emokpae, Ejiroghene E, MD       apixaban  (ELIQUIS ) tablet 5 mg  5 mg Oral BID Emokpae, Ejiroghene E, MD   5 mg at 01/14/24 0916   azithromycin  (ZITHROMAX )  500 mg in sodium chloride  0.9 % 250 mL IVPB  500 mg Intravenous Q24H Emokpae, Ejiroghene E, MD   Stopped at 01/14/24 0050   budesonide -glycopyrrolate -formoterol  (BREZTRI ) 160-9-4.8 MCG/ACT inhaler 2 puff  2 puff Inhalation BID Emokpae, Ejiroghene E, MD   2 puff at 01/14/24 0653   cefTRIAXone  (ROCEPHIN ) 2 g in sodium chloride  0.9 % 100 mL IVPB  2 g Intravenous Q24H Emokpae, Ejiroghene E, MD   Stopped at 01/13/24 2329   dextrose  5 % in lactated ringers  infusion   Intravenous Continuous Emokpae, Ejiroghene E, MD 50 mL/hr at 01/14/24 0912 New Bag at 01/14/24 0912   diphenhydrAMINE  (BENADRYL ) injection 25 mg  25 mg Intravenous Q8H PRN Pearlean Manus, MD   25 mg at 01/14/24 9162   HYDROmorphone  (DILAUDID ) injection 0.5 mg  0.5 mg Intravenous Q4H PRN Emokpae, Ejiroghene E, MD   0.5 mg at 01/14/24 9162   ondansetron  (ZOFRAN ) tablet 4 mg  4 mg Oral Q6H PRN Emokpae, Ejiroghene E, MD       Or   ondansetron  (ZOFRAN ) injection 4 mg  4 mg Intravenous Q6H PRN Emokpae, Ejiroghene E, MD   4 mg at 01/13/24 2356   polyethylene glycol (MIRALAX  / GLYCOLAX ) packet 17 g  17 g Oral Daily PRN Emokpae, Ejiroghene E, MD       Current Outpatient Medications  Medication Sig Dispense Refill   albuterol  (PROVENTIL ) (2.5 MG/3ML) 0.083% nebulizer solution Take 3 mLs by nebulization every 6 (six) hours as needed for wheezing or shortness of breath.     albuterol  (VENTOLIN  HFA) 108 (90 Base) MCG/ACT inhaler Inhale 2 puffs into the lungs 2 (two) times daily.     DULoxetine  (CYMBALTA ) 60 MG capsule Take 60 mg by mouth daily.     ELIQUIS  5 MG TABS tablet Take 5 mg by mouth 2 (two) times daily.     FARXIGA 10 MG TABS tablet Take 10 mg by mouth daily.     fexofenadine (ALLEGRA) 180 MG tablet Take 180 mg by mouth daily.     folic acid  (FOLVITE ) 1 MG tablet Take 1 tablet (1 mg total) by mouth daily. 60 tablet 0   furosemide  (LASIX ) 20 MG tablet Take 1 tablet (20 mg total) by mouth daily. (Patient taking differently: Take 20 mg by mouth  every other day.) 15 tablet 0   HYDROcodone -acetaminophen  (NORCO) 7.5-325 MG tablet Take 1 tablet by mouth every four to six hours as needed for pain 15 tablet 0   hydrOXYzine  (ATARAX /VISTARIL ) 50 MG tablet Take 1 tablet (50 mg total) by mouth every 6 (six) hours as needed (refractory itching). 30 tablet 0   metoprolol  succinate (TOPROL -XL) 50 MG 24 hr  tablet Take 50 mg by mouth daily.     montelukast  (SINGULAIR ) 10 MG tablet Take 10 mg by mouth at bedtime.     sacubitril-valsartan (ENTRESTO) 24-26 MG Take 1 tablet by mouth 2 (two) times daily.     spironolactone (ALDACTONE) 25 MG tablet Take 25 mg by mouth daily.     thiamine  (VITAMIN B1) 100 MG tablet Take 1 tablet (100 mg total) by mouth daily. 60 tablet 0   traZODone (DESYREL) 50 MG tablet Take 50 mg by mouth at bedtime as needed for sleep.     TRELEGY ELLIPTA 100-62.5-25 MCG/ACT AEPB Take 1 puff by mouth daily.     TYLENOL  8 HOUR 650 MG CR tablet Take 650-1,300 mg by mouth every 8 (eight) hours as needed for pain.     Vitamin D, Ergocalciferol, (DRISDOL) 1.25 MG (50000 UNIT) CAPS capsule Take 50,000 Units by mouth once a week.      Allergies as of 01/13/2024 - Review Complete 01/13/2024  Allergen Reaction Noted   Aleve [naproxen] Anaphylaxis 03/24/2020   Aspirin Anaphylaxis and Shortness Of Breath 12/03/2012   Ibuprofen Anaphylaxis 12/03/2012   Oxycodone -acetaminophen  Itching and Other (See Comments) 08/30/2017   Bupropion Other (See Comments) 03/24/2020    Family History  Problem Relation Age of Onset   Pancreatitis Neg Hx     Social History   Socioeconomic History   Marital status: Married    Spouse name: Not on file   Number of children: Not on file   Years of education: Not on file   Highest education level: Not on file  Occupational History   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  Tobacco Use   Smoking status: Every Day    Current packs/day: 1.50    Average packs/day: 1.5 packs/day for 39.0 years (58.5 ttl pk-yrs)    Types:  Cigarettes, E-cigarettes    Start date: 1987   Smokeless tobacco: Never   Tobacco comments:    Vape daily - DJM 08/17/2022  Vaping Use   Vaping status: Never Used  Substance and Sexual Activity   Alcohol  use: Yes    Alcohol /week: 4.0 - 5.0 standard drinks of alcohol     Types: 4 - 5 Shots of liquor per week    Comment: binge drinking; recently drinking 5-7 days a week, heavily; + tremors with withdrawal   Drug use: Not Currently    Types: Marijuana    Comment: regular use, last dose more than 1 month from 05/2022   Sexual activity: Not on file  Other Topics Concern   Not on file  Social History Narrative   Not on file   Social Drivers of Health   Tobacco Use: High Risk (01/13/2024)   Patient History    Smoking Tobacco Use: Every Day    Smokeless Tobacco Use: Never    Passive Exposure: Not on file  Financial Resource Strain: Low Risk (08/07/2023)   Received from [facility]   Overall Financial Resource Strain (CARDIA)    How hard is it for you to pay for the very basics like food, housing, medical care, and heating?: Not hard at all  Food Insecurity: No Food Insecurity (10/04/2023)   Epic    Worried About Programme Researcher, Broadcasting/film/video in the Last Year: Never true    Ran Out of Food in the Last Year: Never true  Transportation Needs: No Transportation Needs (10/04/2023)   Epic    Lack of Transportation (Medical): No    Lack of Transportation (Non-Medical): No  Physical Activity:  Inactive (08/07/2023)   Received from [facility]   Exercise Vital Sign    On average, how many days per week do you engage in moderate to strenuous exercise (like a brisk walk)?: 0 days    Minutes of Exercise per Session: Not on file  Stress: Stress Concern Present (08/07/2023)   Received from [facility] - Occupational Stress Questionnaire    Do you feel stress - tense, restless, nervous, or anxious, or unable to sleep at night because your mind is troubled all  the time - these days?: Rather much  Social Connections: Patient Declined (08/07/2023)   Received from [facility]   Social Network    How would you rate your social network (family, work, friends)?: Patient declined  Intimate Partner Violence: Unknown (10/04/2023)   Epic    Fear of Current or Ex-Partner: No    Emotionally Abused: No    Physically Abused: No    Sexually Abused: Not on file  Depression (PHQ2-9): Not on file  Alcohol  Screen: Not on file  Housing: Unknown (10/04/2023)   Epic    Unable to Pay for Housing in the Last Year: No    Number of Times Moved in the Last Year: Not on file    Homeless in the Last Year: No  Utilities: Not At Risk (10/04/2023)   Epic    Threatened with loss of utilities: No  Health Literacy: Not on file     Review of Systems   See HPI  Physical Exam   Vital Signs in last 24 hours: Temp:  [97.5 F (36.4 C)-98 F (36.7 C)] 98 F (36.7 C) (12/29 0741) Pulse Rate:  [69-105] 105 (12/29 0915) Resp:  [14-27] 21 (12/29 0915) BP: (90-119)/(50-89) 117/70 (12/29 0915) SpO2:  [91 %-100 %] 91 % (12/29 0915) FiO2 (%):  [32 %] 32 % (12/28 2244) Weight:  [53.5 kg] 53.5 kg (12/28 1759) Last BM Date : 01/12/24  General:   Alert,  acutely ill-appearing, anxious, sitting on side of stretcher, uncomfortable Head:  Normocephalic and atraumatic. Eyes:  Sclera clear, no icterus.    Ears:  Normal auditory acuity. Lungs:  scattered rhonchi Heart:  S1 S2 present, tachycardic in low 100s Abdomen:  Soft, TTP RLQ, RUQ/epigastric, patient unable to fully lay down as feels uncomfortable Extremities:  Without edema. Neurologic:  Alert and  oriented x4.  Intake/Output from previous day: 12/28 0701 - 12/29 0700 In: 2839.9 [IV Piggyback:2839.9] Out: -  Intake/Output this shift: Total I/O In: 999 [IV Piggyback:999] Out: -     Labs/Studies   Recent Labs Recent Labs    01/13/24 1816 01/14/24 0702  WBC 16.3* 19.3*  HGB 14.4 13.1  HCT 44.5 40.9  PLT  321 263   BMET Recent Labs    01/13/24 1816 01/14/24 0702  NA 134* 136  K 4.1 4.3  CL 98 100  CO2 20* 26  GLUCOSE 109* 112*  BUN 21* 13  CREATININE 1.05* 0.68  CALCIUM  9.5 9.3   LFT Recent Labs    01/13/24 1816 01/14/24 0702  PROT 7.5 6.7  ALBUMIN 4.3 3.7  AST 27 52*  ALT 13 38  ALKPHOS 125 129*  BILITOT 0.9 0.7     Radiology/Studies CT ABDOMEN PELVIS W CONTRAST Result Date: 01/13/2024 EXAM: CT ABDOMEN AND PELVIS WITH CONTRAST 01/13/2024 07:47:19 PM TECHNIQUE: CT of the abdomen and pelvis was performed with the administration of 80 mL of iohexol  (OMNIPAQUE ) 300 MG/ML solution. Multiplanar reformatted  images are provided for review. Automated exposure control, iterative reconstruction, and/or weight-based adjustment of the mA/kV was utilized to reduce the radiation dose to as low as reasonably achievable. COMPARISON: CT abdomen and pelvis 10/02/2023. CLINICAL HISTORY: RLQ abdominal pain. FINDINGS: LOWER CHEST: There are new patchy ground glass opacities in both lung bases. LIVER: The liver is mildly enlarged. GALLBLADDER AND BILE DUCTS: There is intrahepatic and extrahepatic biliary ductal dilatation which has increased from prior study. SPLEEN: No acute abnormality. PANCREAS: Complex multiloculated cystic mass is seen in the region of the pancreatic head measuring 3.5 x 5.9 cm, minimally increased in size. There is surrounding inflammatory stranding and fluid which has increased. There is new mild pancreatic ductal dilatation in the body of tail of the pancreas. ADRENAL GLANDS: No acute abnormality. KIDNEYS, URETERS AND BLADDER: There is a subcentimeter hypodensity in the left kidney which is too small to characterize, likely a cyst. There is scarring in the superior pole of the left kidney. No stones in the kidneys or ureters. No hydronephrosis. No perinephric or periureteral stranding. Urinary bladder is unremarkable. GI AND BOWEL: There is wall thickening and inflammation  involving the proximal transverse colon abutting inflammatory fluid related to the pancreas. There is some new wall thickening of the duodenum. The appendix is not visualized. Stomach demonstrates no acute abnormality. There is no bowel obstruction. PERITONEUM AND RETROPERITONEUM: There is a new small amount of free fluid in the pelvis. No free air. VASCULATURE: Aorta is normal in caliber. There are atherosclerotic calcifications of the aorta and iliac arteries. LYMPH NODES: No lymphadenopathy. REPRODUCTIVE ORGANS: No acute abnormality. BONES AND SOFT TISSUES: Lower lumbar fusion hardware is unchanged. There is a small fat containing umbilical hernia. No acute osseous abnormality. No focal soft tissue abnormality. IMPRESSION: 1. Complex multiloculated cystic mass in the region of the pancreatic head, minimally increased in size, with increased surrounding inflammatory stranding and fluid. Findings may be related to worsening infectious/inflammatory process and worsening acute pancreatitis. Cystic neoplasm of the pancreatic head not excluded. There is increasing pancreatic ductal and biliary ductal dilatation. Recommend EUS with possible FNA and surgical consultation. 2. Wall thickening and inflammation involving the proximal transverse colon abutting inflammatory fluid related to the pancreas, with new wall thickening of the duodenum. 3. New patchy ground glass opacities in both lung bases. Electronically signed by: Greig Pique MD 01/13/2024 08:28 PM EST RP Workstation: [id number]     Assessment   Suzanne Jackson is a 53 y.o. year old female  with past medical history of HTN, COPD, chronic alcohol  use, dilated cardiomyopathy felt secondary to ETOH abuse and EF less than 20% on ECHO Sept 2024, , left ventricle thrombus on Eliquis , acute on chronic pancreatitis with pancreatic pseudocysts and with several prior admissions, complex cystic mass of pancreatic head with upcoming EUS previously planned as  outpatient next week, now presenting with acute recurrent abdominal pain and admitted with acute pancreatitis. GI consulted for further management.  Acute on likely chronic pancreatitis complicated by complex multiloculated cystic mass in pancreatic head with slight increase from prior CT in Sept 2025, with symptom onset following wine intake Christmas Eve prior to admission. Notably, she had avoided alcohol  intake after September hospitalization until this time. Pancreatitis previously felt related to ETOH abuse.   Persistent abdominal pain reported, findings of possible colitis on CT scan involving proximal transverse colon abutting the inflammatory fluid r/t pancreas, new wall thickening of duodenum. With multi-morbidities, concern for decompensation and may need transfer if declines. Empiric abx  already started for empiric pneumonia.   New duodenal wall thickening likely r/t acute pancreatitis. Will start PPI empirically as well.   Concern for evolving fluid overload in setting of CHF, severely low EF less than 20%. Recommend CXR  Will need outpatient follow-up with [facility] and postpone EUS upcoming due to acute episode.      Plan / Recommendations    One dose of Dilaudid  0.5 mg IV X 1 now RUQ US  due to dilated CBD. Will need EUS as planned outpatient Gentle IV fluids Recommend CXR Continue empiric abx, which can cover possible colitis as well. May be bystander effect vs true colitis PPI empirically Absolute alcohol  cessation Follow [facility] If further decline, recommend transfer to [facility]     01/14/2024, 9:52 AM  Suzanne MICAEL Stager, PhD, ANP-BC [facility]      "

## 2024-01-15 DIAGNOSIS — K8681 Exocrine pancreatic insufficiency: Secondary | ICD-10-CM | POA: Diagnosis not present

## 2024-01-15 DIAGNOSIS — I10 Essential (primary) hypertension: Secondary | ICD-10-CM | POA: Diagnosis not present

## 2024-01-15 DIAGNOSIS — K852 Alcohol induced acute pancreatitis without necrosis or infection: Secondary | ICD-10-CM | POA: Diagnosis not present

## 2024-01-15 DIAGNOSIS — K869 Disease of pancreas, unspecified: Secondary | ICD-10-CM

## 2024-01-15 DIAGNOSIS — K859 Acute pancreatitis without necrosis or infection, unspecified: Secondary | ICD-10-CM | POA: Diagnosis not present

## 2024-01-15 DIAGNOSIS — J449 Chronic obstructive pulmonary disease, unspecified: Secondary | ICD-10-CM | POA: Diagnosis not present

## 2024-01-15 DIAGNOSIS — I5022 Chronic systolic (congestive) heart failure: Secondary | ICD-10-CM | POA: Diagnosis not present

## 2024-01-15 DIAGNOSIS — K861 Other chronic pancreatitis: Secondary | ICD-10-CM | POA: Diagnosis not present

## 2024-01-15 LAB — HEPATIC FUNCTION PANEL
ALT: 17 U/L (ref 0–44)
AST: 21 U/L (ref 15–41)
Albumin: 3.3 g/dL — ABNORMAL LOW (ref 3.5–5.0)
Alkaline Phosphatase: 107 U/L (ref 38–126)
Bilirubin, Direct: 0.2 mg/dL (ref 0.0–0.2)
Indirect Bilirubin: 0.3 mg/dL (ref 0.3–0.9)
Total Bilirubin: 0.6 mg/dL (ref 0.0–1.2)
Total Protein: 6.2 g/dL — ABNORMAL LOW (ref 6.5–8.1)

## 2024-01-15 LAB — GLUCOSE, CAPILLARY
Glucose-Capillary: 103 mg/dL — ABNORMAL HIGH (ref 70–99)
Glucose-Capillary: 75 mg/dL (ref 70–99)
Glucose-Capillary: 99 mg/dL (ref 70–99)

## 2024-01-15 MED ORDER — THIAMINE MONONITRATE 100 MG PO TABS
100.0000 mg | ORAL_TABLET | Freq: Every day | ORAL | Status: DC
  Administered 2024-01-15 – 2024-01-17 (×3): 100 mg via ORAL
  Filled 2024-01-15 (×3): qty 1

## 2024-01-15 MED ORDER — LORAZEPAM 2 MG/ML IJ SOLN: 1.0000 mg | INTRAMUSCULAR | Status: DC | PRN

## 2024-01-15 MED ORDER — ADULT MULTIVITAMIN W/MINERALS CH
1.0000 | ORAL_TABLET | Freq: Every day | ORAL | Status: DC
  Administered 2024-01-15 – 2024-01-17 (×3): 1 via ORAL
  Filled 2024-01-15 (×3): qty 1

## 2024-01-15 MED ORDER — FOLIC ACID 1 MG PO TABS
1.0000 mg | ORAL_TABLET | Freq: Every day | ORAL | Status: DC
  Administered 2024-01-15 – 2024-01-17 (×3): 1 mg via ORAL
  Filled 2024-01-15 (×3): qty 1

## 2024-01-15 MED ORDER — LACTATED RINGERS IV SOLN: INTRAVENOUS | Status: AC

## 2024-01-15 MED ORDER — THIAMINE HCL 100 MG/ML IJ SOLN: 100.0000 mg | Freq: Every day | INTRAMUSCULAR | Status: DC

## 2024-01-15 MED ORDER — LORAZEPAM 1 MG PO TABS: 1.0000 mg | ORAL_TABLET | ORAL | Status: DC | PRN

## 2024-01-15 NOTE — Progress Notes (Signed)
 Sent staff message to Dr. Burnette within epic to update him on the patient's current admission and to get his input on the plan for EUS if he would like to postpone this procedure or not and give us  some direction on what the plan would be going forward.  Will update once response is received.  Charmaine Melia, MSN, APRN, FNP-BC, AGACNP-BC [facility] at [facility]

## 2024-01-15 NOTE — Progress Notes (Signed)
 "  Gastroenterology Progress Note   Referring Provider: No ref. provider found Primary Care Physician:  Leonel Cole, MD Primary Gastroenterologist:  [facility] - Dr. Burnette ?  Patient ID: Suzanne Jackson; [id number]; 1970/11/04    Subjective   Patient reports feeling tired.  Continuing to have some shortness of breath that she reports is about the same as yesterday.  She continues to have some abdominal pain, primarily on the right side.  Today reports more in the right upper quadrant but still having some mild right lower quadrant pain.  Does report some anxiety about her condition.  Reports pain 5/10 and has not eaten much at all today, lack of appetite.  She is trying to stay somewhat hydrated with sips of water.  Denies any constipation or diarrhea.  Objective   Vital signs in last 24 hours Temp:  [97.7 F (36.5 C)-98.6 F (37 C)] 98.4 F (36.9 C) (12/30 0441) Pulse Rate:  [91-110] 108 (12/30 0441) Resp:  [18-30] 18 (12/30 0441) BP: (114-134)/(62-80) 127/63 (12/30 0441) SpO2:  [91 %-96 %] 91 % (12/30 0441) Weight:  [56.6 kg] 56.6 kg (12/29 1719) Last BM Date : 01/12/24  Physical Exam General:   Alert and oriented, pleasant.  Ill-appearing. Head:  Normocephalic and atraumatic. Eyes:  No icterus, sclera clear. Conjuctiva pink.  Mouth:  Without lesions, mucosa pink and moist.  Neck:  Supple, without thyromegaly or masses.   Lungs:  rhonchi bilaterally.  Nasal cannula in place. Abdomen:  Bowel sounds present, soft, nondistended.  Laying slightly on her right side for comfort.  TTP noted to RUQ, epigastrium, and RLQ.  RLQ/RUQ > epigastrium.  No hernias noted. No masses appreciated. Extremities:  Without clubbing or edema. Neurologic:  Alert and  oriented x4;  grossly normal neurologically. Psych:  Alert and cooperative.  Mildly anxious.  Intake/Output from previous day: 12/29 0701 - 12/30 0700 In: 1262.3 [IV Piggyback:1262.3] Out: -  Intake/Output this shift: No  intake/output data recorded.  Lab Results  Recent Labs    01/13/24 1816 01/14/24 0702  WBC 16.3* 19.3*  HGB 14.4 13.1  HCT 44.5 40.9  PLT 321 263   BMET Recent Labs    01/13/24 1816 01/14/24 0702  NA 134* 136  K 4.1 4.3  CL 98 100  CO2 20* 26  GLUCOSE 109* 112*  BUN 21* 13  CREATININE 1.05* 0.68  CALCIUM  9.5 9.3   LFT Recent Labs    01/13/24 1816 01/14/24 0702  PROT 7.5 6.7  ALBUMIN 4.3 3.7  AST 27 52*  ALT 13 38  ALKPHOS 125 129*  BILITOT 0.9 0.7   PT/INR No results for input(s): LABPROT, INR in the last 72 hours. Hepatitis Panel No results for input(s): HEPBSAG, HCVAB, HEPAIGM, HEPBIGM in the last 72 hours.  Studies/Results DG CHEST PORT 1 VIEW Result Date: 01/14/2024 CLINICAL DATA:  Tachypnea. EXAM: PORTABLE CHEST 1 VIEW COMPARISON:  10/02/2023 FINDINGS: Low lung volumes limit assessment. The heart is enlarged. Hazy bilateral opacities consistent with pleural effusions, right greater than left. Bronchovascular crowding versus central vascular congestion. No pneumothorax. Gaseous gastric distention in the upper abdomen. IMPRESSION: 1. Low lung volumes limit assessment. 2. Cardiomegaly with bilateral pleural effusions, right greater than left. 3. Bronchovascular crowding versus central vascular congestion. Electronically Signed   By: Andrea Gasman M.D.   On: 01/14/2024 19:07   US  Abdomen Limited Result Date: 01/14/2024 EXAM: Right Upper Quadrant Abdominal Ultrasound 01/14/2024 12:14:45 PM TECHNIQUE: Real-time ultrasonography of the right upper quadrant of the  abdomen was performed. COMPARISON: CT dated 01/13/2024. CLINICAL HISTORY: Elevated LFTs. FINDINGS: LIVER: Normal echogenicity. No evidence of mass. Hepatopetal flow in the portal vein. BILIARY SYSTEM: The gallbladder is surgically absent. Biliary ductal dilation with the common bile duct measuring up to 9.9 mm in diameter. OTHER: No right upper quadrant ascites. IMPRESSION: 1. Biliary ductal  dilation with the common bile duct measuring up to 9.9 mm in diameter. 2. Known pancreatic head lesion is better seen on same-day CT. consider ERCP/EUS for further evaluation. Electronically signed by: Michaeline Blanch MD 01/14/2024 01:59 PM EST RP Workstation: [id number]   CT ABDOMEN PELVIS W CONTRAST Result Date: 01/13/2024 EXAM: CT ABDOMEN AND PELVIS WITH CONTRAST 01/13/2024 07:47:19 PM TECHNIQUE: CT of the abdomen and pelvis was performed with the administration of 80 mL of iohexol  (OMNIPAQUE ) 300 MG/ML solution. Multiplanar reformatted images are provided for review. Automated exposure control, iterative reconstruction, and/or weight-based adjustment of the mA/kV was utilized to reduce the radiation dose to as low as reasonably achievable. COMPARISON: CT abdomen and pelvis 10/02/2023. CLINICAL HISTORY: RLQ abdominal pain. FINDINGS: LOWER CHEST: There are new patchy ground glass opacities in both lung bases. LIVER: The liver is mildly enlarged. GALLBLADDER AND BILE DUCTS: There is intrahepatic and extrahepatic biliary ductal dilatation which has increased from prior study. SPLEEN: No acute abnormality. PANCREAS: Complex multiloculated cystic mass is seen in the region of the pancreatic head measuring 3.5 x 5.9 cm, minimally increased in size. There is surrounding inflammatory stranding and fluid which has increased. There is new mild pancreatic ductal dilatation in the body of tail of the pancreas. ADRENAL GLANDS: No acute abnormality. KIDNEYS, URETERS AND BLADDER: There is a subcentimeter hypodensity in the left kidney which is too small to characterize, likely a cyst. There is scarring in the superior pole of the left kidney. No stones in the kidneys or ureters. No hydronephrosis. No perinephric or periureteral stranding. Urinary bladder is unremarkable. GI AND BOWEL: There is wall thickening and inflammation involving the proximal transverse colon abutting inflammatory fluid related to the pancreas. There is  some new wall thickening of the duodenum. The appendix is not visualized. Stomach demonstrates no acute abnormality. There is no bowel obstruction. PERITONEUM AND RETROPERITONEUM: There is a new small amount of free fluid in the pelvis. No free air. VASCULATURE: Aorta is normal in caliber. There are atherosclerotic calcifications of the aorta and iliac arteries. LYMPH NODES: No lymphadenopathy. REPRODUCTIVE ORGANS: No acute abnormality. BONES AND SOFT TISSUES: Lower lumbar fusion hardware is unchanged. There is a small fat containing umbilical hernia. No acute osseous abnormality. No focal soft tissue abnormality. IMPRESSION: 1. Complex multiloculated cystic mass in the region of the pancreatic head, minimally increased in size, with increased surrounding inflammatory stranding and fluid. Findings may be related to worsening infectious/inflammatory process and worsening acute pancreatitis. Cystic neoplasm of the pancreatic head not excluded. There is increasing pancreatic ductal and biliary ductal dilatation. Recommend EUS with possible FNA and surgical consultation. 2. Wall thickening and inflammation involving the proximal transverse colon abutting inflammatory fluid related to the pancreas, with new wall thickening of the duodenum. 3. New patchy ground glass opacities in both lung bases. Electronically signed by: Greig Pique MD 01/13/2024 08:28 PM EST RP Workstation: [id number]    Assessment  53 y.o. female with a history of COPD, HTN, chronic alcohol  use, dilated cardiomyopathy suspected to be secondary to alcohol  abuse, CHF with a EF <20% noted on echocardiogram in September 2025, Left ventricular thrombus maintained on Eliquis , acute on chronic pancreatitis  with pancreatic pseudocyst and complex fluid-filled cystic mass in the pancreatic head.  She was due to have EUS outpatient for further evaluation of pancreatic head mass however presented to [facility] with acute recurrent abdominal pain and admitted for  management of pancreatitis.  GI consulted to assist with management.  Acute on chronic pancreatitis, abdominal pain: Course complicated by complex multiloculated cystic mass in the pancreatic head with prior plans for outpatient EUS with Dr. Burnette with [facility], however it appears to have increased in size since September and in light of active illness would recommend postponing procedure. Etiology felt to be secondary to alcohol . US  completed yesterday wth bilar ductal dilation noted with CBD measuring up to 9.9 mm which could be normal in the setting of cholecystectomy. No mention of any overt stones.  Mild elevation in AST of 52 and alkaline phosphatase of 129 but normal T. bili and ALT.  No labs on file for today.  Yesterday she reported some significant abdominal pain and was unable to get comfortable, on examination today she reports her pain was 5/10 and manageable.  If she begins having significant worsening of abdominal pain then would recommend CTA and consideration for transfer to [facility] in light of her multiple comorbidities and the potential need for advanced endoscopic procedure such as ERCP/EUS.  Recommend close monitoring of I&O's given her CHF but also her need for adequate hydration in the setting of pancreatitis.  Had small amount of wine on Christmas Eve prior to this admission but states she otherwise has not had any alcohol  intake since her hospital discharge in September.   Given her multiple co morbidities agree with ongoing empiric antibiotics in the setting of pneumonia. CXR completed yesterday which noted bilateral pleural effusions. Has known EF of < 20% on recent ECHO in September.  Given her mild hypotension all of her chronic hypertensive meds have been on hold.   Concern for colitis on imaging, duodenal wall thickening: Suspect most likely reactive given there is wall thickening of the proximal transverse colon that abuts the inflammatory fluid from the pancreas.  There is also new wall thickening of the duodenum which could also be reactive in the setting of pancreatis. Has been started on empiric PPI therapy. If not resolved on follow up imaging or worsening then will need endoscopic evaluation at some point.    Plan / Recommendations  Continue supportive care with antiemetics and pain medication as needed per hospitalist Continue clear liquids Continue oral hydration versus gentle IV hydration Continue daily PPI Absolute EtOH cessation EUS as outpatient will likely need to be postponed given recent illness, Dr. Burnette should be notified. Continue to trend HFP, ordered for today.    LOS: 2 days   01/15/2024, 10:30 AM   Charmaine Melia, MSN, FNP-BC, AGACNP-BC [facility]   "

## 2024-01-15 NOTE — Progress Notes (Addendum)
 " PROGRESS NOTE  Suzanne Jackson, is a 53 y.o. female, DOB - [birth year], FMW:[id number]  Admit date - 01/13/2024   Admitting Physician Tully FORBES Carwin, MD  Outpatient Primary MD for the patient is Leonel Cole, MD  LOS - 2  Chief Complaint  Patient presents with   Abdominal Pain       Brief Narrative:  53 y.o. female with medical history significant for alcoholic pancreatitis, CHF, COPD hypertension admitted on 01/13/2023 with acute pancreatitis in the setting of ongoing alcohol  abuse.   -Assessment and Plan: 1)Acute alcoholic pancreatitis- -patient admits to alcohol  use (Wine) around Christmas  --CTAP WC-complex cystic mass in the pancreatic head minimally increased in size, surrounding stranding and fluid, worsening infectious/inflammatory process and worsening acute pancreatitis.  Cystic neoplasm of pancreatic head not excluded.  Pancreatic duct and biliary duct dilation.  EUS, possible FNA and surgical consultation recommended. -Patient follows with [facility], Dr. Dr. Kriss, She has EUS scheduled for 01/23/2024 with Dr. Burnette.. - GI consult appreciated -Still requiring frequent doses of IV Dilaudid  -As needed antiemetics WBC 16.3 >>19.3 - Hold off on further IV fluids given very low EF with risk for volume overload   2)History of Alcohol  Abuse-reports she and her husband have quit drinking alcohol --patient admits to alcohol  use (Wine) around Christmas  -c/n Thiamine , folic acid  and  multivitamins   3) Hx of ventricular thrombus - Continue full anticoagulation with Eliquis   4)Chronic Systolic CHF- stable and compensated, appears dehydrated.  Last echo 09/23/23 with  EF of less than 20%. - Lasix  on hold due to above --- Hold off on further IV fluids despite #1 number  5)Mild Transaminitis--- AST has normalized, alk phos has normalized   ? Rash-reports 2 small white patchy areas to right forearm that started here in the ED. Dilaudid  and Zofran  given in ED.  At this time no  culprit medications identified. -  Hypertension--- hold PTA BP meds due to soft BP ( Lasix  20 mg , losartan 25 mg daily, metoprolol  50 mg daily are all on hold)  Status is: Inpatient   Disposition: The patient is from: Home              Anticipated d/c is to: Home              Anticipated d/c date is: 3 days              Patient currently is not medically stable to d/c. Barriers: Not Clinically Stable- --uncontrolled abdominal pain still requiring IV narcotics  Code Status : -  Code Status: Full Code   Family Communication:   NA (patient is alert, awake and coherent)   DVT Prophylaxis  :   - SCDs  apixaban  (ELIQUIS ) tablet 5 mg   Lab Results  Component Value Date   PLT 263 01/14/2024   Inpatient Medications  Scheduled Meds:  apixaban   5 mg Oral BID   budesonide -glycopyrrolate -formoterol   2 puff Inhalation BID   folic acid   1 mg Oral Daily   hydrOXYzine   25 mg Oral TID   multivitamin with minerals  1 tablet Oral Daily   thiamine   100 mg Oral Daily   Or   thiamine   100 mg Intravenous Daily   Continuous Infusions:  azithromycin  Stopped (01/14/24 2304)   cefTRIAXone  (ROCEPHIN )  IV 2 g (01/14/24 2348)   PRN Meds:.acetaminophen  **OR** acetaminophen , albuterol , diphenhydrAMINE , HYDROmorphone  (DILAUDID ) injection, LORazepam  **OR** LORazepam , ondansetron  **OR** ondansetron  (ZOFRAN ) IV, polyethylene glycol   Anti-infectives (From admission, onward)  Start     Dose/Rate Route Frequency Ordered Stop   01/13/24 2300  cefTRIAXone  (ROCEPHIN ) 2 g in sodium chloride  0.9 % 100 mL IVPB        2 g 200 mL/hr over 30 Minutes Intravenous Every 24 hours 01/13/24 2247     01/13/24 2300  azithromycin  (ZITHROMAX ) 500 mg in sodium chloride  0.9 % 250 mL IVPB        500 mg 250 mL/hr over 60 Minutes Intravenous Every 24 hours 01/13/24 2247         Subjective: Suzanne Jackson today has no fevers,  No chest pain,   - No further emesis, - Tolerating oral fluids but oral intake overall is  not great - Requiring frequent doses of IV narcotics   Objective: Vitals:   01/14/24 1950 01/15/24 0108 01/15/24 0441 01/15/24 1344  BP:  120/62 127/63 111/63  Pulse:  (!) 110 (!) 108 (!) 110  Resp:  18 18   Temp:  98.6 F (37 C) 98.4 F (36.9 C) 99.1 F (37.3 C)  TempSrc:    Oral  SpO2: 95% 96% 91% 97%  Weight:      Height:        Intake/Output Summary (Last 24 hours) at 01/15/2024 1638 Last data filed at 01/15/2024 9357 Gross per 24 hour  Intake 263.27 ml  Output --  Net 263.27 ml   Filed Weights   01/13/24 1759 01/14/24 1719  Weight: 53.5 kg 56.6 kg    Physical Exam Gen:- Awake Alert, appears uncomfortable  HEENT:- [place].AT, No sclera icterus Neck-Supple Neck,No JVD,.  Lungs-air movement is fair, no significant wheezing CV- S1, S2 normal, regular  Abd-  +ve B.Sounds, Abd Soft, has abdominal discomfort on palpation without rebound or guarding,    Extremity/Skin:- No  edema, pedal pulses present  Psych-affect is appropriate, oriented x3 Neuro-no new focal deficits, no tremors  Data Reviewed: I have personally reviewed following labs and imaging studies  CBC: Recent Labs  Lab 01/13/24 1816 01/14/24 0702  WBC 16.3* 19.3*  HGB 14.4 13.1  HCT 44.5 40.9  MCV 93.5 95.8  PLT 321 263   Basic Metabolic Panel: Recent Labs  Lab 01/13/24 1816 01/14/24 0702  NA 134* 136  K 4.1 4.3  CL 98 100  CO2 20* 26  GLUCOSE 109* 112*  BUN 21* 13  CREATININE 1.05* 0.68  CALCIUM  9.5 9.3   GFR: Estimated Creatinine Clearance: 60.7 mL/min (by C-G formula based on SCr of 0.68 mg/dL). Liver Function Tests: Recent Labs  Lab 01/13/24 1816 01/14/24 0702 01/15/24 1525  AST 27 52* 21  ALT 13 38 17  ALKPHOS 125 129* 107  BILITOT 0.9 0.7 0.6  PROT 7.5 6.7 6.2*  ALBUMIN 4.3 3.7 3.3*   Recent Results (from the past 240 hours)  Resp panel by RT-PCR (RSV, Flu A&B, Covid) Anterior Nasal Swab     Status: None   Collection Time: 01/13/24  6:06 PM   Specimen: Anterior Nasal  Swab  Result Value Ref Range Status   SARS Coronavirus 2 by RT PCR NEGATIVE NEGATIVE Final    Comment: (NOTE) SARS-CoV-2 target nucleic acids are NOT DETECTED.  The SARS-CoV-2 RNA is generally detectable in upper respiratory specimens during the acute phase of infection. The lowest concentration of SARS-CoV-2 viral copies this assay can detect is 138 copies/mL. A negative result does not preclude SARS-Cov-2 infection and should not be used as the sole basis for treatment or other patient management decisions. A negative result may occur  with  improper specimen collection/handling, submission of specimen other than nasopharyngeal swab, presence of viral mutation(s) within the areas targeted by this assay, and inadequate number of viral copies(<138 copies/mL). A negative result must be combined with clinical observations, patient history, and epidemiological information. The expected result is Negative.  Fact Sheet for Patients:  bloggercourse.com  Fact Sheet for Healthcare Providers:  seriousbroker.it  This test is no t yet approved or cleared by the [place]  FDA and  has been authorized for detection and/or diagnosis of SARS-CoV-2 by FDA under an Emergency Use Authorization (EUA). This EUA will remain  in effect (meaning this test can be used) for the duration of the COVID-19 declaration under Section 564(b)(1) of the Act, 21 U.S.C.section 360bbb-3(b)(1), unless the authorization is terminated  or revoked sooner.       Influenza A by PCR NEGATIVE NEGATIVE Final   Influenza B by PCR NEGATIVE NEGATIVE Final    Comment: (NOTE) The Xpert Xpress SARS-CoV-2/FLU/RSV plus assay is intended as an aid in the diagnosis of influenza from Nasopharyngeal swab specimens and should not be used as a sole basis for treatment. Nasal washings and aspirates are unacceptable for Xpert Xpress SARS-CoV-2/FLU/RSV testing.  Fact Sheet for  Patients: bloggercourse.com  Fact Sheet for Healthcare Providers: seriousbroker.it  This test is not yet approved or cleared by the [place]  FDA and has been authorized for detection and/or diagnosis of SARS-CoV-2 by FDA under an Emergency Use Authorization (EUA). This EUA will remain in effect (meaning this test can be used) for the duration of the COVID-19 declaration under Section 564(b)(1) of the Act, 21 U.S.C. section 360bbb-3(b)(1), unless the authorization is terminated or revoked.     Resp Syncytial Virus by PCR NEGATIVE NEGATIVE Final    Comment: (NOTE) Fact Sheet for Patients: bloggercourse.com  Fact Sheet for Healthcare Providers: seriousbroker.it  This test is not yet approved or cleared by the [place]  FDA and has been authorized for detection and/or diagnosis of SARS-CoV-2 by FDA under an Emergency Use Authorization (EUA). This EUA will remain in effect (meaning this test can be used) for the duration of the COVID-19 declaration under Section 564(b)(1) of the Act, 21 U.S.C. section 360bbb-3(b)(1), unless the authorization is terminated or revoked.  Performed at [facility], [address] [place], [place] [address]     Radiology Studies: DG CHEST PORT 1 VIEW Result Date: 01/14/2024 CLINICAL DATA:  Tachypnea. EXAM: PORTABLE CHEST 1 VIEW COMPARISON:  10/02/2023 FINDINGS: Low lung volumes limit assessment. The heart is enlarged. Hazy bilateral opacities consistent with pleural effusions, right greater than left. Bronchovascular crowding versus central vascular congestion. No pneumothorax. Gaseous gastric distention in the upper abdomen. IMPRESSION: 1. Low lung volumes limit assessment. 2. Cardiomegaly with bilateral pleural effusions, right greater than left. 3. Bronchovascular crowding versus central vascular congestion. Electronically Signed   By: Andrea Gasman M.D.   On: 01/14/2024 19:07   US  Abdomen Limited Result Date: 01/14/2024 EXAM: Right Upper Quadrant Abdominal Ultrasound 01/14/2024 12:14:45 PM TECHNIQUE: Real-time ultrasonography of the right upper quadrant of the abdomen was performed. COMPARISON: CT dated 01/13/2024. CLINICAL HISTORY: Elevated LFTs. FINDINGS: LIVER: Normal echogenicity. No evidence of mass. Hepatopetal flow in the portal vein. BILIARY SYSTEM: The gallbladder is surgically absent. Biliary ductal dilation with the common bile duct measuring up to 9.9 mm in diameter. OTHER: No right upper quadrant ascites. IMPRESSION: 1. Biliary ductal dilation with the common bile duct measuring up to 9.9 mm in diameter. 2. Known pancreatic head lesion is  better seen on same-day CT. consider ERCP/EUS for further evaluation. Electronically signed by: Michaeline Blanch MD 01/14/2024 01:59 PM EST RP Workstation: [id number]   CT ABDOMEN PELVIS W CONTRAST Result Date: 01/13/2024 EXAM: CT ABDOMEN AND PELVIS WITH CONTRAST 01/13/2024 07:47:19 PM TECHNIQUE: CT of the abdomen and pelvis was performed with the administration of 80 mL of iohexol  (OMNIPAQUE ) 300 MG/ML solution. Multiplanar reformatted images are provided for review. Automated exposure control, iterative reconstruction, and/or weight-based adjustment of the mA/kV was utilized to reduce the radiation dose to as low as reasonably achievable. COMPARISON: CT abdomen and pelvis 10/02/2023. CLINICAL HISTORY: RLQ abdominal pain. FINDINGS: LOWER CHEST: There are new patchy ground glass opacities in both lung bases. LIVER: The liver is mildly enlarged. GALLBLADDER AND BILE DUCTS: There is intrahepatic and extrahepatic biliary ductal dilatation which has increased from prior study. SPLEEN: No acute abnormality. PANCREAS: Complex multiloculated cystic mass is seen in the region of the pancreatic head measuring 3.5 x 5.9 cm, minimally increased in size. There is surrounding inflammatory stranding and fluid which  has increased. There is new mild pancreatic ductal dilatation in the body of tail of the pancreas. ADRENAL GLANDS: No acute abnormality. KIDNEYS, URETERS AND BLADDER: There is a subcentimeter hypodensity in the left kidney which is too small to characterize, likely a cyst. There is scarring in the superior pole of the left kidney. No stones in the kidneys or ureters. No hydronephrosis. No perinephric or periureteral stranding. Urinary bladder is unremarkable. GI AND BOWEL: There is wall thickening and inflammation involving the proximal transverse colon abutting inflammatory fluid related to the pancreas. There is some new wall thickening of the duodenum. The appendix is not visualized. Stomach demonstrates no acute abnormality. There is no bowel obstruction. PERITONEUM AND RETROPERITONEUM: There is a new small amount of free fluid in the pelvis. No free air. VASCULATURE: Aorta is normal in caliber. There are atherosclerotic calcifications of the aorta and iliac arteries. LYMPH NODES: No lymphadenopathy. REPRODUCTIVE ORGANS: No acute abnormality. BONES AND SOFT TISSUES: Lower lumbar fusion hardware is unchanged. There is a small fat containing umbilical hernia. No acute osseous abnormality. No focal soft tissue abnormality. IMPRESSION: 1. Complex multiloculated cystic mass in the region of the pancreatic head, minimally increased in size, with increased surrounding inflammatory stranding and fluid. Findings may be related to worsening infectious/inflammatory process and worsening acute pancreatitis. Cystic neoplasm of the pancreatic head not excluded. There is increasing pancreatic ductal and biliary ductal dilatation. Recommend EUS with possible FNA and surgical consultation. 2. Wall thickening and inflammation involving the proximal transverse colon abutting inflammatory fluid related to the pancreas, with new wall thickening of the duodenum. 3. New patchy ground glass opacities in both lung bases. Electronically  signed by: Greig Pique MD 01/13/2024 08:28 PM EST RP Workstation: [id number]   Scheduled Meds:  apixaban   5 mg Oral BID   budesonide -glycopyrrolate -formoterol   2 puff Inhalation BID   folic acid   1 mg Oral Daily   hydrOXYzine   25 mg Oral TID   multivitamin with minerals  1 tablet Oral Daily   thiamine   100 mg Oral Daily   Or   thiamine   100 mg Intravenous Daily   Continuous Infusions:  azithromycin  Stopped (01/14/24 2304)   cefTRIAXone  (ROCEPHIN )  IV 2 g (01/14/24 2348)     LOS: 2 days   Rendall Carwin M.D on 01/15/2024 at 4:38 PM  Go to [email or web address] - for contact info  [facility] - Office  [phone]  If 7PM-7AM, please contact night-coverage [email or web address]  01/15/2024, 4:38 PM    "

## 2024-01-15 NOTE — Progress Notes (Deleted)
" ° °  Brief Progress Note   _____________________________________________________________________________________________________________  Patient Name: Suzanne Jackson Patient DOB: [birth year] Date: @TODAY @    No barriers at this time for d/c home tomorrow. 01/16/2024 _____________________________________________________________________________________________________________  [facility] RN Exped[employer] Ronal DELENA Bald Please contact us  directly via secure chat (search for [facility]) or by calling us  at [phone] [facility]).  "

## 2024-01-16 ENCOUNTER — Inpatient Hospital Stay (HOSPITAL_COMMUNITY)

## 2024-01-16 DIAGNOSIS — F17210 Nicotine dependence, cigarettes, uncomplicated: Secondary | ICD-10-CM | POA: Diagnosis not present

## 2024-01-16 DIAGNOSIS — R933 Abnormal findings on diagnostic imaging of other parts of digestive tract: Secondary | ICD-10-CM | POA: Diagnosis not present

## 2024-01-16 DIAGNOSIS — K852 Alcohol induced acute pancreatitis without necrosis or infection: Secondary | ICD-10-CM | POA: Diagnosis not present

## 2024-01-16 DIAGNOSIS — K838 Other specified diseases of biliary tract: Secondary | ICD-10-CM | POA: Diagnosis not present

## 2024-01-16 DIAGNOSIS — J90 Pleural effusion, not elsewhere classified: Secondary | ICD-10-CM | POA: Diagnosis not present

## 2024-01-16 DIAGNOSIS — K859 Acute pancreatitis without necrosis or infection, unspecified: Secondary | ICD-10-CM | POA: Diagnosis not present

## 2024-01-16 DIAGNOSIS — F1090 Alcohol use, unspecified, uncomplicated: Secondary | ICD-10-CM | POA: Diagnosis not present

## 2024-01-16 LAB — GLUCOSE, CAPILLARY
Glucose-Capillary: 101 mg/dL — ABNORMAL HIGH (ref 70–99)
Glucose-Capillary: 81 mg/dL (ref 70–99)
Glucose-Capillary: 89 mg/dL (ref 70–99)
Glucose-Capillary: 90 mg/dL (ref 70–99)

## 2024-01-16 LAB — CBC
HCT: 34.5 % — ABNORMAL LOW (ref 36.0–46.0)
Hemoglobin: 11 g/dL — ABNORMAL LOW (ref 12.0–15.0)
MCH: 30.7 pg (ref 26.0–34.0)
MCHC: 31.9 g/dL (ref 30.0–36.0)
MCV: 96.4 fL (ref 80.0–100.0)
Platelets: 244 K/uL (ref 150–400)
RBC: 3.58 MIL/uL — ABNORMAL LOW (ref 3.87–5.11)
RDW: 17.4 % — ABNORMAL HIGH (ref 11.5–15.5)
WBC: 16.5 K/uL — ABNORMAL HIGH (ref 4.0–10.5)
nRBC: 0 % (ref 0.0–0.2)

## 2024-01-16 LAB — RENAL FUNCTION PANEL
Albumin: 3.2 g/dL — ABNORMAL LOW (ref 3.5–5.0)
Anion gap: 14 (ref 5–15)
BUN: 7 mg/dL (ref 6–20)
CO2: 24 mmol/L (ref 22–32)
Calcium: 9.3 mg/dL (ref 8.9–10.3)
Chloride: 97 mmol/L — ABNORMAL LOW (ref 98–111)
Creatinine, Ser: 0.32 mg/dL — ABNORMAL LOW (ref 0.44–1.00)
GFR, Estimated: >60 mL/min
Glucose, Bld: 69 mg/dL — ABNORMAL LOW (ref 70–99)
Phosphorus: 2.1 mg/dL — ABNORMAL LOW (ref 2.5–4.6)
Potassium: 3.7 mmol/L (ref 3.5–5.1)
Sodium: 135 mmol/L (ref 135–145)

## 2024-01-16 LAB — LIPASE, BLOOD: Lipase: 168 U/L — ABNORMAL HIGH (ref 11–51)

## 2024-01-16 MED ORDER — SPIRONOLACTONE 25 MG PO TABS
25.0000 mg | ORAL_TABLET | Freq: Every day | ORAL | Status: DC
  Administered 2024-01-17: 25 mg via ORAL
  Filled 2024-01-16: qty 1

## 2024-01-16 MED ORDER — DULOXETINE HCL 60 MG PO CPEP
60.0000 mg | ORAL_CAPSULE | Freq: Every day | ORAL | Status: DC
  Administered 2024-01-16 – 2024-01-17 (×2): 60 mg via ORAL
  Filled 2024-01-16 (×2): qty 1

## 2024-01-16 MED ORDER — SODIUM CHLORIDE 0.9 % IV SOLN: INTRAVENOUS | Status: AC | PRN

## 2024-01-16 MED ORDER — MONTELUKAST SODIUM 10 MG PO TABS
10.0000 mg | ORAL_TABLET | Freq: Every day | ORAL | Status: DC
  Administered 2024-01-16: 10 mg via ORAL
  Filled 2024-01-16: qty 1

## 2024-01-16 NOTE — Plan of Care (Signed)

## 2024-01-16 NOTE — Progress Notes (Signed)
 Nurse at bedside,patient alert and oriented times four this morning.Blood pressure was 112/46,heart rate 110,Dr [employer] notified . Holding the Aldactone this morning. Plan of care on going.

## 2024-01-16 NOTE — TOC Progression Note (Signed)
 Transition of Care [facility]) - Progression Note    Patient Details  Name: Suzanne Jackson MRN: [id number] Date of Birth: [birth year]  [facility] CM/SW Contact  Sharlyne Stabs, RN Phone Number: 01/16/2024, 10:59 AM  Clinical Narrative:   Patient not ready for discharge. Substance abuse resources added to AVS, as well as [facility].    Expected Discharge Plan: Home/Self Care Barriers to Discharge: Continued Medical Work up    Expected Discharge Plan and Services In-house Referral: Clinical Social Work     Living arrangements for the past 2 months: Single Family Home        Social Drivers of Health (SDOH) Interventions SDOH Screenings   Food Insecurity: No Food Insecurity (01/14/2024)  Housing: Low Risk (01/14/2024)  Transportation Needs: No Transportation Needs (01/14/2024)  Utilities: Not At Risk (01/14/2024)  Financial Resource Strain: Low Risk (08/07/2023)   Received from [facility]  Physical Activity: Inactive (08/07/2023)   Received from [facility]  Social Connections: Patient Declined (08/07/2023)   Received from [facility]  Stress: Stress Concern Present (08/07/2023)   Received from [facility]  Tobacco Use: High Risk (01/13/2024)    Readmission Risk Interventions    01/14/2024    8:22 AM  Readmission Risk Prevention Plan  Transportation Screening Complete  HRI or Home Care Consult Complete  Social Work Consult for Recovery Care Planning/Counseling Complete  Palliative Care Screening Not Applicable  Medication Review Oceanographer) Complete

## 2024-01-16 NOTE — Progress Notes (Signed)
 "  Gastroenterology Progress Note   Referring Provider: No ref. provider found Primary Care Physician:  Leonel Cole, MD Primary Gastroenterologist:  [facility]  Patient ID: Suzanne Jackson; [id number]; Oct 01, 1970   Subjective:    Patient requiring pain medication about every 3 hours. States her pain is ruq. States she did not require any overnight however. She denies vomiting since admission. She is tolerating liquids. She feels hungry and desires advancing diet. Had one loose brown stool today.  Objective:   Vital signs in last 24 hours: Temp:  [98 F (36.7 C)-99.1 F (37.3 C)] 98 F (36.7 C) (12/31 0424) Pulse Rate:  [98-110] 98 (12/31 0424) Resp:  [19-20] 19 (12/31 0424) BP: (111-123)/(57-63) 123/57 (12/31 0424) SpO2:  [93 %-97 %] 93 % (12/31 0424) Last BM Date : 01/13/24 General:   Alert,  Well-developed, well-nourished, pleasant and cooperative in NAD Head:  Normocephalic and atraumatic. Eyes:  Sclera clear, no icterus.   Abdomen:  Soft,  nondistended. Normal bowel sounds, without guarding, and without rebound.  Moderate epigastric/ruq tenderness. Extremities:  Without clubbing, deformity or edema. Neurologic:  Alert and  oriented x4;  grossly normal neurologically. Skin:  Intact without significant lesions or rashes. Psych:  Alert and cooperative. Normal mood and affect.  Intake/Output from previous day: 12/30 0701 - 12/31 0700 In: 1163 [P.O.:250; I.V.:489; IV Piggyback:424.1] Out: -  Intake/Output this shift: Total I/O In: 1163 [P.O.:250; I.V.:489; IV Piggyback:424.1] Out: -   Lab Results: CBC Recent Labs    01/13/24 1816 01/14/24 0702 01/16/24 0401  WBC 16.3* 19.3* 16.5*  HGB 14.4 13.1 11.0*  HCT 44.5 40.9 34.5*  MCV 93.5 95.8 96.4  PLT 321 263 244   BMET Recent Labs    01/13/24 1816 01/14/24 0702 01/16/24 0401  NA 134* 136 135  K 4.1 4.3 3.7  CL 98 100 97*  CO2 20* 26 24  GLUCOSE 109* 112* 69*  BUN 21* 13 7  CREATININE 1.05* 0.68 0.32*   CALCIUM  9.5 9.3 9.3   LFTs Recent Labs    01/13/24 1816 01/14/24 0702 01/15/24 1525 01/16/24 0401  BILITOT 0.9 0.7 0.6  --   BILIDIR  --   --  0.2  --   IBILI  --   --  0.3  --   ALKPHOS 125 129* 107  --   AST 27 52* 21  --   ALT 13 38 17  --   PROT 7.5 6.7 6.2*  --   ALBUMIN 4.3 3.7 3.3* 3.2*   Recent Labs    01/13/24 1816 01/16/24 0401  LIPASE >2,800* 168*   PT/INR No results for input(s): LABPROT, INR in the last 72 hours.       Imaging Studies: DG CHEST PORT 1 VIEW Result Date: 01/14/2024 CLINICAL DATA:  Tachypnea. EXAM: PORTABLE CHEST 1 VIEW COMPARISON:  10/02/2023 FINDINGS: Low lung volumes limit assessment. The heart is enlarged. Hazy bilateral opacities consistent with pleural effusions, right greater than left. Bronchovascular crowding versus central vascular congestion. No pneumothorax. Gaseous gastric distention in the upper abdomen. IMPRESSION: 1. Low lung volumes limit assessment. 2. Cardiomegaly with bilateral pleural effusions, right greater than left. 3. Bronchovascular crowding versus central vascular congestion. Electronically Signed   By: Andrea Gasman M.D.   On: 01/14/2024 19:07   US  Abdomen Limited Result Date: 01/14/2024 EXAM: Right Upper Quadrant Abdominal Ultrasound 01/14/2024 12:14:45 PM TECHNIQUE: Real-time ultrasonography of the right upper quadrant of the abdomen was performed. COMPARISON: CT dated 01/13/2024. CLINICAL HISTORY: Elevated LFTs. FINDINGS:  LIVER: Normal echogenicity. No evidence of mass. Hepatopetal flow in the portal vein. BILIARY SYSTEM: The gallbladder is surgically absent. Biliary ductal dilation with the common bile duct measuring up to 9.9 mm in diameter. OTHER: No right upper quadrant ascites. IMPRESSION: 1. Biliary ductal dilation with the common bile duct measuring up to 9.9 mm in diameter. 2. Known pancreatic head lesion is better seen on same-day CT. consider ERCP/EUS for further evaluation. Electronically signed by:  Michaeline Blanch MD 01/14/2024 01:59 PM EST RP Workstation: [id number]   CT ABDOMEN PELVIS W CONTRAST Result Date: 01/13/2024 EXAM: CT ABDOMEN AND PELVIS WITH CONTRAST 01/13/2024 07:47:19 PM TECHNIQUE: CT of the abdomen and pelvis was performed with the administration of 80 mL of iohexol  (OMNIPAQUE ) 300 MG/ML solution. Multiplanar reformatted images are provided for review. Automated exposure control, iterative reconstruction, and/or weight-based adjustment of the mA/kV was utilized to reduce the radiation dose to as low as reasonably achievable. COMPARISON: CT abdomen and pelvis 10/02/2023. CLINICAL HISTORY: RLQ abdominal pain. FINDINGS: LOWER CHEST: There are new patchy ground glass opacities in both lung bases. LIVER: The liver is mildly enlarged. GALLBLADDER AND BILE DUCTS: There is intrahepatic and extrahepatic biliary ductal dilatation which has increased from prior study. SPLEEN: No acute abnormality. PANCREAS: Complex multiloculated cystic mass is seen in the region of the pancreatic head measuring 3.5 x 5.9 cm, minimally increased in size. There is surrounding inflammatory stranding and fluid which has increased. There is new mild pancreatic ductal dilatation in the body of tail of the pancreas. ADRENAL GLANDS: No acute abnormality. KIDNEYS, URETERS AND BLADDER: There is a subcentimeter hypodensity in the left kidney which is too small to characterize, likely a cyst. There is scarring in the superior pole of the left kidney. No stones in the kidneys or ureters. No hydronephrosis. No perinephric or periureteral stranding. Urinary bladder is unremarkable. GI AND BOWEL: There is wall thickening and inflammation involving the proximal transverse colon abutting inflammatory fluid related to the pancreas. There is some new wall thickening of the duodenum. The appendix is not visualized. Stomach demonstrates no acute abnormality. There is no bowel obstruction. PERITONEUM AND RETROPERITONEUM: There is a new small  amount of free fluid in the pelvis. No free air. VASCULATURE: Aorta is normal in caliber. There are atherosclerotic calcifications of the aorta and iliac arteries. LYMPH NODES: No lymphadenopathy. REPRODUCTIVE ORGANS: No acute abnormality. BONES AND SOFT TISSUES: Lower lumbar fusion hardware is unchanged. There is a small fat containing umbilical hernia. No acute osseous abnormality. No focal soft tissue abnormality. IMPRESSION: 1. Complex multiloculated cystic mass in the region of the pancreatic head, minimally increased in size, with increased surrounding inflammatory stranding and fluid. Findings may be related to worsening infectious/inflammatory process and worsening acute pancreatitis. Cystic neoplasm of the pancreatic head not excluded. There is increasing pancreatic ductal and biliary ductal dilatation. Recommend EUS with possible FNA and surgical consultation. 2. Wall thickening and inflammation involving the proximal transverse colon abutting inflammatory fluid related to the pancreas, with new wall thickening of the duodenum. 3. New patchy ground glass opacities in both lung bases. Electronically signed by: Greig Pique MD 01/13/2024 08:28 PM EST RP Workstation: [id number]  [2 weeks]  Assessment:   53 y.o. female with a history of COPD, HTN, chronic alcohol  use, dilated cardiomyopathy suspected to be secondary to alcohol  abuse, CHF with a EF <20% noted on echocardiogram in September 2025, Left ventricular thrombus maintained on Eliquis , acute on chronic pancreatitis with pancreatic pseudocyst and complex fluid-filled cystic mass in the  pancreatic head.  She was due to have EUS outpatient for further evaluation of pancreatic head mass however presented to [facility] with acute recurrent abdominal pain and admitted for management of pancreatitis.  GI consulted to assist with management.   Acute on chronic pancreatitis, abdominal pain: Course complicated by complex multiloculated cystic mass in the  pancreatic head with prior plans for outpatient EUS with Dr. Burnette with [facility], however it appears to have increased in size since September and in light of active illness would recommend postponing procedure. Etiology felt to be secondary to alcohol . US  completed yesterday wth bilary ductal dilation noted with CBD measuring up to 9.9 mm and noted to have increased intrahepatic and extrahepatic biliary dilation on CT this admission compared to 09/2023. No mention of any overt stones.  Mild elevation in AST of 52 and alkaline phosphatase of 129 but normalized.  She notes having small amount of wine on Christmas Eve but states she otherwise has not had any alcohol  intake since her hospital discharge in September.    Yesterday she reported some significant abdominal pain and was unable to get comfortable. She reports ongoing need for pain medication but was able to go from 1am to 930am without any need for medication. She desires possibly advancing her diet stating she is tolerating her liquids. Went from clears to full liquids today.  CXR with improving pleural effusions and vascular congestion.        Given her multiple co morbidities agree with ongoing empiric antibiotics in the setting of pneumonia. CXR completed yesterday which noted bilateral pleural effusions. Has known EF of < 20% on recent ECHO in September.  Given her mild hypotension all of her chronic hypertensive meds have been on hold.   Concern for colitis on imaging, duodenal wall thickening: Suspect most likely reactive given there is wall thickening of the proximal transverse colon that abuts the inflammatory fluid from the pancreas. There is also new wall thickening of the duodenum which could also be reactive in the setting of pancreatis. Has been started on empiric PPI therapy.    Plan:   Would continue full liquids today, if tolerated can consider soft low fat diet tomorrow. Consider giving weight based pancreatic enzymes (ie  Creon 72000 with meals and [id number] with snacks). Consider backing down to oral hydration as her oral intake has improved, and given CHF. Strict I/Os. Daily weights. Continue PPI. Absolute alcohol  cessation. EUS as outpatient, timing per Dr. Burnette given recent illness. LFTs tomorrow.  If worsening pain, may need to consider repeat imaging.    LOS: 3 days   Sonny RAMAN. Ezzard RIGGERS [facility] [phone] 12/31/20256:49 AM    "

## 2024-01-16 NOTE — Progress Notes (Addendum)
 " PROGRESS NOTE    Suzanne Jackson  FMW:[id number] DOB: 06-29-1970 DOA: 01/13/2024 PCP: Leonel Cole, MD   Brief Narrative:   53 y.o. female with medical history significant for alcoholic pancreatitis, CHF, COPD hypertension admitted on 01/13/2023 with acute pancreatitis in the setting of ongoing alcohol  abuse.   Advanced diet to full liquid diet from clear liquids on 01/16/2024.  She has an EUS scheduled as an outpatient by Dr. Burnette on 01/23/2024. Lives at home with her husband.  Assessment & Plan:  Principal Problem:   Acute pancreatitis Active Problems:   Hypertension   Depression   Chronic HFrEF (heart failure with reduced ejection fraction) (HCC)   COPD (chronic obstructive pulmonary disease) (HCC)   Pancreatic lesion   Acute alcoholic pancreatitis, POA: Improving symptoms.  Lipase is trending downwards.  CT of the abdomen pelvis with contrast showed complex Oestreich mass in the pancreatic head minimally increased in size, surrounding stranding and fluid, worsening infectious/inflammatory process and worsening acute pancreatitis.  There is evidence of pancreatic duct and biliary ductal dilatation. -Advanced diet from liquids to full liquids on 01/06/2024. - Continue with intravenous Dilaudid  1q milligram every 3 hours for severe pain.  Complex cystic pancreatic mass, POA: GI consulted.  No acute interventions needed.  Outpatient follow-up with GI on 01/23/2024 for EUS.  Alcoholic abuse, POA: Continue thiamine , folate and multivitamins.  History of ventricular thrombus, POA: Continue with full anticoagulation with Eliquis   Chronic systolic heart failure, POA: Not in exacerbation.  Last echo done on 09/23/2023 showed EF of less than 20%.  She follows up outpatient with cardiologist at [facility]. Restarted spironolactone 25 mg daily on 01/06/2024.  Continue to hold metoprolol  succinate, Farxiga as well as Entresto in the setting of borderline blood pressure.  Restart once  appropriate.  Transaminitis in the setting of alcohol  abuse.  Follow closely.  Hypertension, blood pressure is on the softer side, holding scheduled antihypertensives at this point.  Disposition: Lives at home with her husband.  She is independent in activities of daily life.  DVT prophylaxis:  apixaban  (ELIQUIS ) tablet 5 mg     Code Status: Full Code Family Communication: None at the bedside Status is: Inpatient Remains inpatient appropriate because: Acute pancreatitis    Subjective:  Abdominal pain is improving, currently, she is rating it as 6 out of 10.  She is on clear liquid diet and we spoke about advancing her diet to a full liquid diet.  She has a history of heart failure with reduced ejection fraction and follows up with a cardiologist at [facility].  She lives at home with her husband.  She also spoke to me about outpatient EUS which is scheduled for 01/23/2024 by [facility], Dr. Burnette  Examination:  General exam: Appears calm and comfortable  Respiratory system: Clear to auscultation. Respiratory effort normal. Cardiovascular system: S1 & S2 heard, RRR. No JVD, murmurs, rubs, gallops or clicks. No pedal edema. Gastrointestinal system: Abdomen is nondistended, soft.  Tenderness to palpation in the epigastric region.  No organomegaly or masses felt. Normal bowel sounds heard. Central nervous system: Alert and oriented. No focal neurological deficits. Extremities: Symmetric 5 x 5 power. Skin: No rashes, lesions or ulcers Psychiatry: Judgement and insight appear normal. Mood & affect appropriate.       Diet Orders (From admission, onward)     Start     Ordered   01/16/24 0853  Diet full liquid Room service appropriate? Yes; Fluid consistency: Thin  Diet effective now  Question Answer Comment  Room service appropriate? Yes   Fluid consistency: Thin      01/16/24 0852            Objective: Vitals:   01/15/24 1344 01/15/24 1954 01/16/24 0424 01/16/24 0757   BP: 111/63 115/63 (!) 123/57   Pulse: (!) 110 (!) 106 98   Resp:  20 19   Temp: 99.1 F (37.3 C) 98.2 F (36.8 C) 98 F (36.7 C)   TempSrc: Oral Oral Oral   SpO2: 97% 96% 93% 94%  Weight:      Height:        Intake/Output Summary (Last 24 hours) at 01/16/2024 0852 Last data filed at 01/16/2024 0424 Gross per 24 hour  Intake 1163.03 ml  Output --  Net 1163.03 ml   Filed Weights   01/13/24 1759 01/14/24 1719  Weight: 53.5 kg 56.6 kg    Scheduled Meds:  apixaban   5 mg Oral BID   budesonide -glycopyrrolate -formoterol   2 puff Inhalation BID   folic acid   1 mg Oral Daily   hydrOXYzine   25 mg Oral TID   multivitamin with minerals  1 tablet Oral Daily   thiamine   100 mg Oral Daily   Or   thiamine   100 mg Intravenous Daily   Continuous Infusions:  sodium chloride  10 mL/hr at 01/15/24 2230   azithromycin  500 mg (01/15/24 2229)   cefTRIAXone  (ROCEPHIN )  IV 2 g (01/16/24 0050)   lactated ringers  50 mL/hr at 01/15/24 1926    Nutritional status     Body mass index is 23.58 kg/m.  Data Reviewed:   CBC: Recent Labs  Lab 01/13/24 1816 01/14/24 0702 01/16/24 0401  WBC 16.3* 19.3* 16.5*  HGB 14.4 13.1 11.0*  HCT 44.5 40.9 34.5*  MCV 93.5 95.8 96.4  PLT 321 263 244   Basic Metabolic Panel: Recent Labs  Lab 01/13/24 1816 01/14/24 0702 01/16/24 0401  NA 134* 136 135  K 4.1 4.3 3.7  CL 98 100 97*  CO2 20* 26 24  GLUCOSE 109* 112* 69*  BUN 21* 13 7  CREATININE 1.05* 0.68 0.32*  CALCIUM  9.5 9.3 9.3  PHOS  --   --  2.1*   GFR: Estimated Creatinine Clearance: 60.7 mL/min (A) (by C-G formula based on SCr of 0.32 mg/dL (L)). Liver Function Tests: Recent Labs  Lab 01/13/24 1816 01/14/24 0702 01/15/24 1525 01/16/24 0401  AST 27 52* 21  --   ALT 13 38 17  --   ALKPHOS 125 129* 107  --   BILITOT 0.9 0.7 0.6  --   PROT 7.5 6.7 6.2*  --   ALBUMIN 4.3 3.7 3.3* 3.2*   Recent Labs  Lab 01/13/24 1816 01/16/24 0401  LIPASE >2,800* 168*   No results for  input(s): AMMONIA in the last 168 hours. Coagulation Profile: No results for input(s): INR, PROTIME in the last 168 hours. Cardiac Enzymes: No results for input(s): CKTOTAL, CKMB, CKMBINDEX, TROPONINI in the last 168 hours. BNP (last 3 results) No results for input(s): PROBNP in the last 8760 hours. HbA1C: No results for input(s): HGBA1C in the last 72 hours. CBG: Recent Labs  Lab 01/15/24 0039 01/15/24 0715 01/15/24 1612 01/16/24 0006 01/16/24 0815  GLUCAP 103* 99 75 90 89   Lipid Profile: No results for input(s): CHOL, HDL, LDLCALC, TRIG, CHOLHDL, LDLDIRECT in the last 72 hours. Thyroid Function Tests: No results for input(s): TSH, T4TOTAL, FREET4, T3FREE, THYROIDAB in the last 72 hours. Anemia Panel: No results for input(s):  VITAMINB12, FOLATE, FERRITIN, TIBC, IRON, RETICCTPCT in the last 72 hours. Sepsis Labs: No results for input(s): PROCALCITON, LATICACIDVEN in the last 168 hours.  Recent Results (from the past 240 hours)  Resp panel by RT-PCR (RSV, Flu A&B, Covid) Anterior Nasal Swab     Status: None   Collection Time: 01/13/24  6:06 PM   Specimen: Anterior Nasal Swab  Result Value Ref Range Status   SARS Coronavirus 2 by RT PCR NEGATIVE NEGATIVE Final    Comment: (NOTE) SARS-CoV-2 target nucleic acids are NOT DETECTED.  The SARS-CoV-2 RNA is generally detectable in upper respiratory specimens during the acute phase of infection. The lowest concentration of SARS-CoV-2 viral copies this assay can detect is 138 copies/mL. A negative result does not preclude SARS-Cov-2 infection and should not be used as the sole basis for treatment or other patient management decisions. A negative result may occur with  improper specimen collection/handling, submission of specimen other than nasopharyngeal swab, presence of viral mutation(s) within the areas targeted by this assay, and inadequate number of viral copies(<138  copies/mL). A negative result must be combined with clinical observations, patient history, and epidemiological information. The expected result is Negative.  Fact Sheet for Patients:  bloggercourse.com  Fact Sheet for Healthcare Providers:  seriousbroker.it  This test is no t yet approved or cleared by the [place]  FDA and  has been authorized for detection and/or diagnosis of SARS-CoV-2 by FDA under an Emergency Use Authorization (EUA). This EUA will remain  in effect (meaning this test can be used) for the duration of the COVID-19 declaration under Section 564(b)(1) of the Act, 21 U.S.C.section 360bbb-3(b)(1), unless the authorization is terminated  or revoked sooner.       Influenza A by PCR NEGATIVE NEGATIVE Final   Influenza B by PCR NEGATIVE NEGATIVE Final    Comment: (NOTE) The Xpert Xpress SARS-CoV-2/FLU/RSV plus assay is intended as an aid in the diagnosis of influenza from Nasopharyngeal swab specimens and should not be used as a sole basis for treatment. Nasal washings and aspirates are unacceptable for Xpert Xpress SARS-CoV-2/FLU/RSV testing.  Fact Sheet for Patients: bloggercourse.com  Fact Sheet for Healthcare Providers: seriousbroker.it  This test is not yet approved or cleared by the [place]  FDA and has been authorized for detection and/or diagnosis of SARS-CoV-2 by FDA under an Emergency Use Authorization (EUA). This EUA will remain in effect (meaning this test can be used) for the duration of the COVID-19 declaration under Section 564(b)(1) of the Act, 21 U.S.C. section 360bbb-3(b)(1), unless the authorization is terminated or revoked.     Resp Syncytial Virus by PCR NEGATIVE NEGATIVE Final    Comment: (NOTE) Fact Sheet for Patients: bloggercourse.com  Fact Sheet for Healthcare  Providers: seriousbroker.it  This test is not yet approved or cleared by the [place]  FDA and has been authorized for detection and/or diagnosis of SARS-CoV-2 by FDA under an Emergency Use Authorization (EUA). This EUA will remain in effect (meaning this test can be used) for the duration of the COVID-19 declaration under Section 564(b)(1) of the Act, 21 U.S.C. section 360bbb-3(b)(1), unless the authorization is terminated or revoked.  Performed at [facility], [address], [place], [place] [address]          Radiology Studies: DG CHEST PORT 1 VIEW Result Date: 01/14/2024 CLINICAL DATA:  Tachypnea. EXAM: PORTABLE CHEST 1 VIEW COMPARISON:  10/02/2023 FINDINGS: Low lung volumes limit assessment. The heart is enlarged. Hazy bilateral opacities consistent with pleural effusions, right greater  than left. Bronchovascular crowding versus central vascular congestion. No pneumothorax. Gaseous gastric distention in the upper abdomen. IMPRESSION: 1. Low lung volumes limit assessment. 2. Cardiomegaly with bilateral pleural effusions, right greater than left. 3. Bronchovascular crowding versus central vascular congestion. Electronically Signed   By: Andrea Gasman M.D.   On: 01/14/2024 19:07   US  Abdomen Limited Result Date: 01/14/2024 EXAM: Right Upper Quadrant Abdominal Ultrasound 01/14/2024 12:14:45 PM TECHNIQUE: Real-time ultrasonography of the right upper quadrant of the abdomen was performed. COMPARISON: CT dated 01/13/2024. CLINICAL HISTORY: Elevated LFTs. FINDINGS: LIVER: Normal echogenicity. No evidence of mass. Hepatopetal flow in the portal vein. BILIARY SYSTEM: The gallbladder is surgically absent. Biliary ductal dilation with the common bile duct measuring up to 9.9 mm in diameter. OTHER: No right upper quadrant ascites. IMPRESSION: 1. Biliary ductal dilation with the common bile duct measuring up to 9.9 mm in diameter. 2. Known pancreatic head lesion is  better seen on same-day CT. consider ERCP/EUS for further evaluation. Electronically signed by: Michaeline Blanch MD 01/14/2024 01:59 PM EST RP Workstation: [id number]           LOS: 3 days   Time spent= 35 mins    Deliliah Room, MD [facility]  If 7PM-7AM, please contact night-coverage  01/16/2024, 8:52 AM  "

## 2024-01-16 NOTE — Progress Notes (Signed)
 Nurse at bedside,IV has become dislodged,IV discontinued,catheter intact.New IV started,22 gauge to right lateral forearm,times one attempt,patient tolerated procedure. Plan of care on going.

## 2024-01-16 NOTE — Plan of Care (Signed)
 Pt is alert and fully oriented x 4, able to ambulate independently in her room. She is afebrile, stable hemodynamically, sinus rhythm on the monitor, normal respiratory effort, no acute distress noted overnight. Pt frequently requests pain med for her right upper quadrant abdominal pain. She is able to rest and sleep after giving Dilaudid  1 mg IV PRN q 3 hrs. Plan of care is reviewed. Pt has been progressing. We will continue to monitor.  Problem: Clinical Measurements: Goal: Ability to maintain clinical measurements within normal limits will improve Outcome: Progressing Goal: Will remain free from infection Outcome: Progressing Goal: Diagnostic test results will improve Outcome: Progressing Goal: Respiratory complications will improve Outcome: Progressing Goal: Cardiovascular complication will be avoided Outcome: Progressing   Problem: Activity: Goal: Risk for activity intolerance will decrease Outcome: Progressing   Problem: Nutrition: Goal: Adequate nutrition will be maintained Outcome: Progressing   Problem: Coping: Goal: Level of anxiety will decrease Outcome: Progressing   Problem: Pain Managment: Goal: General experience of comfort will improve and/or be controlled Outcome: Progressing    Wendi Dash, RN

## 2024-01-17 DIAGNOSIS — K852 Alcohol induced acute pancreatitis without necrosis or infection: Secondary | ICD-10-CM | POA: Diagnosis not present

## 2024-01-17 LAB — HEPATIC FUNCTION PANEL
ALT: 16 U/L (ref 0–44)
AST: 30 U/L (ref 15–41)
Albumin: 3 g/dL — ABNORMAL LOW (ref 3.5–5.0)
Alkaline Phosphatase: 87 U/L (ref 38–126)
Bilirubin, Direct: 0.1 mg/dL (ref 0.0–0.2)
Indirect Bilirubin: 0.1 mg/dL — ABNORMAL LOW (ref 0.3–0.9)
Total Bilirubin: 0.3 mg/dL (ref 0.0–1.2)
Total Protein: 5.7 g/dL — ABNORMAL LOW (ref 6.5–8.1)

## 2024-01-17 LAB — GLUCOSE, CAPILLARY: Glucose-Capillary: 91 mg/dL (ref 70–99)

## 2024-01-17 MED ORDER — FUROSEMIDE 20 MG PO TABS: 20.0000 mg | ORAL_TABLET | ORAL | Status: AC

## 2024-01-17 NOTE — Discharge Summary (Signed)
 " Physician Discharge Summary   Patient: Suzanne Jackson MRN: [id number] DOB: March 29, 1970  Admit date:     01/13/2024  Discharge date: 01/17/2024  Discharge Physician: Deliliah Room   PCP: Leonel Cole, MD   Recommendations at discharge:    F/u with your PCP in one week Outpatient follow up with Dr Burnette on the scheduled appointment. Check BP and heart prior to taking entresto, spironolactone, lasix  and metoprolol .  Discharge Diagnoses: Principal Problem:   Acute pancreatitis Active Problems:   Hypertension   Depression   Chronic HFrEF (heart failure with reduced ejection fraction) (HCC)   COPD (chronic obstructive pulmonary disease) (HCC)   Pancreatic lesion   Hospital Course:   54 y.o. female with medical history significant for alcoholic pancreatitis, CHF, COPD hypertension admitted on 01/13/2023 with acute pancreatitis in the setting of ongoing alcohol  abuse.    Acute alcoholic pancreatitis, POA: Improving symptoms.  Lipase is trending downwards.  CT of the abdomen pelvis with contrast showed complex Oestreich mass in the pancreatic head minimally increased in size, surrounding stranding and fluid, worsening infectious/inflammatory process and worsening acute pancreatitis.  There is evidence of pancreatic duct and biliary ductal dilatation. -Advanced diet from liquids to full liquids on 01/06/2024. Advanced to a soft diet on 01/17/24 and patient was able to tolerate that.    Complex cystic pancreatic mass, POA: GI consulted.  No acute interventions needed.  Outpatient follow-up with GI, Dr Burnette on 01/23/2024 for EUS.   Alcoholic abuse, POA: Continue thiamine  and folate. Advised to stop drinking alcohol .Substance abuse resources added to AVS by TOC.   History of ventricular thrombus, POA: Continue with full anticoagulation with Eliquis    Chronic systolic heart failure, POA: Not in exacerbation.  Last echo done on 09/23/2023 showed EF of less than 20%.  She follows up  outpatient with [facility]ologist at [facility]. Restarted spironolactone 25 mg daily on 01/06/2024.  Resumed  metoprolol  succinate, Farxiga as well as Entresto on discharge. Strictly advised to check BP and heart rate prior to taking these medications.   Transaminitis in the setting of alcohol  abuse.  Follow closely.   Hypertension, continue home meds   Disposition: Lives at home with her husband.  She is independent in activities of daily life.      Consultants: GI Procedures performed: none  Disposition: Home Diet recommendation:  Cardiac diet DISCHARGE MEDICATION: Allergies as of 01/17/2024       Reactions   Aleve [naproxen] Anaphylaxis       Aspirin Anaphylaxis, Shortness Of Breath   Ibuprofen Anaphylaxis   Oxycodone -acetaminophen  Itching, Other (See Comments)   Pt states okay to take with hydroxyzine    Bupropion Other (See Comments)   Anger        Medication List     TAKE these medications    albuterol  108 (90 Base) MCG/ACT inhaler Commonly known as: VENTOLIN  HFA Inhale 2 puffs into the lungs 2 (two) times daily.   albuterol  (2.5 MG/3ML) 0.083% nebulizer solution Commonly known as: PROVENTIL  Take 3 mLs by nebulization every 6 (six) hours as needed for wheezing or shortness of breath.   DULoxetine  60 MG capsule Commonly known as: CYMBALTA  Take 60 mg by mouth daily.   Eliquis  5 MG Tabs tablet Generic drug: apixaban  Take 5 mg by mouth 2 (two) times daily.   Farxiga 10 MG Tabs tablet Generic drug: dapagliflozin propanediol Take 10 mg by mouth daily.   fexofenadine 180 MG tablet Commonly known as: ALLEGRA Take 180 mg by mouth daily.  folic acid  1 MG tablet Commonly known as: FOLVITE  Take 1 tablet (1 mg total) by mouth daily.   furosemide  20 MG tablet Commonly known as: LASIX  Take 1 tablet (20 mg total) by mouth every other day.   HYDROcodone -acetaminophen  7.5-325 MG tablet Commonly known as: NORCO Take 1 tablet by mouth every four to six hours as needed  for pain   hydrOXYzine  50 MG tablet Commonly known as: ATARAX  Take 1 tablet (50 mg total) by mouth every 6 (six) hours as needed (refractory itching).   metoprolol  succinate 50 MG 24 hr tablet Commonly known as: TOPROL -XL Take 50 mg by mouth daily.   montelukast  10 MG tablet Commonly known as: SINGULAIR  Take 10 mg by mouth at bedtime.   sacubitril-valsartan 24-26 MG Commonly known as: ENTRESTO Take 1 tablet by mouth 2 (two) times daily.   spironolactone 25 MG tablet Commonly known as: ALDACTONE Take 25 mg by mouth daily.   thiamine  100 MG tablet Commonly known as: VITAMIN B1 Take 1 tablet (100 mg total) by mouth daily.   traZODone 50 MG tablet Commonly known as: DESYREL Take 50 mg by mouth at bedtime as needed for sleep.   Trelegy Ellipta 100-62.5-25 MCG/ACT Aepb Generic drug: Fluticasone -Umeclidin-Vilant Take 1 puff by mouth daily.   Tylenol  8 Hour 650 MG CR tablet Generic drug: acetaminophen  Take 650-1,300 mg by mouth every 8 (eight) hours as needed for pain.   Vitamin D (Ergocalciferol) 1.25 MG (50000 UNIT) Caps capsule Commonly known as: DRISDOL Take 50,000 Units by mouth once a week.        Follow-up Information     [facility] ( Outpatient treatment plan). Call.   Contact information: ( OutPatient treatment Plan) [address] [place], [place] Walk in - (Mon- Fri) 5:30am-11AM Schedule appointment [phone]        Leonel Cole, MD. Schedule an appointment as soon as possible for a visit in 1 week(s).   Specialty: Family Medicine Contact information: [address] [place] [address] [phone]         Burnette Fallow, MD. Schedule an appointment as soon as possible for a visit in 1 week(s).   Specialty: Gastroenterology Contact information: [address] [place] [address] [phone]                Discharge Exam: Fredricka Weights   01/13/24 1759 01/14/24 1719 01/17/24 0456  Weight:  53.5 kg 56.6 kg 56.7 kg   Constitutional: NAD, calm, comfortable Eyes: PERRL, lids and conjunctivae normal ENMT: Mucous membranes are moist. Posterior pharynx clear of any exudate or lesions.Normal dentition.  Neck: normal, supple, no masses, no thyromegaly Respiratory: clear to auscultation bilaterally, no wheezing, no crackles. Normal respiratory effort. No accessory muscle use.  Cardiovascular: Regular rate and rhythm, no murmurs / rubs / gallops. No extremity edema. 2+ pedal pulses. No carotid bruits.  Abdomen: no tenderness, no masses palpated. No hepatosplenomegaly. Bowel sounds positive.  Musculoskeletal: no clubbing / cyanosis. No joint deformity upper and lower extremities. Good ROM, no contractures. Normal muscle tone.  Skin: no rashes, lesions, ulcers. No induration Neurologic: CN 2-12 grossly intact. Sensation intact, DTR normal. Strength 5/5 x all 4 extremities.  Psychiatric: Normal judgment and insight. Alert and oriented x 3. Normal mood.    Condition at discharge: good  The results of significant diagnostics from this hospitalization (including imaging, microbiology, ancillary and laboratory) are listed below for reference.   Imaging Studies: DG Chest 1 View Result Date: 01/16/2024  CLINICAL DATA:  Shortness of breath. Congestive dilated cardiomyopathy. EXAM: CHEST  1 VIEW COMPARISON:  01/14/2024 FINDINGS: Slightly improved lung volumes. Cardiomegaly is stable. Unchanged mediastinal contours with aortic atherosclerosis. Improving pleural effusions with mild residual. Mild persistent atelectasis at the lung bases. Improving vascular congestion. No pneumothorax. IMPRESSION: 1. Improving pleural effusions and vascular congestion. 2. Stable cardiomegaly. 3. Mild persistent atelectasis at the lung bases. Electronically Signed   By: Andrea Gasman M.D.   On: 01/16/2024 11:05   DG CHEST PORT 1 VIEW Result Date: 01/14/2024 CLINICAL DATA:  Tachypnea. EXAM: PORTABLE CHEST 1 VIEW  COMPARISON:  10/02/2023 FINDINGS: Low lung volumes limit assessment. The heart is enlarged. Hazy bilateral opacities consistent with pleural effusions, right greater than left. Bronchovascular crowding versus central vascular congestion. No pneumothorax. Gaseous gastric distention in the upper abdomen. IMPRESSION: 1. Low lung volumes limit assessment. 2. Cardiomegaly with bilateral pleural effusions, right greater than left. 3. Bronchovascular crowding versus central vascular congestion. Electronically Signed   By: Andrea Gasman M.D.   On: 01/14/2024 19:07   US  Abdomen Limited Result Date: 01/14/2024 EXAM: Right Upper Quadrant Abdominal Ultrasound 01/14/2024 12:14:45 PM TECHNIQUE: Real-time ultrasonography of the right upper quadrant of the abdomen was performed. COMPARISON: CT dated 01/13/2024. CLINICAL HISTORY: Elevated LFTs. FINDINGS: LIVER: Normal echogenicity. No evidence of mass. Hepatopetal flow in the portal vein. BILIARY SYSTEM: The gallbladder is surgically absent. Biliary ductal dilation with the common bile duct measuring up to 9.9 mm in diameter. OTHER: No right upper quadrant ascites. IMPRESSION: 1. Biliary ductal dilation with the common bile duct measuring up to 9.9 mm in diameter. 2. Known pancreatic head lesion is better seen on same-day CT. consider ERCP/EUS for further evaluation. Electronically signed by: Michaeline Blanch MD 01/14/2024 01:59 PM EST RP Workstation: [id number]   CT ABDOMEN PELVIS W CONTRAST Result Date: 01/13/2024 EXAM: CT ABDOMEN AND PELVIS WITH CONTRAST 01/13/2024 07:47:19 PM TECHNIQUE: CT of the abdomen and pelvis was performed with the administration of 80 mL of iohexol  (OMNIPAQUE ) 300 MG/ML solution. Multiplanar reformatted images are provided for review. Automated exposure control, iterative reconstruction, and/or weight-based adjustment of the mA/kV was utilized to reduce the radiation dose to as low as reasonably achievable. COMPARISON: CT abdomen and pelvis  10/02/2023. CLINICAL HISTORY: RLQ abdominal pain. FINDINGS: LOWER CHEST: There are new patchy ground glass opacities in both lung bases. LIVER: The liver is mildly enlarged. GALLBLADDER AND BILE DUCTS: There is intrahepatic and extrahepatic biliary ductal dilatation which has increased from prior study. SPLEEN: No acute abnormality. PANCREAS: Complex multiloculated cystic mass is seen in the region of the pancreatic head measuring 3.5 x 5.9 cm, minimally increased in size. There is surrounding inflammatory stranding and fluid which has increased. There is new mild pancreatic ductal dilatation in the body of tail of the pancreas. ADRENAL GLANDS: No acute abnormality. KIDNEYS, URETERS AND BLADDER: There is a subcentimeter hypodensity in the left kidney which is too small to characterize, likely a cyst. There is scarring in the superior pole of the left kidney. No stones in the kidneys or ureters. No hydronephrosis. No perinephric or periureteral stranding. Urinary bladder is unremarkable. GI AND BOWEL: There is wall thickening and inflammation involving the proximal transverse colon abutting inflammatory fluid related to the pancreas. There is some new wall thickening of the duodenum. The appendix is not visualized. Stomach demonstrates no acute abnormality. There is no bowel obstruction. PERITONEUM AND RETROPERITONEUM: There is a new small amount of free fluid in the pelvis. No free air. VASCULATURE: Aorta  is normal in caliber. There are atherosclerotic calcifications of the aorta and iliac arteries. LYMPH NODES: No lymphadenopathy. REPRODUCTIVE ORGANS: No acute abnormality. BONES AND SOFT TISSUES: Lower lumbar fusion hardware is unchanged. There is a small fat containing umbilical hernia. No acute osseous abnormality. No focal soft tissue abnormality. IMPRESSION: 1. Complex multiloculated cystic mass in the region of the pancreatic head, minimally increased in size, with increased surrounding inflammatory stranding  and fluid. Findings may be related to worsening infectious/inflammatory process and worsening acute pancreatitis. Cystic neoplasm of the pancreatic head not excluded. There is increasing pancreatic ductal and biliary ductal dilatation. Recommend EUS with possible FNA and surgical consultation. 2. Wall thickening and inflammation involving the proximal transverse colon abutting inflammatory fluid related to the pancreas, with new wall thickening of the duodenum. 3. New patchy ground glass opacities in both lung bases. Electronically signed by: Greig Pique MD 01/13/2024 08:28 PM EST RP Workstation: [id number]    Microbiology: Results for orders placed or performed during the hospital encounter of 01/13/24  Resp panel by RT-PCR (RSV, Flu A&B, Covid) Anterior Nasal Swab     Status: None   Collection Time: 01/13/24  6:06 PM   Specimen: Anterior Nasal Swab  Result Value Ref Range Status   SARS Coronavirus 2 by RT PCR NEGATIVE NEGATIVE Final    Comment: (NOTE) SARS-CoV-2 target nucleic acids are NOT DETECTED.  The SARS-CoV-2 RNA is generally detectable in upper respiratory specimens during the acute phase of infection. The lowest concentration of SARS-CoV-2 viral copies this assay can detect is 138 copies/mL. A negative result does not preclude SARS-Cov-2 infection and should not be used as the sole basis for treatment or other patient management decisions. A negative result may occur with  improper specimen collection/handling, submission of specimen other than nasopharyngeal swab, presence of viral mutation(s) within the areas targeted by this assay, and inadequate number of viral copies(<138 copies/mL). A negative result must be combined with clinical observations, patient history, and epidemiological information. The expected result is Negative.  Fact Sheet for Patients:  bloggercourse.com  Fact Sheet for Healthcare Providers:   seriousbroker.it  This test is no t yet approved or cleared by the [place]  FDA and  has been authorized for detection and/or diagnosis of SARS-CoV-2 by FDA under an Emergency Use Authorization (EUA). This EUA will remain  in effect (meaning this test can be used) for the duration of the COVID-19 declaration under Section 564(b)(1) of the Act, 21 U.S.C.section 360bbb-3(b)(1), unless the authorization is terminated  or revoked sooner.       Influenza A by PCR NEGATIVE NEGATIVE Final   Influenza B by PCR NEGATIVE NEGATIVE Final    Comment: (NOTE) The Xpert Xpress SARS-CoV-2/FLU/RSV plus assay is intended as an aid in the diagnosis of influenza from Nasopharyngeal swab specimens and should not be used as a sole basis for treatment. Nasal washings and aspirates are unacceptable for Xpert Xpress SARS-CoV-2/FLU/RSV testing.  Fact Sheet for Patients: bloggercourse.com  Fact Sheet for Healthcare Providers: seriousbroker.it  This test is not yet approved or cleared by the [place]  FDA and has been authorized for detection and/or diagnosis of SARS-CoV-2 by FDA under an Emergency Use Authorization (EUA). This EUA will remain in effect (meaning this test can be used) for the duration of the COVID-19 declaration under Section 564(b)(1) of the Act, 21 U.S.C. section 360bbb-3(b)(1), unless the authorization is terminated or revoked.     Resp Syncytial Virus by PCR NEGATIVE NEGATIVE Final    Comment: (NOTE)  Fact Sheet for Patients: bloggercourse.com  Fact Sheet for Healthcare Providers: seriousbroker.it  This test is not yet approved or cleared by the [place]  FDA and has been authorized for detection and/or diagnosis of SARS-CoV-2 by FDA under an Emergency Use Authorization (EUA). This EUA will remain in effect (meaning this test can be used) for  the duration of the COVID-19 declaration under Section 564(b)(1) of the Act, 21 U.S.C. section 360bbb-3(b)(1), unless the authorization is terminated or revoked.  Performed at [facility], [address], [place], [place] [address]     Labs: CBC: Recent Labs  Lab 01/13/24 1816 01/14/24 0702 01/16/24 0401  WBC 16.3* 19.3* 16.5*  HGB 14.4 13.1 11.0*  HCT 44.5 40.9 34.5*  MCV 93.5 95.8 96.4  PLT 321 263 244   Basic Metabolic Panel: Recent Labs  Lab 01/13/24 1816 01/14/24 0702 01/16/24 0401  NA 134* 136 135  K 4.1 4.3 3.7  CL 98 100 97*  CO2 20* 26 24  GLUCOSE 109* 112* 69*  BUN 21* 13 7  CREATININE 1.05* 0.68 0.32*  CALCIUM  9.5 9.3 9.3  PHOS  --   --  2.1*   Liver Function Tests: Recent Labs  Lab 01/13/24 1816 01/14/24 0702 01/15/24 1525 01/16/24 0401 01/17/24 0407  AST 27 52* 21  --  30  ALT 13 38 17  --  16  ALKPHOS 125 129* 107  --  87  BILITOT 0.9 0.7 0.6  --  0.3  PROT 7.5 6.7 6.2*  --  5.7*  ALBUMIN 4.3 3.7 3.3* 3.2* 3.0*   CBG: Recent Labs  Lab 01/16/24 0006 01/16/24 0815 01/16/24 1632 01/16/24 2355 01/17/24 0734  GLUCAP 90 89 81 101* 91    Discharge time spent: 40 minutes.  Signed: Deliliah Room, MD [facility] 01/17/2024 "

## 2024-01-17 NOTE — Progress Notes (Signed)
 Pt discharged

## 2024-01-17 NOTE — Discharge Instructions (Addendum)
 Take your cardiac medications (entresto, lasix , spironolactone, metoprolol ) after checking your blood pressure and heart rate. If Systolic BP is 100 mmHg or less, please don't take these medications.

## 2024-01-17 NOTE — Plan of Care (Signed)

## 2024-01-17 NOTE — Progress Notes (Signed)
 Patient is being discharge. Paperwork has been printed. IV has been removed with no complications. Tele has been removed and placed in the correct location. No questions or concerns at this time.

## 2024-01-21 NOTE — Progress Notes (Signed)
 Patient for endo 01/23/24 was d/c from hospital 1/1, was going to call for pre call but looking through notes some question about rescheduling endo procedure. I checked with GI team who saw her while inpt and they messaged Dr Burnette. Per Kennedy NP she checked with [facility] and he replied  he recommends postponing the EUS by 8 weeks and he would like to see her in the office prior to rescheduling the EUS. I will remove her from schedule, [place] to message office to call and reschedule patient.

## 2024-01-23 ENCOUNTER — Ambulatory Visit (HOSPITAL_COMMUNITY): Admission: RE | Admit: 2024-01-23 | Source: Home / Self Care | Admitting: Gastroenterology

## 2024-01-23 ENCOUNTER — Encounter (HOSPITAL_COMMUNITY): Admission: RE | Payer: Self-pay | Source: Home / Self Care

## 2024-01-23 SURGERY — ULTRASOUND, UPPER GI TRACT, ENDOSCOPIC
Anesthesia: Monitor Anesthesia Care

## 2024-01-31 ENCOUNTER — Other Ambulatory Visit: Payer: Self-pay | Admitting: Gastroenterology

## 2024-03-19 ENCOUNTER — Ambulatory Visit (HOSPITAL_COMMUNITY): Admit: 2024-03-19 | Admitting: Gastroenterology

## 2024-03-19 ENCOUNTER — Encounter (HOSPITAL_COMMUNITY): Payer: Self-pay

## 2024-03-19 SURGERY — ULTRASOUND, UPPER GI TRACT, ENDOSCOPIC
Anesthesia: Monitor Anesthesia Care
